# Patient Record
Sex: Female | Born: 1970 | Race: Black or African American | Hispanic: No | Smoking: Never smoker
Health system: Southern US, Community
[De-identification: ages and names within clinical notes are randomized; demographics above are authoritative.]

## PROBLEM LIST (undated history)

## (undated) DIAGNOSIS — F410 Panic disorder [episodic paroxysmal anxiety] without agoraphobia: Secondary | ICD-10-CM

## (undated) DIAGNOSIS — I10 Essential (primary) hypertension: Secondary | ICD-10-CM

## (undated) DIAGNOSIS — D219 Benign neoplasm of connective and other soft tissue, unspecified: Secondary | ICD-10-CM

## (undated) DIAGNOSIS — R87629 Unspecified abnormal cytological findings in specimens from vagina: Secondary | ICD-10-CM

## (undated) DIAGNOSIS — E669 Obesity, unspecified: Secondary | ICD-10-CM

## (undated) DIAGNOSIS — F419 Anxiety disorder, unspecified: Secondary | ICD-10-CM

## (undated) DIAGNOSIS — B999 Unspecified infectious disease: Secondary | ICD-10-CM

## (undated) DIAGNOSIS — E785 Hyperlipidemia, unspecified: Secondary | ICD-10-CM

## (undated) DIAGNOSIS — D649 Anemia, unspecified: Secondary | ICD-10-CM

## (undated) HISTORY — DX: Panic disorder (episodic paroxysmal anxiety): F41.0

## (undated) HISTORY — PX: ABDOMINAL HYSTERECTOMY: SHX81

## (undated) HISTORY — DX: Obesity, unspecified: E66.9

## (undated) HISTORY — DX: Anxiety disorder, unspecified: F41.9

## (undated) HISTORY — DX: Hyperlipidemia, unspecified: E78.5

## (undated) HISTORY — PX: KNEE SURGERY: SHX244

---

## 1998-06-28 ENCOUNTER — Emergency Department (HOSPITAL_COMMUNITY): Admission: EM | Admit: 1998-06-28 | Discharge: 1998-06-28 | Payer: Self-pay | Admitting: Emergency Medicine

## 1998-09-15 ENCOUNTER — Emergency Department (HOSPITAL_COMMUNITY): Admission: EM | Admit: 1998-09-15 | Discharge: 1998-09-15 | Payer: Self-pay | Admitting: Emergency Medicine

## 1998-10-08 ENCOUNTER — Other Ambulatory Visit: Admission: RE | Admit: 1998-10-08 | Discharge: 1998-10-08 | Payer: Self-pay | Admitting: Obstetrics

## 1998-10-27 ENCOUNTER — Inpatient Hospital Stay (HOSPITAL_COMMUNITY): Admission: AD | Admit: 1998-10-27 | Discharge: 1998-10-27 | Payer: Self-pay | Admitting: Obstetrics

## 1998-12-03 ENCOUNTER — Emergency Department (HOSPITAL_COMMUNITY): Admission: EM | Admit: 1998-12-03 | Discharge: 1998-12-03 | Payer: Self-pay

## 1999-04-18 ENCOUNTER — Inpatient Hospital Stay (HOSPITAL_COMMUNITY): Admission: AD | Admit: 1999-04-18 | Discharge: 1999-04-20 | Payer: Self-pay | Admitting: Obstetrics

## 1999-09-09 ENCOUNTER — Emergency Department (HOSPITAL_COMMUNITY): Admission: EM | Admit: 1999-09-09 | Discharge: 1999-09-09 | Payer: Self-pay | Admitting: *Deleted

## 2000-01-05 ENCOUNTER — Emergency Department (HOSPITAL_COMMUNITY): Admission: EM | Admit: 2000-01-05 | Discharge: 2000-01-05 | Payer: Self-pay | Admitting: Internal Medicine

## 2001-05-22 ENCOUNTER — Encounter: Payer: Self-pay | Admitting: Obstetrics

## 2001-05-22 ENCOUNTER — Ambulatory Visit (HOSPITAL_COMMUNITY): Admission: RE | Admit: 2001-05-22 | Discharge: 2001-05-22 | Payer: Self-pay | Admitting: Obstetrics

## 2001-08-14 ENCOUNTER — Ambulatory Visit (HOSPITAL_COMMUNITY): Admission: RE | Admit: 2001-08-14 | Discharge: 2001-08-14 | Payer: Self-pay | Admitting: Obstetrics

## 2001-08-14 ENCOUNTER — Encounter: Payer: Self-pay | Admitting: Obstetrics

## 2001-08-17 ENCOUNTER — Inpatient Hospital Stay (HOSPITAL_COMMUNITY): Admission: AD | Admit: 2001-08-17 | Discharge: 2001-08-19 | Payer: Self-pay | Admitting: Obstetrics

## 2002-07-05 ENCOUNTER — Emergency Department (HOSPITAL_COMMUNITY): Admission: EM | Admit: 2002-07-05 | Discharge: 2002-07-05 | Payer: Self-pay | Admitting: Emergency Medicine

## 2003-07-11 ENCOUNTER — Emergency Department (HOSPITAL_COMMUNITY): Admission: EM | Admit: 2003-07-11 | Discharge: 2003-07-11 | Payer: Self-pay | Admitting: Emergency Medicine

## 2006-01-18 ENCOUNTER — Emergency Department (HOSPITAL_COMMUNITY): Admission: EM | Admit: 2006-01-18 | Discharge: 2006-01-18 | Payer: Self-pay | Admitting: Emergency Medicine

## 2006-05-08 ENCOUNTER — Emergency Department (HOSPITAL_COMMUNITY): Admission: EM | Admit: 2006-05-08 | Discharge: 2006-05-08 | Payer: Self-pay | Admitting: Emergency Medicine

## 2006-08-10 ENCOUNTER — Emergency Department (HOSPITAL_COMMUNITY): Admission: EM | Admit: 2006-08-10 | Discharge: 2006-08-10 | Payer: Self-pay | Admitting: Emergency Medicine

## 2006-09-30 ENCOUNTER — Inpatient Hospital Stay (HOSPITAL_COMMUNITY): Admission: EM | Admit: 2006-09-30 | Discharge: 2006-10-02 | Payer: Self-pay | Admitting: Emergency Medicine

## 2007-02-13 ENCOUNTER — Emergency Department (HOSPITAL_COMMUNITY): Admission: EM | Admit: 2007-02-13 | Discharge: 2007-02-13 | Payer: Self-pay | Admitting: Emergency Medicine

## 2007-06-13 ENCOUNTER — Emergency Department (HOSPITAL_COMMUNITY): Admission: EM | Admit: 2007-06-13 | Discharge: 2007-06-13 | Payer: Self-pay | Admitting: Family Medicine

## 2007-10-24 ENCOUNTER — Emergency Department (HOSPITAL_COMMUNITY): Admission: EM | Admit: 2007-10-24 | Discharge: 2007-10-24 | Payer: Self-pay | Admitting: Emergency Medicine

## 2007-10-30 ENCOUNTER — Emergency Department (HOSPITAL_COMMUNITY): Admission: EM | Admit: 2007-10-30 | Discharge: 2007-10-30 | Payer: Self-pay | Admitting: Emergency Medicine

## 2007-12-01 ENCOUNTER — Emergency Department (HOSPITAL_COMMUNITY): Admission: EM | Admit: 2007-12-01 | Discharge: 2007-12-01 | Payer: Self-pay | Admitting: Emergency Medicine

## 2008-03-09 IMAGING — CR DG KNEE COMPLETE 4+V*R*
4 series · 4 of 4 positions shown · non-contrast
Comparison: None.

Exam: Right knee, 4 views.

HISTORY: Right knee pain.

[t knee ap right *]
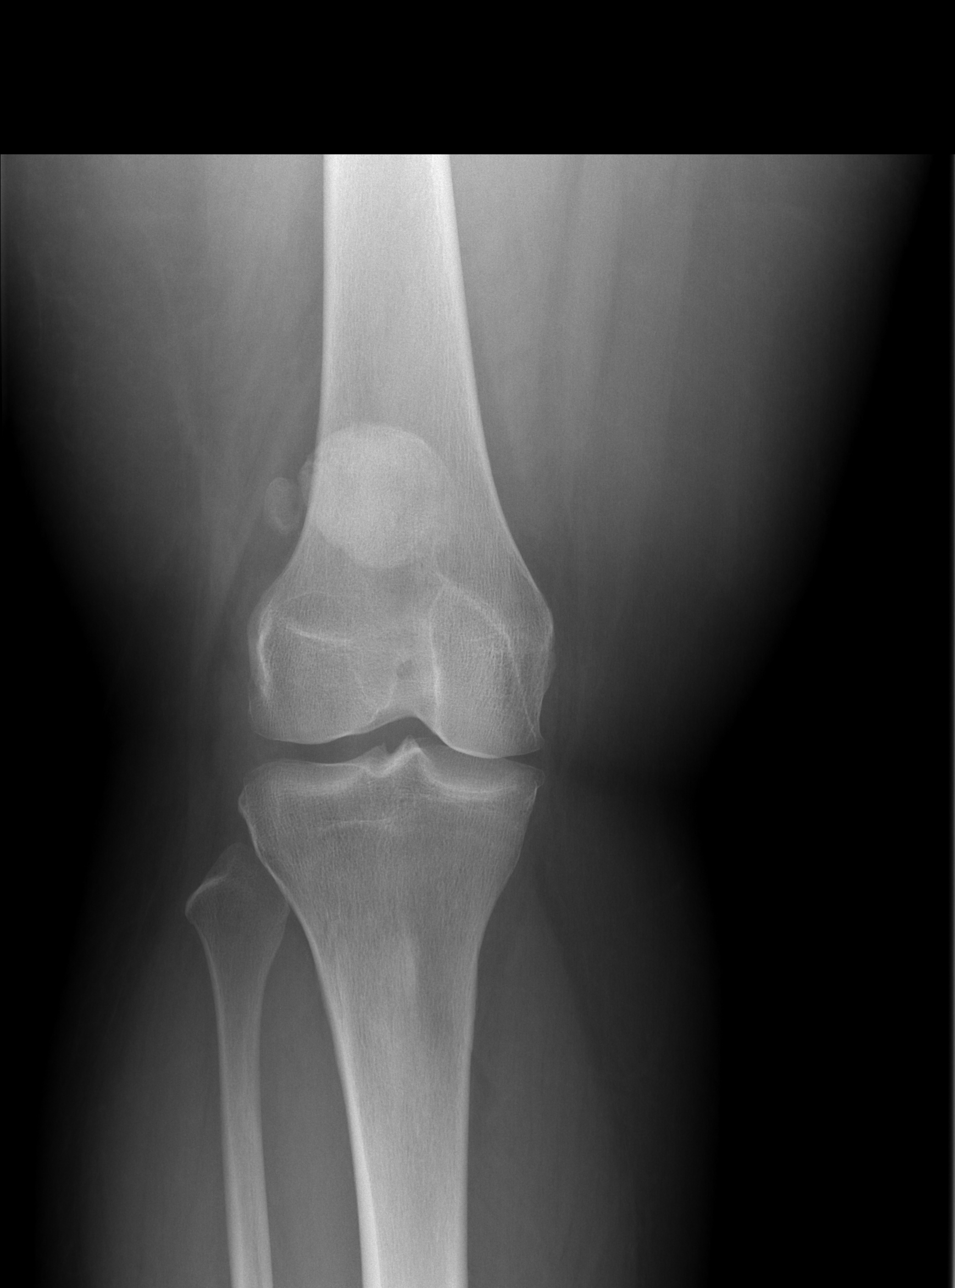

[t knee oblique right * (1 of 2)]
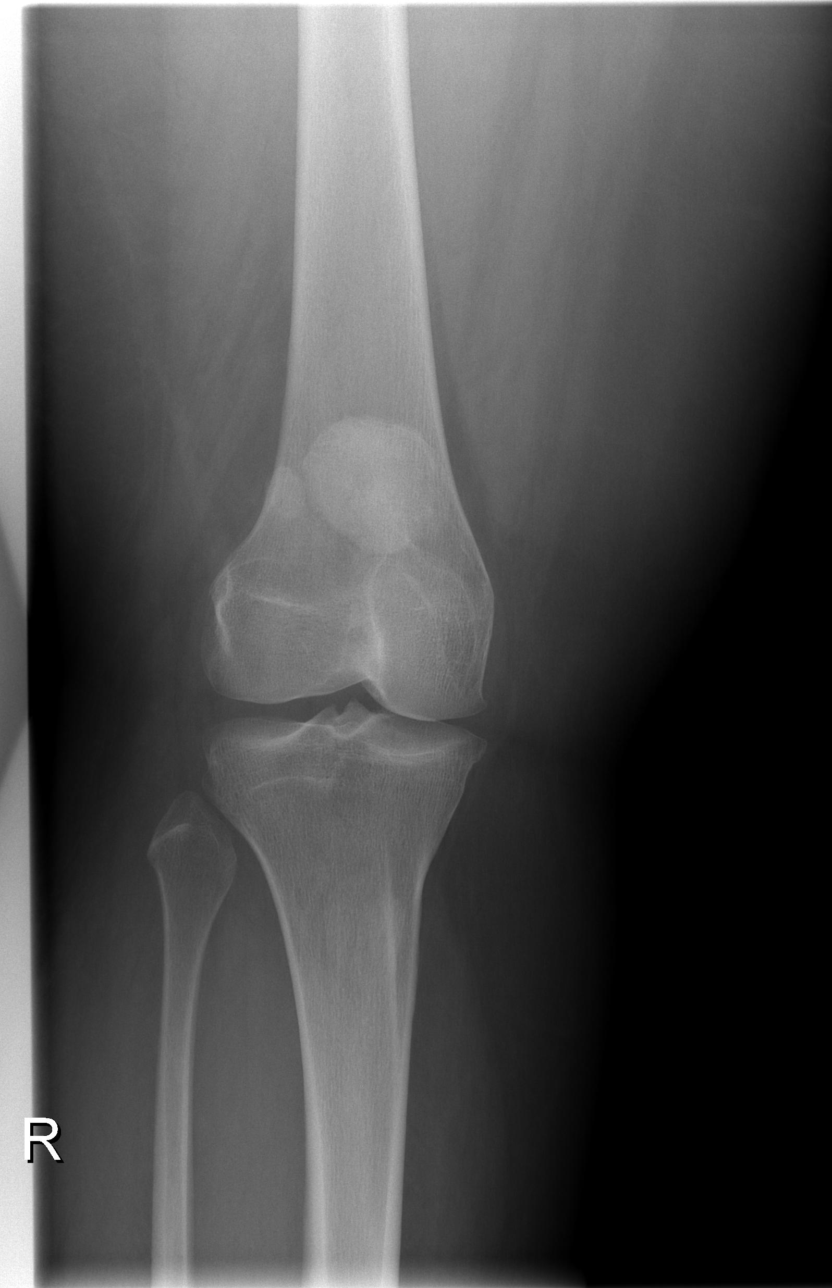

[t knee oblique right * (2 of 2)]
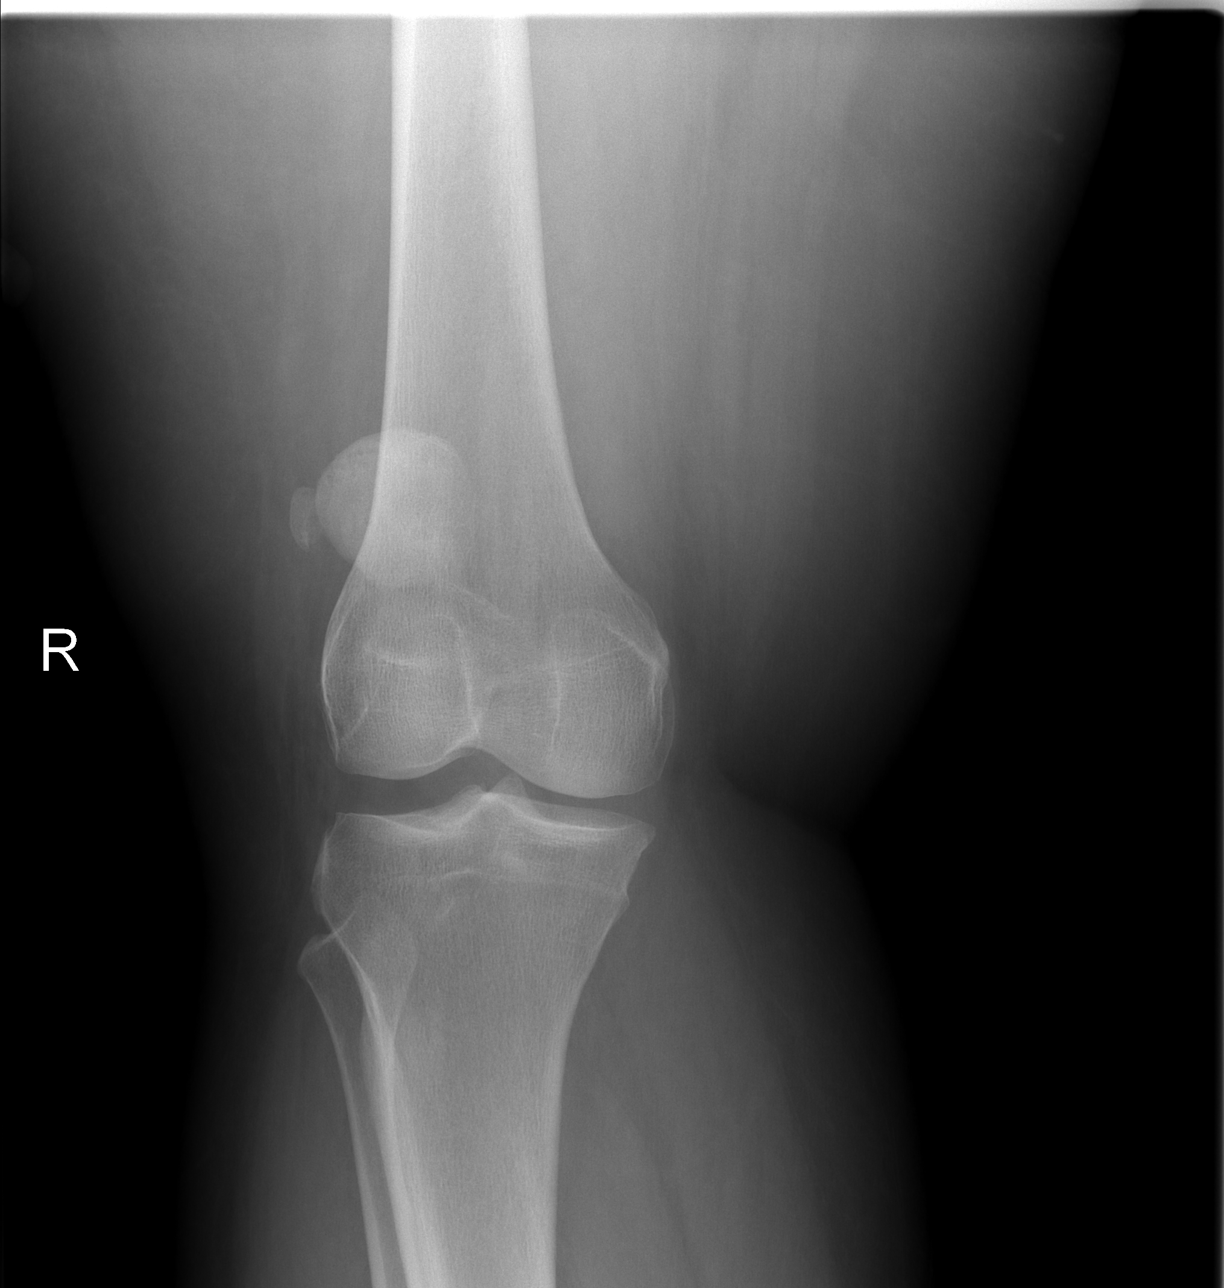

[view not recorded]
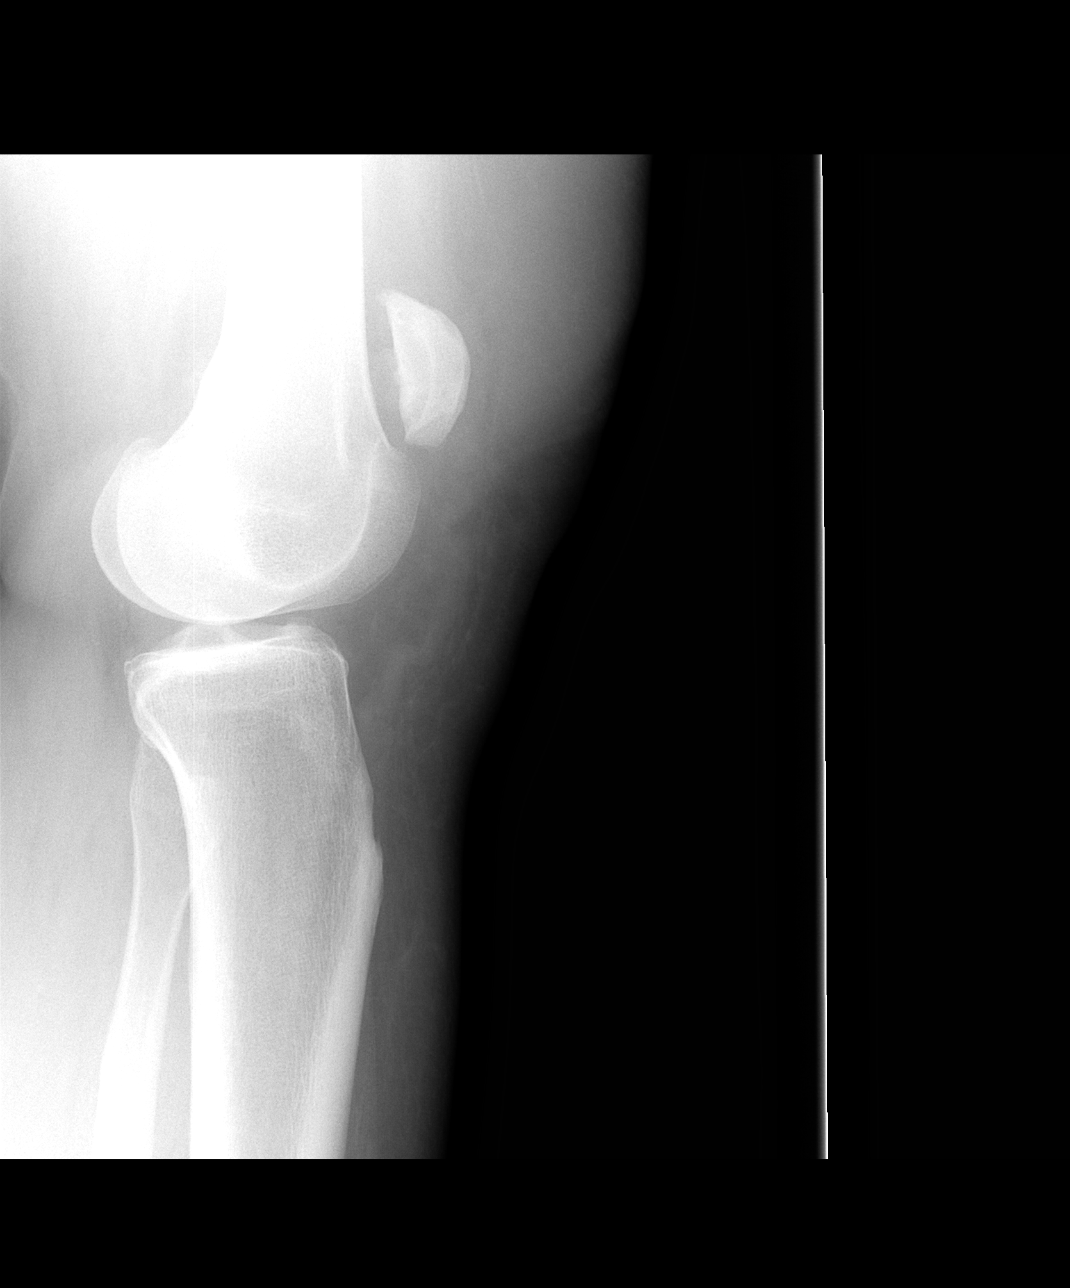

[4 of 4 positions shown; findings below may reference images not displayed]

FINDINGS: Patellar tendon appears disrupted. The patella is superiorly displaced.

No acute fractures or dislocations noted.

There is a bipartite patella.
IMPRESSION: 1. Findings are suspicious for a patellar tendon rupture.
2. No fractures.
3. Bipartite patella.

## 2008-10-14 ENCOUNTER — Encounter: Admission: RE | Admit: 2008-10-14 | Discharge: 2008-10-14 | Payer: Self-pay | Admitting: Orthopedic Surgery

## 2008-10-30 ENCOUNTER — Encounter: Admission: RE | Admit: 2008-10-30 | Discharge: 2009-01-28 | Payer: Self-pay | Admitting: Orthopedic Surgery

## 2009-07-01 ENCOUNTER — Encounter: Admission: RE | Admit: 2009-07-01 | Discharge: 2009-07-01 | Payer: Self-pay | Admitting: Orthopedic Surgery

## 2010-03-06 ENCOUNTER — Emergency Department (HOSPITAL_COMMUNITY): Admission: EM | Admit: 2010-03-06 | Discharge: 2010-03-06 | Payer: Self-pay | Admitting: Emergency Medicine

## 2010-03-09 ENCOUNTER — Emergency Department (HOSPITAL_COMMUNITY): Admission: EM | Admit: 2010-03-09 | Discharge: 2010-03-09 | Payer: Self-pay | Admitting: Emergency Medicine

## 2010-07-17 ENCOUNTER — Emergency Department (HOSPITAL_COMMUNITY)
Admission: EM | Admit: 2010-07-17 | Discharge: 2010-07-17 | Payer: Self-pay | Source: Home / Self Care | Admitting: Emergency Medicine

## 2010-07-20 LAB — DIFFERENTIAL
Basophils Relative: 0 % (ref 0–1)
Eosinophils Relative: 1 % (ref 0–5)
Monocytes Absolute: 0.8 10*3/uL (ref 0.1–1.0)
Neutrophils Relative %: 53 % (ref 43–77)

## 2010-07-20 LAB — CBC
HCT: 32.6 % — ABNORMAL LOW (ref 36.0–46.0)
Hemoglobin: 9.3 g/dL — ABNORMAL LOW (ref 12.0–15.0)
RBC: 5.23 MIL/uL — ABNORMAL HIGH (ref 3.87–5.11)
WBC: 10.2 10*3/uL (ref 4.0–10.5)

## 2010-08-19 ENCOUNTER — Emergency Department (HOSPITAL_COMMUNITY)
Admission: EM | Admit: 2010-08-19 | Discharge: 2010-08-19 | Disposition: A | Discharge status: Home / Self Care | Payer: Self-pay | Attending: Emergency Medicine | Admitting: Emergency Medicine

## 2010-08-19 DIAGNOSIS — R197 Diarrhea, unspecified: Secondary | ICD-10-CM | POA: Insufficient documentation

## 2010-08-19 DIAGNOSIS — R630 Anorexia: Secondary | ICD-10-CM | POA: Insufficient documentation

## 2010-08-19 DIAGNOSIS — R110 Nausea: Secondary | ICD-10-CM | POA: Insufficient documentation

## 2010-08-19 DIAGNOSIS — Z79899 Other long term (current) drug therapy: Secondary | ICD-10-CM | POA: Insufficient documentation

## 2010-08-19 DIAGNOSIS — R6889 Other general symptoms and signs: Secondary | ICD-10-CM | POA: Insufficient documentation

## 2010-08-19 DIAGNOSIS — R509 Fever, unspecified: Secondary | ICD-10-CM | POA: Insufficient documentation

## 2010-08-19 DIAGNOSIS — J3489 Other specified disorders of nose and nasal sinuses: Secondary | ICD-10-CM | POA: Insufficient documentation

## 2010-08-19 DIAGNOSIS — E119 Type 2 diabetes mellitus without complications: Secondary | ICD-10-CM | POA: Insufficient documentation

## 2010-08-19 DIAGNOSIS — R51 Headache: Secondary | ICD-10-CM | POA: Insufficient documentation

## 2010-08-19 DIAGNOSIS — IMO0001 Reserved for inherently not codable concepts without codable children: Secondary | ICD-10-CM | POA: Insufficient documentation

## 2010-08-19 LAB — CBC
HCT: 33.2 % — ABNORMAL LOW (ref 36.0–46.0)
Hemoglobin: 9.1 g/dL — ABNORMAL LOW (ref 12.0–15.0)
RDW: 19.6 % — ABNORMAL HIGH (ref 11.5–15.5)
WBC: 7.4 10*3/uL (ref 4.0–10.5)

## 2010-08-19 LAB — URINALYSIS, ROUTINE W REFLEX MICROSCOPIC
Hgb urine dipstick: NEGATIVE
Specific Gravity, Urine: 1.024 (ref 1.005–1.030)
Urine Glucose, Fasting: NEGATIVE mg/dL

## 2010-08-19 LAB — DIFFERENTIAL
Basophils Absolute: 0.1 10*3/uL (ref 0.0–0.1)
Eosinophils Relative: 2 % (ref 0–5)
Monocytes Absolute: 0.8 10*3/uL (ref 0.1–1.0)
Monocytes Relative: 11 % (ref 3–12)
Neutrophils Relative %: 48 % (ref 43–77)

## 2010-08-19 LAB — POCT I-STAT, CHEM 8
BUN: 8 mg/dL (ref 6–23)
Chloride: 105 mEq/L (ref 96–112)
Creatinine, Ser: 0.6 mg/dL (ref 0.4–1.2)
Potassium: 4.1 mEq/L (ref 3.5–5.1)
Sodium: 140 mEq/L (ref 135–145)

## 2010-08-19 LAB — LIPASE, BLOOD: Lipase: 39 U/L (ref 11–59)

## 2010-09-09 LAB — URINALYSIS, ROUTINE W REFLEX MICROSCOPIC
Bilirubin Urine: NEGATIVE
Glucose, UA: NEGATIVE mg/dL
Ketones, ur: NEGATIVE mg/dL
Nitrite: NEGATIVE
Nitrite: NEGATIVE
Protein, ur: NEGATIVE mg/dL
Protein, ur: NEGATIVE mg/dL
Urobilinogen, UA: 1 mg/dL (ref 0.0–1.0)
pH: 6.5 (ref 5.0–8.0)

## 2010-09-09 LAB — GLUCOSE, CAPILLARY: Glucose-Capillary: 174 mg/dL — ABNORMAL HIGH (ref 70–99)

## 2010-09-09 LAB — WET PREP, GENITAL
Clue Cells Wet Prep HPF POC: NONE SEEN
Trich, Wet Prep: NONE SEEN

## 2010-09-09 LAB — URINE CULTURE: Colony Count: 40000

## 2010-09-09 LAB — GC/CHLAMYDIA PROBE AMP, GENITAL

## 2010-11-12 NOTE — Op Note (Signed)
NAMECARRIEANN, SPIELBERG NO.:  [id number]   MEDICAL RECORD NO.:  [id number]          PATIENT TYPE:  INP   LOCATION:  1519                         FACILITY:  [facility]   PHYSICIAN:  Myrtie Neither, MD      DATE OF BIRTH:  [birth year]   DATE OF PROCEDURE:  10/01/2006  DATE OF DISCHARGE:                               OPERATIVE REPORT   PREOP DIAGNOSIS:  Patellar tendon tear, right knee.   POSTOPERATIVE DIAGNOSES:  1. Patellar tendon tear, right knee.  2. Tear of the retinaculum.   ANESTHESIA:  General.   PROCEDURE:  Repair of patellar tendon and repair of retinaculum, right  knee.   The patient was taken to the operating room, given adequate preop  medication, given general anesthesia, was intubated.  The right lower  extremity was prepped with DuraPrep and draped in a sterile manner.  A  tourniquet and Bovie used for hemostasis.  An anterior midline incision  was made over the right knee going through the skin and subcutaneous  tissue down to the patellar tendon.  Sharp and blunt dissection was made  both medial and laterally.  Retinaculum was split both medial and  laterally.  The patellar tendon was shredded mid substance.  Irrigation  was done.  Inspection of the knee reveals the ACL is intact, articular  surface was also well preserved.  A #5 FiberWire was used in a Bunnell  type of suture, one proximally and one distally.  A #2 FiberWire was  used to reapproximate the retinaculum both medially and laterally.  Zero  Vicryl was used to approximate the patellar tendon sheath over the  repaired tendon.  The #2-0 was used for the subcutaneous tissue and the  skin staples for the skin.  A compressive dressing was applied, knee  immobilizer applied.  The patient tolerated the procedure quite well,  went to recovery room in stable and satisfactory condition.      Myrtie Neither, MD  Electronically Signed     AC/MEDQ  D:  10/01/2006  T:  10/01/2006  Job:  [id number]

## 2010-11-12 NOTE — H&P (Signed)
NAMEHAYA, [name] NO.:  [id number]   MEDICAL RECORD NO.:  [id number]          PATIENT TYPE:  INP   LOCATION:  1519                         FACILITY:  [facility]   PHYSICIAN:  Myrtie Neither, MD      DATE OF BIRTH:  Jul 09, 1970   DATE OF ADMISSION:  09/30/2006  DATE OF DISCHARGE:                              HISTORY & PHYSICAL   CHIEF COMPLAINT:  Painful swollen right knee.   PERTINENT HISTORY AND PHYSICAL:  This is a 40 year old black female who  states that she lost her footing and slipped and fell injuring her right  knee earlier today.  The patient states that she was unable to lift the  right leg up and developed severe pain and swelling.  The patient came  to [facility] Emergency Room for treatment.  The patient denies any  other injuries or loss of consciousness.   PAST MEDICAL HISTORY:  1. Left knee surgery in the past.  2. History of high blood pressure.  3. Diabetes.   ALLERGIES:  None known.   MEDICATIONS:  None.   FAMILY HISTORY:  Noncontributory.   SOCIAL HISTORY:  Denies use of alcohol, drugs or tobacco.   REVIEW OF SYSTEMS:  The patient has been doing quite well other than a  weight problem.  No cardiac, respiratory, no urinary or bowel symptoms.   PHYSICAL EXAMINATION:  Alert and oriented, no acute distress.  Blood  pressure 163/94, pulse 112, respirations 24, temperature 100.2, O2  saturation 98%.  HEAD:  Normocephalic.  EYES:  Conjunctiva, sclera clear.  NECK:  Supple.  CHEST:  Clear.  CARDIAC:  S1, S2 regular.  ABDOMEN:  Soft, active bowel sounds.  EXTREMITY:  Right knee diffusely swollen, tender, palpable defect at the  patellar tendon junction.  The patient is unable to extend the right  knee.  Neurovascular status is intact.  Left knee old anterior  incisional scar.   IMPRESSION:  Patellar tendon tear right knee.   PLAN:  Repair right patellar tendon.      Myrtie Neither, MD  Electronically Signed     AC/MEDQ  D:   10/01/2006  T:  10/01/2006  Job:  [id number]

## 2010-11-12 NOTE — Discharge Summary (Signed)
NAMEANTONELA, FREIMAN NO.:  [id number]   MEDICAL RECORD NO.:  [id number]          PATIENT TYPE:  INP   LOCATION:  1519                         FACILITY:  [facility]   PHYSICIAN:  Myrtie Neither, MD      DATE OF BIRTH:  Jun 12, 1971   DATE OF ADMISSION:  09/30/2006  DATE OF DISCHARGE:  10/02/2006                               DISCHARGE SUMMARY   ADMISSION DIAGNOSES:  Patellar tendon tear right knee, morbid obesity.   DISCHARGE DIAGNOSES:  Patellar tendon tear right knee, morbid obesity.   COMPLICATIONS:  None.   INFECTIONS:  None.   OPERATION:  Patellar tendon repair right knee on October 01, 2006.   PERTINENT HISTORY:  This is a 40 year old female who lost her footing  and slipped and fell and sustained an injury to her right knee on September 30, 2006.  The patient complained of pain and swelling of the right knee.  The patient came to [facility] for treatment.   Pertinent physical exam of the right lower extremity is diffusely  swollen, tender with a palpable defect at the patellar tendon junction.  The patient is unable to extend the right knee.  Neurovascular status is  intact.   HOSPITAL COURSE:  The patient had preop laboratory, CBC, EKG, chest x-  ray, UA, C-MET.  Patient's laboratory tests were stable enough to  undergo surgery.  Patient with right patellar tendon repair.  Tolerated  the procedure quite well.  Postop course was relatively benign.  Started  on ice packs, elevation, partial weightbearing on the right side with  the use of a walker.  Patient became stable enough with pain under  control and was able to be discharged on Vicodin ES 1-2 q.4 p.r.n. for  pain, partial weightbearing on the right leg with use of walker, return  to office in one week.  The patient is discharge in stable and  satisfactory condition.      Myrtie Neither, MD  Electronically Signed     AC/MEDQ  D:  10/19/2006  T:  10/19/2006  Job:  [id number]

## 2010-12-10 ENCOUNTER — Emergency Department (HOSPITAL_COMMUNITY)
Admission: EM | Admit: 2010-12-10 | Discharge: 2010-12-10 | Disposition: A | Discharge status: Home / Self Care | Payer: Self-pay | Attending: Emergency Medicine | Admitting: Emergency Medicine

## 2010-12-10 DIAGNOSIS — E119 Type 2 diabetes mellitus without complications: Secondary | ICD-10-CM | POA: Insufficient documentation

## 2010-12-10 DIAGNOSIS — N390 Urinary tract infection, site not specified: Secondary | ICD-10-CM | POA: Insufficient documentation

## 2010-12-10 LAB — URINALYSIS, ROUTINE W REFLEX MICROSCOPIC
Glucose, UA: NEGATIVE mg/dL
Protein, ur: 30 mg/dL — AB
Specific Gravity, Urine: 1.031 — ABNORMAL HIGH (ref 1.005–1.030)

## 2010-12-10 LAB — PREGNANCY, URINE: Preg Test, Ur: NEGATIVE

## 2010-12-10 LAB — URINE MICROSCOPIC-ADD ON

## 2010-12-11 LAB — URINE CULTURE
Colony Count: 85000
Culture  Setup Time: 201206160118

## 2011-03-13 ENCOUNTER — Emergency Department (HOSPITAL_COMMUNITY)
Admission: EM | Admit: 2011-03-13 | Discharge: 2011-03-13 | Disposition: A | Discharge status: Home / Self Care | Payer: Self-pay | Attending: Emergency Medicine | Admitting: Emergency Medicine

## 2011-03-13 DIAGNOSIS — E119 Type 2 diabetes mellitus without complications: Secondary | ICD-10-CM | POA: Insufficient documentation

## 2011-03-13 DIAGNOSIS — R300 Dysuria: Secondary | ICD-10-CM | POA: Insufficient documentation

## 2011-03-13 DIAGNOSIS — N390 Urinary tract infection, site not specified: Secondary | ICD-10-CM | POA: Insufficient documentation

## 2011-03-13 LAB — URINALYSIS, ROUTINE W REFLEX MICROSCOPIC
Bilirubin Urine: NEGATIVE
Ketones, ur: NEGATIVE mg/dL
Nitrite: NEGATIVE
Specific Gravity, Urine: 1.02 (ref 1.005–1.030)
Urobilinogen, UA: 0.2 mg/dL (ref 0.0–1.0)
pH: 5 (ref 5.0–8.0)

## 2011-03-13 LAB — POCT PREGNANCY, URINE: Preg Test, Ur: NEGATIVE

## 2011-03-22 LAB — POCT URINALYSIS DIP (DEVICE)
Hgb urine dipstick: NEGATIVE
Operator id: 239701
Protein, ur: 30 — AB
Specific Gravity, Urine: >=1.03
Urobilinogen, UA: 0.2
pH: 5.5

## 2011-04-01 LAB — POCT URINALYSIS DIP (DEVICE)
Bilirubin Urine: NEGATIVE
Ketones, ur: NEGATIVE
Specific Gravity, Urine: >=1.03
pH: 6.5

## 2011-04-08 LAB — POCT PREGNANCY, URINE: Operator id: 235561

## 2011-04-08 LAB — URINE CULTURE

## 2011-04-08 LAB — POCT URINALYSIS DIP (DEVICE)
Glucose, UA: NEGATIVE
Ketones, ur: NEGATIVE
Operator id: 235561
Specific Gravity, Urine: >=1.03

## 2011-05-28 ENCOUNTER — Emergency Department (HOSPITAL_COMMUNITY)
Admission: EM | Admit: 2011-05-28 | Discharge: 2011-05-28 | Disposition: A | Discharge status: Home / Self Care | Payer: 59 | Attending: Emergency Medicine | Admitting: Emergency Medicine

## 2011-05-28 ENCOUNTER — Encounter: Payer: Self-pay | Admitting: Emergency Medicine

## 2011-05-28 DIAGNOSIS — E119 Type 2 diabetes mellitus without complications: Secondary | ICD-10-CM | POA: Insufficient documentation

## 2011-05-28 DIAGNOSIS — N390 Urinary tract infection, site not specified: Secondary | ICD-10-CM

## 2011-05-28 DIAGNOSIS — R300 Dysuria: Secondary | ICD-10-CM | POA: Insufficient documentation

## 2011-05-28 LAB — URINALYSIS, ROUTINE W REFLEX MICROSCOPIC
Bilirubin Urine: NEGATIVE
Glucose, UA: NEGATIVE mg/dL
Hgb urine dipstick: NEGATIVE
Protein, ur: NEGATIVE mg/dL
Specific Gravity, Urine: 1.03 (ref 1.005–1.030)
Urobilinogen, UA: 0.2 mg/dL (ref 0.0–1.0)

## 2011-05-28 LAB — URINE MICROSCOPIC-ADD ON

## 2011-05-28 MED ORDER — PHENAZOPYRIDINE HCL 200 MG PO TABS: 200.0000 mg | ORAL_TABLET | Freq: Three times a day (TID) | ORAL | Status: AC

## 2011-05-28 MED ORDER — NITROFURANTOIN MONOHYD MACRO 100 MG PO CAPS: 100.0000 mg | ORAL_CAPSULE | Freq: Two times a day (BID) | ORAL | Status: AC

## 2011-05-28 NOTE — ED Notes (Signed)
Per pt s/s started early this week. Tenderness in the beginning and now noted burning, itching difficulty urinating, pressure,mild vaginal d/c noted as well,pt reports having frequent UTI's

## 2011-05-28 NOTE — ED Notes (Signed)
ZOX:[id number]<VW> Expected date:05/28/11<BR> Expected time: 6:42 PM<BR> Means of arrival:Ambulance<BR> Comments:<BR> Pt to see Dr Derrell Lolling

## 2011-05-28 NOTE — ED Provider Notes (Signed)
History     CSN: [id number] Arrival date & time: 05/28/2011  6:16 PM   First MD Initiated Contact with Patient 05/28/11 2217      Chief Complaint  Patient presents with  . Urinary Tract Infection    (Consider location/radiation/quality/duration/timing/severity/associated sxs/prior treatment) Patient is a 40 y.o. female presenting with urinary tract infection. The history is provided by the patient.  Urinary Tract Infection This is a new problem. The current episode started yesterday. The problem occurs constantly. Pertinent negatives include no abdominal pain, chills or fever. Associated symptoms comments: Dysuria, frequency for several days similar to multiple previous UTI's. No fever, nausea or abdominal pain..    Past Medical History  Diagnosis Date  . Diabetes mellitus     Past Surgical History  Procedure Date  . Knee surgery     History reviewed. No pertinent family history.  History  Substance Use Topics  . Smoking status: Never Smoker   . Smokeless tobacco: Not on file  . Alcohol Use: No    OB History    Grav Para Term Preterm Abortions TAB SAB Ect Mult Living                  Review of Systems  Constitutional: Negative for fever and chills.  HENT: Negative.   Respiratory: Negative.   Cardiovascular: Negative.   Gastrointestinal: Negative.  Negative for abdominal pain.  Genitourinary: Positive for dysuria and frequency. Negative for flank pain and vaginal discharge.  Musculoskeletal: Negative.        See HPI.  Skin: Negative.   Neurological: Negative.     Allergies  Percocet  Home Medications   Current Outpatient Rx  Name Route Sig Dispense Refill  . METFORMIN HCL 500 MG PO TABS Oral Take 500 mg by mouth 2 (two) times daily with a meal.      . NAPROXEN 500 MG PO TABS Oral Take 500 mg by mouth 2 (two) times daily with a meal.        BP 138/93  Pulse 103  Temp(Src) 97.9 F (36.6 C) (Oral)  SpO2 100%  Physical Exam  Constitutional: She is  oriented to person, place, and time. She appears well-developed and well-nourished.  Neck: Normal range of motion.  Pulmonary/Chest: Effort normal.  Abdominal: Soft. There is no tenderness. There is no rebound and no guarding.  Neurological: She is alert and oriented to person, place, and time.  Skin: Skin is warm and dry.    ED Course  Procedures (including critical care time)  Labs Reviewed  GLUCOSE, CAPILLARY - Abnormal; Notable for the following:    Glucose-Capillary 126 (*)    All other components within normal limits  URINALYSIS, ROUTINE W REFLEX MICROSCOPIC - Abnormal; Notable for the following:    APPearance CLOUDY (*)    Leukocytes, UA MODERATE (*)    All other components within normal limits  URINE MICROSCOPIC-ADD ON - Abnormal; Notable for the following:    Bacteria, UA MANY (*)    All other components within normal limits  POCT CBG MONITORING  URINE CULTURE   No results found.   No diagnosis found.    MDM          Rodena Medin, PA 05/28/11 2248

## 2011-05-29 NOTE — ED Provider Notes (Signed)
Medical screening examination/treatment/procedure(s) were performed by non-physician practitioner and as supervising physician I was immediately available for consultation/collaboration.  Donnetta Hutching, MD 05/29/11 1806

## 2011-05-30 LAB — URINE CULTURE: Colony Count: 35000

## 2011-10-26 ENCOUNTER — Ambulatory Visit (INDEPENDENT_AMBULATORY_CARE_PROVIDER_SITE_OTHER): Payer: BC Managed Care – PPO | Admitting: Emergency Medicine

## 2011-10-26 VITALS — BP 115/76 | HR 128 | Temp 97.3°F | Resp 18 | Ht 62.5 in | Wt 273.0 lb

## 2011-10-26 DIAGNOSIS — N390 Urinary tract infection, site not specified: Secondary | ICD-10-CM

## 2011-10-26 DIAGNOSIS — R350 Frequency of micturition: Secondary | ICD-10-CM

## 2011-10-26 DIAGNOSIS — R300 Dysuria: Secondary | ICD-10-CM

## 2011-10-26 DIAGNOSIS — E119 Type 2 diabetes mellitus without complications: Secondary | ICD-10-CM

## 2011-10-26 LAB — POCT UA - MICROSCOPIC ONLY
Casts, Ur, LPF, POC: NEGATIVE
Crystals, Ur, HPF, POC: NEGATIVE
Mucus, UA: NEGATIVE
Yeast, UA: NEGATIVE

## 2011-10-26 LAB — POCT URINALYSIS DIPSTICK
Glucose, UA: NEGATIVE
Nitrite, UA: NEGATIVE
Spec Grav, UA: 1.025
Urobilinogen, UA: 0.2
pH, UA: 5.5

## 2011-10-26 MED ORDER — CIPROFLOXACIN HCL 500 MG PO TABS: 500.0000 mg | ORAL_TABLET | Freq: Two times a day (BID) | ORAL | Status: AC

## 2011-10-26 NOTE — Progress Notes (Signed)
  Subjective:    Patient ID: [name], female    DOB: 1971/02/21, 41 y.o.   MRN: [id number]  HPI patient enters or urinary frequency or burning on urination she has a history of urinary tract infection 2 months ago. That infection was treated with Cipro with good results she does have underlying diabetes mellitus which is under good control. Her blood sugars run around 120 to    Review of Systems no fevers chills or back pain .     Objective:   Physical Exam there is no CVA tenderness. The abdomen is soft nontender  Results for orders placed in visit on 10/26/11  POCT UA - MICROSCOPIC ONLY      Component Value Range   WBC, Ur, HPF, POC 2-5     RBC, urine, microscopic 0-1     Bacteria, U Microscopic 1+     Mucus, UA neg     Epithelial cells, urine per micros 0-5     Crystals, Ur, HPF, POC neg     Casts, Ur, LPF, POC neg     Yeast, UA neg    POCT URINALYSIS DIPSTICK      Component Value Range   Color, UA yellow     Clarity, UA sl. cloudy     Glucose, UA neg     Bilirubin, UA neg     Ketones, UA neg     Spec Grav, UA 1.025     Blood, UA small     pH, UA 5.5     Protein, UA trace     Urobilinogen, UA 0.2     Nitrite, UA neg     Leukocytes, UA small (1+)          Assessment & Plan:

## 2011-10-26 NOTE — Patient Instructions (Signed)

## 2011-10-28 LAB — URINE CULTURE

## 2011-11-16 ENCOUNTER — Ambulatory Visit (INDEPENDENT_AMBULATORY_CARE_PROVIDER_SITE_OTHER): Payer: BC Managed Care – PPO | Admitting: Family Medicine

## 2011-11-16 VITALS — BP 130/88 | HR 110 | Temp 98.5°F | Resp 16 | Ht 62.5 in | Wt 274.0 lb

## 2011-11-16 DIAGNOSIS — N898 Other specified noninflammatory disorders of vagina: Secondary | ICD-10-CM

## 2011-11-16 DIAGNOSIS — J029 Acute pharyngitis, unspecified: Secondary | ICD-10-CM

## 2011-11-16 DIAGNOSIS — N390 Urinary tract infection, site not specified: Secondary | ICD-10-CM

## 2011-11-16 DIAGNOSIS — R300 Dysuria: Secondary | ICD-10-CM

## 2011-11-16 LAB — POCT UA - MICROSCOPIC ONLY
Casts, Ur, LPF, POC: NEGATIVE
Crystals, Ur, HPF, POC: NEGATIVE
Mucus, UA: NEGATIVE
Yeast, UA: NEGATIVE

## 2011-11-16 LAB — POCT URINALYSIS DIPSTICK
Blood, UA: NEGATIVE
Glucose, UA: NEGATIVE
Nitrite, UA: NEGATIVE
Spec Grav, UA: >=1.03
Urobilinogen, UA: 0.2
pH, UA: 6

## 2011-11-16 LAB — POCT WET PREP WITH KOH: Clue Cells Wet Prep HPF POC: 50

## 2011-11-16 LAB — POCT RAPID STREP A (OFFICE): Rapid Strep A Screen: NEGATIVE

## 2011-11-16 MED ORDER — FLUCONAZOLE 150 MG PO TABS: 150.0000 mg | ORAL_TABLET | Freq: Once | ORAL | Status: AC

## 2011-11-16 MED ORDER — METRONIDAZOLE 500 MG PO TABS: 500.0000 mg | ORAL_TABLET | Freq: Two times a day (BID) | ORAL | Status: AC

## 2011-11-16 MED ORDER — NITROFURANTOIN MONOHYD MACRO 100 MG PO CAPS: 100.0000 mg | ORAL_CAPSULE | Freq: Two times a day (BID) | ORAL | Status: AC

## 2011-11-16 NOTE — Progress Notes (Signed)
Patient Name: [name] Date of Birth: Apr 10, 1971 Medical Record Number: [id number] Gender: female Date of Encounter: 11/16/2011  History of Present Illness:  [name] is a 41 y.o. very pleasant female patient who presents with the following:  Here at the beginning of the month and was treated with cipro for presumed UTI.  However, her vaginal itching has persisted.  She used monistat after she finished her cipro but her symptoms are still persistent.  She also still has some urinary discomfort.   Also she noted a cough for one day and her throat is scratchy.  No fever.  No GI symptoms.  She does have a runny nose, feels achy.    LMP about 2 months ago- notes that she is peri- menopausal  Patient Active Problem List  Diagnoses  . Diabetes mellitus   Past Medical History  Diagnosis Date  . Diabetes mellitus    Past Surgical History  Procedure Date  . Knee surgery    History  Substance Use Topics  . Smoking status: Never Smoker   . Smokeless tobacco: Not on file  . Alcohol Use: No   No family history on file. Allergies  Allergen Reactions  . Percocet (Oxycodone-Acetaminophen) Itching    Medication list has been reviewed and updated.  Review of Systems: As per HPI- otherwise negative. Married, no other sexual partners  Physical Examination: Filed Vitals:   11/16/11 1436  BP: 130/88  Pulse: 110  Temp: 98.5 F (36.9 C)  TempSrc: Oral  Resp: 16  Height: 5' 2.5" (1.588 m)  Weight: 274 lb (124.286 kg)   Recheck pulse- 100 BPM Body mass index is 49.32 kg/(m^2).  GEN: WDWN, NAD, Non-toxic, A & O x 3, morbid obesity HEENT: Atraumatic, Normocephalic. Neck supple. No masses, No LAD.  Tm and oropharynx wnl Ears and Nose: No external deformity. CV: RRR, No M/G/R. No JVD. No thrill. No extra heart sounds. PULM: CTA B, no wheezes, crackles, rhonchi. No retractions. No resp. distress. No accessory muscle use. ABD: S, NT, ND, +BS. No rebound. No  HSM. EXTR: No c/c/e NEURO Normal gait.  PSYCH: Normally interactive. Conversant. Not depressed or anxious appearing.  Calm demeanor.  GU: normal external exam.  Vaginal canal appears slightly irritated, no CMT.    Results for orders placed in visit on 11/16/11  POCT UA - MICROSCOPIC ONLY      Component Value Range   WBC, Ur, HPF, POC tntc     RBC, urine, microscopic 0-1     Bacteria, U Microscopic 3+     Mucus, UA neg     Epithelial cells, urine per micros 5-10     Crystals, Ur, HPF, POC neg     Casts, Ur, LPF, POC neg     Yeast, UA neg    POCT URINALYSIS DIPSTICK      Component Value Range   Color, UA yellow     Clarity, UA cloudy     Glucose, UA neg     Bilirubin, UA neg     Ketones, UA neg     Spec Grav, UA >=1.030     Blood, UA neg     pH, UA 6.0     Protein, UA neg     Urobilinogen, UA 0.2     Nitrite, UA neg     Leukocytes, UA moderate (2+)    POCT WET PREP WITH KOH      Component Value Range   Trichomonas, UA Negative  Clue Cells Wet Prep HPF POC 50%     Epithelial Wet Prep HPF POC 0-7     Yeast Wet Prep HPF POC neg     Bacteria Wet Prep HPF POC 3+     RBC Wet Prep HPF POC 0-1     WBC Wet Prep HPF POC 2-5     KOH Prep POC Positive    POCT RAPID STREP A (OFFICE)      Component Value Range   Rapid Strep A Screen Negative  Negative     Assessment and Plan: 1. Vaginal Discharge  POCT Wet Prep with KOH, fluconazole (DIFLUCAN) 150 MG tablet, metroNIDAZOLE (FLAGYL) 500 MG tablet  2. Sore throat  POCT rapid strep A  3. Dysuria  POCT UA - Microscopic Only, POCT urinalysis dipstick  4. UTI (lower urinary tract infection)  POCT urine pregnancy, Urine culture, nitrofurantoin, macrocrystal-monohydrate, (MACROBID) 100 MG capsule   Treat for UTI with macrobid while ucx pending.  Although she does have some clue cells I suspect that BV is not the cause of her symptoms.  Treat for yeast with difulucan Flagyl rx to hold- can use if symptoms persist after steps listed  above.  Will plan further follow- up pending labs.  Call me sooner if worse

## 2011-11-18 LAB — URINE CULTURE

## 2012-01-11 ENCOUNTER — Ambulatory Visit (INDEPENDENT_AMBULATORY_CARE_PROVIDER_SITE_OTHER): Payer: BC Managed Care – PPO | Admitting: Family Medicine

## 2012-01-11 VITALS — BP 132/90 | HR 94 | Temp 98.3°F | Resp 18 | Ht 63.0 in | Wt 273.0 lb

## 2012-01-11 DIAGNOSIS — IMO0001 Reserved for inherently not codable concepts without codable children: Secondary | ICD-10-CM

## 2012-01-11 DIAGNOSIS — L293 Anogenital pruritus, unspecified: Secondary | ICD-10-CM

## 2012-01-11 DIAGNOSIS — E119 Type 2 diabetes mellitus without complications: Secondary | ICD-10-CM

## 2012-01-11 DIAGNOSIS — N912 Amenorrhea, unspecified: Secondary | ICD-10-CM

## 2012-01-11 DIAGNOSIS — R350 Frequency of micturition: Secondary | ICD-10-CM

## 2012-01-11 DIAGNOSIS — N898 Other specified noninflammatory disorders of vagina: Secondary | ICD-10-CM

## 2012-01-11 DIAGNOSIS — Z87440 Personal history of urinary (tract) infections: Secondary | ICD-10-CM

## 2012-01-11 LAB — POCT URINALYSIS DIPSTICK
Bilirubin, UA: NEGATIVE
Glucose, UA: 500
Nitrite, UA: NEGATIVE
Spec Grav, UA: >=1.03
Urobilinogen, UA: 0.2

## 2012-01-11 LAB — POCT WET PREP WITH KOH
Clue Cells Wet Prep HPF POC: NEGATIVE
Trichomonas, UA: NEGATIVE
Yeast Wet Prep HPF POC: NEGATIVE

## 2012-01-11 LAB — POCT UA - MICROSCOPIC ONLY: Bacteria, U Microscopic: NEGATIVE

## 2012-01-11 MED ORDER — FLUCONAZOLE 150 MG PO TABS: 150.0000 mg | ORAL_TABLET | Freq: Once | ORAL | Status: AC

## 2012-01-11 NOTE — Progress Notes (Signed)
41 year old [place] female is here today with frequent urination and pressure for 4 days and vaginal itching x 2 dys. Pt states she has a history of UTIs. Pt states she may have a little discharge with no odor. Pt states the vaginal discharge is clear. Pt states she has no other complaints. She started initially having the itching. He does have a history of urinary tract infections. She is married so he is sexually involved. She is a diabetic, and takes metformin 500 twice a day. Her sugars are usually good control. She wonders whether stress could be the cause of these instructions.  Objective: Pleasant obese Afro-American female in no acute distress at this time. No CVA tenderness. Abdomen soft without mass or tenderness. Pelvic only minimal discharge, almost entirely normal. Cervix and vagina looked normal. Bimanual exam was without lesions. Does not feel pregnant, though her last period was 2 months ago.  Results for orders placed in visit on 01/11/12  POCT URINALYSIS DIPSTICK      Component Value Range   Color, UA yellow     Clarity, UA cloudy     Glucose, UA 500     Bilirubin, UA negative     Ketones, UA trace     Spec Grav, UA >=1.030     Blood, UA small     pH, UA 6.0     Protein, UA trace     Urobilinogen, UA 0.2     Nitrite, UA negative     Leukocytes, UA Negative    POCT UA - MICROSCOPIC ONLY      Component Value Range   WBC, Ur, HPF, POC 0-1     RBC, urine, microscopic 1-2     Bacteria, U Microscopic negative     Mucus, UA trace     Epithelial cells, urine per micros 2-6     Crystals, Ur, HPF, POC negative     Casts, Ur, LPF, POC negative     Yeast, UA negative    POCT WET PREP WITH KOH      Component Value Range   Trichomonas, UA Negative     Clue Cells Wet Prep HPF POC neg     Epithelial Wet Prep HPF POC 3-6     Yeast Wet Prep HPF POC neg     Bacteria Wet Prep HPF POC trace     RBC Wet Prep HPF POC 0-1     WBC Wet Prep HPF POC 0-2     KOH Prep POC Negative    POCT  URINE PREGNANCY      Component Value Range   Preg Test, Ur Negative     Glucose was 228.  Spoke to the patient that I believe her symptoms are primarily due to the poorly controlled diabetes at this time.

## 2012-01-11 NOTE — Patient Instructions (Addendum)
Drink extra water whenever you have intercourse. Drink plenty of water. Keep a close eye on her sugars.  If you develop more itching or discharge ahead and take one a day Diflucan.  If you keep having urinary tract infections we will consider some other treatment options to try and prevent him.  Strictly pay attention to your diabetes. If symptoms get worse your sugar may need to be treated more strictly with other medicines.

## 2012-01-16 ENCOUNTER — Telehealth: Payer: Self-pay

## 2012-01-16 NOTE — Telephone Encounter (Signed)
Her urine was clear last week so we would need to she patient again.

## 2012-01-16 NOTE — Telephone Encounter (Signed)
Pt in office last week and still is experiencing same symptoms which she believes is a uti she would like to see if Dr can call in rx to pharmacy, [facility] [phone]

## 2012-01-17 NOTE — Telephone Encounter (Signed)
LMOM to advise pt to RTC

## 2012-02-24 ENCOUNTER — Ambulatory Visit: Payer: BC Managed Care – PPO

## 2012-02-24 ENCOUNTER — Ambulatory Visit (INDEPENDENT_AMBULATORY_CARE_PROVIDER_SITE_OTHER): Payer: BC Managed Care – PPO | Admitting: Internal Medicine

## 2012-02-24 VITALS — BP 124/82 | HR 100 | Temp 97.9°F | Resp 16 | Ht 62.5 in | Wt 279.0 lb

## 2012-02-24 DIAGNOSIS — E119 Type 2 diabetes mellitus without complications: Secondary | ICD-10-CM

## 2012-02-24 DIAGNOSIS — N760 Acute vaginitis: Secondary | ICD-10-CM

## 2012-02-24 DIAGNOSIS — Z0000 Encounter for general adult medical examination without abnormal findings: Secondary | ICD-10-CM

## 2012-02-24 DIAGNOSIS — N63 Unspecified lump in unspecified breast: Secondary | ICD-10-CM

## 2012-02-24 LAB — POCT WET PREP WITH KOH
Trichomonas, UA: NEGATIVE
Yeast Wet Prep HPF POC: NEGATIVE

## 2012-02-24 LAB — POCT URINALYSIS DIPSTICK
Blood, UA: NEGATIVE
Glucose, UA: 500
Spec Grav, UA: >=1.03
Urobilinogen, UA: 0.2

## 2012-02-24 LAB — GLUCOSE, POCT (MANUAL RESULT ENTRY): POC Glucose: 249 mg/dl — AB (ref 70–99)

## 2012-02-24 LAB — POCT CBC
HCT, POC: 42 % (ref 37.7–47.9)
Lymph, poc: 3.9 — AB (ref 0.6–3.4)
MCHC: 29.5 g/dL — AB (ref 31.8–35.4)
MID (cbc): 0.7 (ref 0–0.9)
POC Granulocyte: 3.4 (ref 2–6.9)
POC LYMPH PERCENT: 48.6 %L (ref 10–50)
POC MID %: 8.4 %M (ref 0–12)
Platelet Count, POC: 268 10*3/uL (ref 142–424)
RDW, POC: 21.4 %

## 2012-02-24 LAB — LIPID PANEL
Cholesterol: 221 mg/dL — ABNORMAL HIGH (ref 0–200)
HDL: 44 mg/dL (ref >39)
Total CHOL/HDL Ratio: 5 Ratio

## 2012-02-24 LAB — COMPREHENSIVE METABOLIC PANEL
BUN: 10 mg/dL (ref 6–23)
CO2: 26 mEq/L (ref 19–32)
Creat: 0.63 mg/dL (ref 0.50–1.10)
Glucose, Bld: 241 mg/dL — ABNORMAL HIGH (ref 70–99)
Total Bilirubin: 0.3 mg/dL (ref 0.3–1.2)

## 2012-02-24 LAB — TSH: TSH: 0.919 u[IU]/mL (ref 0.350–4.500)

## 2012-02-24 MED ORDER — METRONIDAZOLE 500 MG PO TABS: 500.0000 mg | ORAL_TABLET | Freq: Two times a day (BID) | ORAL | Status: AC

## 2012-02-24 MED ORDER — METFORMIN HCL 1000 MG PO TABS: 1000.0000 mg | ORAL_TABLET | Freq: Two times a day (BID) | ORAL | Status: DC

## 2012-02-24 NOTE — Progress Notes (Signed)
Subjective:    Patient ID: [name], female    DOB: [birth year], 41 y.o.   MRN: [id number]  HPI 1st visit for this obese diabetic 41 yo black female. Has fhx breast cancer. See scanned hx   Review of Systems See scanned ros    Objective:   Physical Exam  Constitutional: She is oriented to person, place, and time. She appears well-nourished. No distress.  HENT:  Right Ear: External ear normal.  Left Ear: External ear normal.  Nose: Nose normal.  Mouth/Throat: Oropharynx is clear and moist.  Eyes: EOM are normal. Pupils are equal, round, and reactive to light.  Neck: Normal range of motion. Neck supple. No thyromegaly present.  Cardiovascular: Regular rhythm and normal heart sounds.  Tachycardia present.   Pulmonary/Chest: Effort normal and breath sounds normal.  Abdominal: Soft. Bowel sounds are normal. There is no tenderness.  Genitourinary: There is breast swelling. No breast tenderness, discharge or bleeding. Pelvic exam was performed with patient supine. Vaginal discharge found.  Musculoskeletal: Normal range of motion. She exhibits tenderness.  Lymphadenopathy:    She has no cervical adenopathy.  Neurological: She is alert and oriented to person, place, and time. She has normal reflexes. She exhibits normal muscle tone. Coordination normal.  Skin: Skin is warm and dry.  Psychiatric: She has a normal mood and affect. Her behavior is normal. Judgment and thought content normal.   ekg [facility] reading (PRIMARY) by  Dr.Tor Tsuda. cxr nad Results for orders placed in visit on 02/24/12  POCT CBC      Component Value Range   WBC 8.0  4.6 - 10.2 K/uL   Lymph, poc 3.9 (*) 0.6 - 3.4   POC LYMPH PERCENT 48.6  10 - 50 %L   MID (cbc) 0.7  0 - 0.9   POC MID % 8.4  0 - 12 %M   POC Granulocyte 3.4  2 - 6.9   Granulocyte percent 43.0  37 - 80 %G   RBC 5.78 (*) 4.04 - 5.48 M/uL   Hemoglobin 12.4  12.2 - 16.2 g/dL   HCT, POC 82.9  56.2 - 47.9 %   MCV 72.6 (*) 80 - 97 fL   MCH, POC  21.5 (*) 27 - 31.2 pg   MCHC 29.5 (*) 31.8 - 35.4 g/dL   RDW, POC 13.0     Platelet Count, POC 268  142 - 424 K/uL   MPV    0 - 99.8 fL  GLUCOSE, POCT (MANUAL RESULT ENTRY)      Component Value Range   POC Glucose 249 (*) 70 - 99 mg/dl  POCT GLYCOSYLATED HEMOGLOBIN (HGB A1C)      Component Value Range   Hemoglobin A1C 10.1    POCT URINALYSIS DIPSTICK      Component Value Range   Color, UA YELLOW     Clarity, UA CLEAR     Glucose, UA 500     Bilirubin, UA NEG     Ketones, UA 15     Spec Grav, UA >=1.030     Blood, UA NEG     pH, UA 6.0     Protein, UA 30     Urobilinogen, UA 0.2     Nitrite, UA NEG     Leukocytes, UA Negative    POCT URINE PREGNANCY      Component Value Range   Preg Test, Ur Negative    POCT WET PREP WITH KOH      Component Value Range  Trichomonas, UA Negative     Clue Cells Wet Prep HPF POC TNTC     Epithelial Wet Prep HPF POC       Yeast Wet Prep HPF POC NEG     Bacteria Wet Prep HPF POC 2+     RBC Wet Prep HPF POC 5-9     WBC Wet Prep HPF POC 3-7     KOH Prep POC Negative             Assessment & Plan:  Breast masses right greater than left refer for diagnostic mammogram Morbid obesity Niddm/uncontrolled Vaginitis/BV

## 2012-02-28 ENCOUNTER — Other Ambulatory Visit: Payer: Self-pay | Admitting: Internal Medicine

## 2012-02-28 DIAGNOSIS — N63 Unspecified lump in unspecified breast: Secondary | ICD-10-CM

## 2012-02-28 DIAGNOSIS — Z0000 Encounter for general adult medical examination without abnormal findings: Secondary | ICD-10-CM

## 2012-02-28 DIAGNOSIS — N760 Acute vaginitis: Secondary | ICD-10-CM

## 2012-02-28 DIAGNOSIS — E119 Type 2 diabetes mellitus without complications: Secondary | ICD-10-CM

## 2012-03-01 ENCOUNTER — Ambulatory Visit (INDEPENDENT_AMBULATORY_CARE_PROVIDER_SITE_OTHER): Payer: BC Managed Care – PPO | Admitting: Internal Medicine

## 2012-03-01 VITALS — BP 117/81 | HR 103 | Temp 98.0°F | Resp 18 | Ht 63.0 in | Wt 272.6 lb

## 2012-03-01 DIAGNOSIS — E1165 Type 2 diabetes mellitus with hyperglycemia: Secondary | ICD-10-CM

## 2012-03-01 DIAGNOSIS — N390 Urinary tract infection, site not specified: Secondary | ICD-10-CM

## 2012-03-01 DIAGNOSIS — E669 Obesity, unspecified: Secondary | ICD-10-CM | POA: Insufficient documentation

## 2012-03-01 DIAGNOSIS — R300 Dysuria: Secondary | ICD-10-CM

## 2012-03-01 LAB — POCT UA - MICROSCOPIC ONLY
Mucus, UA: NEGATIVE
Yeast, UA: NEGATIVE

## 2012-03-01 LAB — POCT URINALYSIS DIPSTICK
Bilirubin, UA: NEGATIVE
Glucose, UA: NEGATIVE
Spec Grav, UA: 1.025
pH, UA: 5.5

## 2012-03-01 MED ORDER — CIPROFLOXACIN HCL 500 MG PO TABS: 500.0000 mg | ORAL_TABLET | Freq: Two times a day (BID) | ORAL | Status: AC

## 2012-03-01 NOTE — Patient Instructions (Signed)

## 2012-03-01 NOTE — Progress Notes (Signed)
  Subjective:    Patient ID: [name], female    DOB: 1971-03-20, 41 y.o.   MRN: [id number]  HPI NIDDM uncontrolled here for glucose ck on her meds. Also c/o burning on urination, urgency, freq No fever  Review of Systems     Objective:   Physical Exam Normal exam  Results for orders placed in visit on 03/01/12  GLUCOSE, POCT (MANUAL RESULT ENTRY)      Component Value Range   POC Glucose 154 (*) 70 - 99 mg/dl  POCT UA - MICROSCOPIC ONLY      Component Value Range   WBC, Ur, HPF, POC 5-8     RBC, urine, microscopic 0-2     Bacteria, U Microscopic small     Mucus, UA neg     Epithelial cells, urine per micros 0-3     Crystals, Ur, HPF, POC neg     Casts, Ur, LPF, POC neg     Yeast, UA neg    POCT URINALYSIS DIPSTICK      Component Value Range   Color, UA yellow     Clarity, UA clear     Glucose, UA neg     Bilirubin, UA neg     Ketones, UA trace     Spec Grav, UA 1.025     Blood, UA trace     pH, UA 5.5     Protein, UA neg     Urobilinogen, UA 0.2     Nitrite, UA neg     Leukocytes, UA small (1+)      labs    Assessment & Plan:  NIDDM UTI/cipro UTI

## 2012-03-02 ENCOUNTER — Ambulatory Visit
Admission: RE | Admit: 2012-03-02 | Discharge: 2012-03-02 | Disposition: A | Discharge status: Home / Self Care | Payer: BC Managed Care – PPO | Source: Ambulatory Visit | Attending: Internal Medicine | Admitting: Internal Medicine

## 2012-03-02 DIAGNOSIS — E119 Type 2 diabetes mellitus without complications: Secondary | ICD-10-CM

## 2012-03-02 DIAGNOSIS — Z0000 Encounter for general adult medical examination without abnormal findings: Secondary | ICD-10-CM

## 2012-03-02 DIAGNOSIS — N760 Acute vaginitis: Secondary | ICD-10-CM

## 2012-03-02 DIAGNOSIS — N63 Unspecified lump in unspecified breast: Secondary | ICD-10-CM

## 2012-03-03 ENCOUNTER — Other Ambulatory Visit: Payer: Self-pay | Admitting: Family Medicine

## 2012-03-03 LAB — URINE CULTURE

## 2012-03-13 ENCOUNTER — Ambulatory Visit: Payer: 59 | Admitting: *Deleted

## 2012-09-06 ENCOUNTER — Ambulatory Visit (INDEPENDENT_AMBULATORY_CARE_PROVIDER_SITE_OTHER): Payer: BC Managed Care – PPO | Admitting: Family Medicine

## 2012-09-06 VITALS — BP 130/80 | HR 100 | Temp 98.0°F | Resp 16 | Ht 62.5 in | Wt 265.8 lb

## 2012-09-06 DIAGNOSIS — J111 Influenza due to unidentified influenza virus with other respiratory manifestations: Secondary | ICD-10-CM

## 2012-09-06 DIAGNOSIS — B349 Viral infection, unspecified: Secondary | ICD-10-CM

## 2012-09-06 DIAGNOSIS — B9789 Other viral agents as the cause of diseases classified elsewhere: Secondary | ICD-10-CM

## 2012-09-06 DIAGNOSIS — E119 Type 2 diabetes mellitus without complications: Secondary | ICD-10-CM

## 2012-09-06 LAB — POCT CBC
Hemoglobin: 13.9 g/dL (ref 12.2–16.2)
Lymph, poc: 3.1 (ref 0.6–3.4)
MCHC: 31 g/dL — AB (ref 31.8–35.4)
MPV: 12.1 fL (ref 0–99.8)
POC Granulocyte: 4.3 (ref 2–6.9)
POC MID %: 8.4 %M (ref 0–12)
RDW, POC: 17.6 %

## 2012-09-06 LAB — POCT GLYCOSYLATED HEMOGLOBIN (HGB A1C): Hemoglobin A1C: 7.1

## 2012-09-06 LAB — POCT INFLUENZA A/B: Influenza A, POC: NEGATIVE

## 2012-09-06 MED ORDER — IPRATROPIUM BROMIDE 0.03 % NA SOLN: 2.0000 | Freq: Four times a day (QID) | NASAL | Status: DC

## 2012-09-06 MED ORDER — FLUTICASONE PROPIONATE 50 MCG/ACT NA SUSP: 2.0000 | Freq: Every day | NASAL | Status: DC

## 2012-09-06 MED ORDER — PROMETHAZINE HCL 25 MG PO TABS: 25.0000 mg | ORAL_TABLET | Freq: Three times a day (TID) | ORAL | Status: DC | PRN

## 2012-09-06 NOTE — Progress Notes (Signed)
Subjective:    Patient ID: [name], female    DOB: March 17, 1971, 42 y.o.   MRN: [id number] Chief Complaint  Patient presents with  . Headache    body aches fever diarrhea nausea x 1 day    HPI  Started yesterday - hit her out of the blue. Lots of sick contacts - one person with the flu and lots of people with stomach viruses at work.  Felt really feverish last night, body aches, chills, sweats.  Ears painful and ringing, lots of sinus pressure and pND and sore throat.  No cough.  Has had a few loose stools and some nausea.  Is able to drink but no appetite.    Is dm so not using any otc meds.  Flu shot in Oct. Is not allowed to stay out of work unless she has the flu.  Has not checked her sugars since getting ill - they were kind of high recently - up to 160.  Past Medical History  Diagnosis Date  . Diabetes mellitus    Current Outpatient Prescriptions on File Prior to Visit  Medication Sig Dispense Refill  . metFORMIN (GLUCOPHAGE) 1000 MG tablet Take 1 tablet (1,000 mg total) by mouth 2 (two) times daily with a meal.  180 tablet  3  . naproxen (NAPROSYN) 500 MG tablet Take 500 mg by mouth 2 (two) times daily with a meal.         No current facility-administered medications on file prior to visit.   Allergies  Allergen Reactions  . Percocet (Oxycodone-Acetaminophen) Itching     Review of Systems  Constitutional: Positive for fever, chills, diaphoresis, activity change, appetite change and fatigue.  HENT: Positive for ear pain, congestion, sore throat, rhinorrhea, sneezing, neck pain, neck stiffness, postnasal drip, sinus pressure and tinnitus. Negative for hearing loss, nosebleeds, mouth sores, trouble swallowing, voice change and ear discharge.   Eyes: Positive for itching. Negative for photophobia and pain.  Respiratory: Negative for cough and shortness of breath.   Cardiovascular: Negative for chest pain.  Gastrointestinal: Positive for nausea and diarrhea.  Negative for vomiting, abdominal pain and constipation.  Genitourinary: Positive for frequency and decreased urine volume. Negative for dysuria.  Musculoskeletal: Positive for myalgias and arthralgias. Negative for joint swelling and gait problem.  Neurological: Positive for weakness, light-headedness and headaches. Negative for dizziness and syncope.  Hematological: Negative for adenopathy.  Psychiatric/Behavioral: Positive for sleep disturbance.      BP 130/80  Pulse 100  Temp(Src) 98 F (36.7 C) (Oral)  Resp 16  Ht 5' 2.5" (1.588 m)  Wt 265 lb 12.8 oz (120.566 kg)  BMI 47.81 kg/m2  SpO2 98% Objective:   Physical Exam  Constitutional: She is oriented to person, place, and time. She appears well-developed and well-nourished. She appears lethargic. She appears ill. No distress.  HENT:  Head: Normocephalic and atraumatic. No trismus in the jaw.  Right Ear: External ear and ear canal normal. Tympanic membrane is retracted. A middle ear effusion is present.  Left Ear: External ear and ear canal normal. Tympanic membrane is retracted. A middle ear effusion is present.  Nose: Mucosal edema and rhinorrhea present.  Mouth/Throat: Uvula is midline and mucous membranes are normal. No edematous. Posterior oropharyngeal erythema present. No oropharyngeal exudate, posterior oropharyngeal edema or tonsillar abscesses.  Eyes: Conjunctivae are normal. Right eye exhibits no discharge. Left eye exhibits no discharge. No scleral icterus.  Neck: Normal range of motion. Neck supple.  Cardiovascular: Normal rate, regular rhythm, normal  heart sounds and intact distal pulses.   Pulmonary/Chest: Effort normal and breath sounds normal.  Abdominal: Soft. Bowel sounds are normal. She exhibits no distension and no mass. There is generalized tenderness. There is no rebound and no guarding.  Lymphadenopathy:       Head (right side): Submandibular adenopathy present. No tonsillar, no preauricular, no posterior  auricular and no occipital adenopathy present.       Head (left side): Submandibular adenopathy present. No tonsillar, no preauricular, no posterior auricular and no occipital adenopathy present.    She has cervical adenopathy.       Right cervical: Superficial cervical adenopathy present.       Left cervical: Superficial cervical adenopathy present.       Right: No supraclavicular adenopathy present.       Left: No supraclavicular adenopathy present.  Neurological: She is oriented to person, place, and time. She appears lethargic.  Skin: Skin is warm and dry. She is not diaphoretic. No erythema.  Psychiatric: She has a normal mood and affect. Her behavior is normal.      Results for orders placed in visit on 09/06/12  POCT CBC      Result Value Range   WBC 8.1  4.6 - 10.2 K/uL   Lymph, poc 3.1  0.6 - 3.4   POC LYMPH PERCENT 38.2  10 - 50 %L   MID (cbc) 0.7  0 - 0.9   POC MID % 8.4  0 - 12 %M   POC Granulocyte 4.3  2 - 6.9   Granulocyte percent 53.4  37 - 80 %G   RBC 5.21  4.04 - 5.48 M/uL   Hemoglobin 13.9  12.2 - 16.2 g/dL   HCT, POC 95.2  84.1 - 47.9 %   MCV 86.2  80 - 97 fL   MCH, POC 26.7 (*) 27 - 31.2 pg   MCHC 31.0 (*) 31.8 - 35.4 g/dL   RDW, POC 32.4     Platelet Count, POC 265  142 - 424 K/uL   MPV 12.1  0 - 99.8 fL  POCT INFLUENZA A/B      Result Value Range   Influenza A, POC Negative     Influenza B, POC Negative      Assessment & Plan:  Viral syndrome - Plan: POCT CBC, POCT Influenza A/B  Diabetes mellitus - Plan: POCT glucose (manual entry), POCT glycosylated hemoglobin (Hb A1C) - watch sugars closely and push fluids. Do not stop taking metformin even if not eating much.  Influenza-like illness - Plan: fluticasone (FLONASE) 50 MCG/ACT nasal spray.  Rec symptomatic care. I suspect she is highly contagious so gave work note requesting that she be out for the rest of the wk (3d).  Meds ordered this encounter  Medications  . promethazine (PHENERGAN) 25 MG  tablet    Sig: Take 1 tablet (25 mg total) by mouth every 8 (eight) hours as needed for nausea.    Dispense:  20 tablet    Refill:  0  . ipratropium (ATROVENT) 0.03 % nasal spray    Sig: Place 2 sprays into the nose 4 (four) times daily.    Dispense:  30 mL    Refill:  1  . fluticasone (FLONASE) 50 MCG/ACT nasal spray    Sig: Place 2 sprays into the nose at bedtime.    Dispense:  16 g    Refill:  1   Hot showers or breathing in steam and using a humidifier may help  loosen the congestion. Using a netti pot or sinus rinse is also likely to help you feel better and keep this from progressing. Use the atrovent nasal spray as needed throughout the day and use the fluticasone nasal spray every night before bed for at least 2 weeks. I recommend augmenting with 12 hr sudafed (behind the counter) and generic mucinex to help you move out the congestion. If no improvement or you are getting worse, come back as you might need a course of steroids and/or antibiotics but hopefully with all of the above, you can avoid it.

## 2012-09-06 NOTE — Patient Instructions (Signed)
Hot showers or breathing in steam and using a humidifier may help loosen the congestion.  Using a netti pot or sinus rinse is also likely to help you feel better and keep this from progressing.  Use the atrovent nasal spray as needed throughout the day and use the fluticasone nasal spray every night before bed for at least 2 weeks.  I recommend augmenting with 12 hr sudafed (behind the counter) and generic mucinex to help you move out the congestion.  If no improvement or you are getting worse, come back as you might need a course of steroids and/or antibiotics but hopefully with all of the above, you can avoid it.   Viral Syndrome You or your child has Viral Syndrome. It is the most common infection causing "colds" and infections in the nose, throat, sinuses, and breathing tubes. Sometimes the infection causes nausea, vomiting, or diarrhea. The germ that causes the infection is a virus. No antibiotic or other medicine will kill it. There are medicines that you can take to make you or your child more comfortable.  HOME CARE INSTRUCTIONS   Rest in bed until you start to feel better.  If you have diarrhea or vomiting, eat small amounts of crackers and toast. Soup is helpful.  Do not give aspirin or medicine that contains aspirin to children.  Only take over-the-counter or prescription medicines for pain, discomfort, or fever as directed by your caregiver. SEEK IMMEDIATE MEDICAL CARE IF:   You or your child has not improved within one week.  You or your child has pain that is not at least partially relieved by over-the-counter medicine.  Thick, colored mucus or blood is coughed up.  Discharge from the nose becomes thick yellow or green.  Diarrhea or vomiting gets worse.  There is any major change in your or your child's condition.  You or your child develops a skin rash, stiff neck, severe headache, or are unable to hold down food or fluid.  You or your child has an oral temperature above  102 F (38.9 C), not controlled by medicine.  Your baby is older than 3 months with a rectal temperature of 102 F (38.9 C) or higher.  Your baby is 19 months old or younger with a rectal temperature of 100.4 F (38 C) or higher. Document Released: 05/29/2006 Document Revised: 09/05/2011 Document Reviewed: 05/30/2007 [facility] Patient Information 2013 [place], [place].

## 2012-09-21 ENCOUNTER — Ambulatory Visit (INDEPENDENT_AMBULATORY_CARE_PROVIDER_SITE_OTHER): Payer: BC Managed Care – PPO | Admitting: Family Medicine

## 2012-09-21 VITALS — BP 130/86 | HR 100 | Temp 98.3°F | Resp 18 | Ht 62.5 in | Wt 267.0 lb

## 2012-09-21 DIAGNOSIS — N390 Urinary tract infection, site not specified: Secondary | ICD-10-CM

## 2012-09-21 DIAGNOSIS — E119 Type 2 diabetes mellitus without complications: Secondary | ICD-10-CM

## 2012-09-21 DIAGNOSIS — N926 Irregular menstruation, unspecified: Secondary | ICD-10-CM

## 2012-09-21 DIAGNOSIS — N898 Other specified noninflammatory disorders of vagina: Secondary | ICD-10-CM

## 2012-09-21 LAB — POCT URINALYSIS DIPSTICK
Spec Grav, UA: >=1.03
pH, UA: 5.5

## 2012-09-21 LAB — POCT WET PREP WITH KOH
Clue Cells Wet Prep HPF POC: NEGATIVE
RBC Wet Prep HPF POC: NEGATIVE

## 2012-09-21 LAB — POCT UA - MICROSCOPIC ONLY
Casts, Ur, LPF, POC: NEGATIVE
Crystals, Ur, HPF, POC: NEGATIVE
Mucus, UA: NEGATIVE

## 2012-09-21 MED ORDER — NITROFURANTOIN MONOHYD MACRO 100 MG PO CAPS: 100.0000 mg | ORAL_CAPSULE | Freq: Two times a day (BID) | ORAL | Status: DC

## 2012-09-21 NOTE — Patient Instructions (Addendum)
I will be in touch regarding your urine culture results.  Drink plenty of water.  We need to work on your A1c a bit- our goal is less than 7%. Work on [employer] and exercise routines to help bring your sugar under better control.

## 2012-09-21 NOTE — Progress Notes (Addendum)
[facility] [address] [place] [address] [phone]  Date:  09/21/2012   Name:  [name]   DOB:  1970-07-06   MRN:  [id number]  PCP:  Norberto Sorenson, MD    Chief Complaint: Diabetes and Urinary Frequency   History of Present Illness:  [name] is a 42 y.o. very pleasant female patient who presents with the following:  She is here today to have her A1c checked.  Today she is concerned about a UTI- she notes urinary freqency, dysuria.  She has some clear vaginal discharge, no vaginal itching.  She is here with her husband today and denies any other partners or STI risk.   No fever or chills, some nausea LMP was in February the last week of the month.  She notes her cycles are irregular and wonders if she is peri- menopausal   Patient Active Problem List  Diagnosis  . Diabetes mellitus  . Obesity    Past Medical History  Diagnosis Date  . Diabetes mellitus     Past Surgical History  Procedure Laterality Date  . Knee surgery      History  Substance Use Topics  . Smoking status: Never Smoker   . Smokeless tobacco: Not on file  . Alcohol Use: No    Family History  Problem Relation Age of Onset  . Hypertension Mother   . Diabetes Father   . Hypertension Father   . Hypertension Maternal Grandmother   . Hypertension Maternal Grandfather   . Hypertension Paternal Grandmother   . Hypertension Paternal Grandfather     Allergies  Allergen Reactions  . Percocet (Oxycodone-Acetaminophen) Itching    Medication list has been reviewed and updated.  Current Outpatient Prescriptions on File Prior to Visit  Medication Sig Dispense Refill  . metFORMIN (GLUCOPHAGE) 1000 MG tablet Take 1 tablet (1,000 mg total) by mouth 2 (two) times daily with a meal.  180 tablet  3  . fluticasone (FLONASE) 50 MCG/ACT nasal spray Place 2 sprays into the nose at bedtime.  16 g  1  . ipratropium (ATROVENT) 0.03 % nasal spray Place 2 sprays into the  nose 4 (four) times daily.  30 mL  1  . naproxen (NAPROSYN) 500 MG tablet Take 500 mg by mouth 2 (two) times daily with a meal.        . promethazine (PHENERGAN) 25 MG tablet Take 1 tablet (25 mg total) by mouth every 8 (eight) hours as needed for nausea.  20 tablet  0   No current facility-administered medications on file prior to visit.    Review of Systems:  As per HPI- otherwise negative.   Physical Examination: Filed Vitals:   09/21/12 1725  BP: 152/100  Pulse: 113  Temp: 98.3 F (36.8 C)  Resp: 18   Filed Vitals:   09/21/12 1725  Height: 5' 2.5" (1.588 m)  Weight: 267 lb (121.11 kg)   Body mass index is 48.03 kg/(m^2). Ideal Body Weight: Weight in (lb) to have BMI = 25: 138.6  GEN: WDWN, NAD, Non-toxic, A & O x 3, obese HEENT: Atraumatic, Normocephalic. Neck supple. No masses, No LAD. Ears and Nose: No external deformity. CV: RRR, No M/G/R. No JVD. No thrill. No extra heart sounds. PULM: CTA B, no wheezes, crackles, rhonchi. No retractions. No resp. distress. No accessory muscle use. ABD: S, NT, ND. No rebound. No HSM. EXTR: No c/c/e NEURO Normal gait.  PSYCH: Normally interactive. Conversant. Not depressed or  anxious appearing.  Calm demeanor.  Declined a vaginal exam today but did do a self- collect wet prep.    Results for orders placed in visit on 09/21/12  POCT URINALYSIS DIPSTICK      Result Value Range   Color, UA ORANGE     Clarity, UA CLOUDY     Glucose, UA NEG     Bilirubin, UA SMALL     Ketones, UA TRACE     Spec Grav, UA >=1.030     Blood, UA TRACE-INTACT     pH, UA 5.5     Protein, UA 30     Urobilinogen, UA 0.2     Nitrite, UA NEG     Leukocytes, UA Negative    POCT UA - MICROSCOPIC ONLY      Result Value Range   WBC, Ur, HPF, POC 0-1     RBC, urine, microscopic 1-3     Bacteria, U Microscopic TRACE     Mucus, UA NEG     Epithelial cells, urine per micros 3-5     Crystals, Ur, HPF, POC NEG     Casts, Ur, LPF, POC NEG     Yeast, UA  NEG    POCT GLYCOSYLATED HEMOGLOBIN (HGB A1C)      Result Value Range   Hemoglobin A1C 7.6    POCT URINE PREGNANCY      Result Value Range   Preg Test, Ur Negative    POCT WET PREP WITH KOH      Result Value Range   Trichomonas, UA Negative     Clue Cells Wet Prep HPF POC NEG     Epithelial Wet Prep HPF POC 4-5     Yeast Wet Prep HPF POC NEG     Bacteria Wet Prep HPF POC TRACE     RBC Wet Prep HPF POC NEG     WBC Wet Prep HPF POC 0-1     KOH Prep POC Negative       Assessment and Plan: UTI (urinary tract infection) - Plan: POCT urinalysis dipstick, POCT UA - Microscopic Only, Urine culture, nitrofurantoin, macrocrystal-monohydrate, (MACROBID) 100 MG capsule  Type II or unspecified type diabetes mellitus without mention of complication, not stated as uncontrolled - Plan: POCT glycosylated hemoglobin (Hb A1C)  Menstrual irregularity - Plan: POCT urine pregnancy  Vaginal discharge - Plan: POCT Wet Prep with KOH  Possible UTI by symptoms- will treat with macrobid po.   Await urine culture.  Encouraged her to work on her diabetes control.  Suspect her lack of menses may be due to anovulation.   She is to let me know if her urinary symptoms are not better in the next 2 or 3 days  Signed Abbe Amsterdam, MD  09/25/12- called her to discuss labs.  No answer so LMOM- her urine culture was inconclusive.  Please let us know if not better- will send letter as well

## 2012-09-23 LAB — URINE CULTURE: Colony Count: 45000

## 2012-09-25 ENCOUNTER — Encounter: Payer: Self-pay | Admitting: Family Medicine

## 2013-03-15 ENCOUNTER — Other Ambulatory Visit: Payer: Self-pay | Admitting: Internal Medicine

## 2013-04-25 ENCOUNTER — Other Ambulatory Visit: Payer: Self-pay | Admitting: Physician Assistant

## 2013-08-06 ENCOUNTER — Ambulatory Visit (INDEPENDENT_AMBULATORY_CARE_PROVIDER_SITE_OTHER): Payer: BC Managed Care – PPO | Admitting: Family Medicine

## 2013-08-06 ENCOUNTER — Encounter: Payer: Self-pay | Admitting: Family Medicine

## 2013-08-06 VITALS — BP 130/80 | HR 80 | Temp 98.0°F | Resp 16 | Ht 62.75 in | Wt 257.0 lb

## 2013-08-06 DIAGNOSIS — IMO0001 Reserved for inherently not codable concepts without codable children: Secondary | ICD-10-CM

## 2013-08-06 DIAGNOSIS — N390 Urinary tract infection, site not specified: Secondary | ICD-10-CM

## 2013-08-06 DIAGNOSIS — N760 Acute vaginitis: Secondary | ICD-10-CM

## 2013-08-06 DIAGNOSIS — E1165 Type 2 diabetes mellitus with hyperglycemia: Principal | ICD-10-CM

## 2013-08-06 DIAGNOSIS — R300 Dysuria: Secondary | ICD-10-CM

## 2013-08-06 LAB — POCT URINALYSIS DIPSTICK
Bilirubin, UA: NEGATIVE
Glucose, UA: NEGATIVE
Nitrite, UA: NEGATIVE
PH UA: 6
PROTEIN UA: 30
SPEC GRAV UA: 1.025
UROBILINOGEN UA: 0.2

## 2013-08-06 LAB — POCT UA - MICROSCOPIC ONLY
CASTS, UR, LPF, POC: NEGATIVE
CRYSTALS, UR, HPF, POC: NEGATIVE
YEAST UA: NEGATIVE

## 2013-08-06 LAB — GLUCOSE, POCT (MANUAL RESULT ENTRY): POC Glucose: 190 mg/dl — AB (ref 70–99)

## 2013-08-06 LAB — POCT GLYCOSYLATED HEMOGLOBIN (HGB A1C): HEMOGLOBIN A1C: 7.8

## 2013-08-06 MED ORDER — CANAGLIFLOZIN 100 MG PO TABS: 100.0000 mg | ORAL_TABLET | ORAL | Status: DC

## 2013-08-06 MED ORDER — FLUCONAZOLE 150 MG PO TABS: 150.0000 mg | ORAL_TABLET | Freq: Once | ORAL | Status: DC

## 2013-08-06 MED ORDER — NITROFURANTOIN MONOHYD MACRO 100 MG PO CAPS: 100.0000 mg | ORAL_CAPSULE | Freq: Two times a day (BID) | ORAL | Status: DC

## 2013-08-06 NOTE — Patient Instructions (Signed)
Take the new diabetic pill once every morning with breakfast  Continue your metformin  Try to get regular exercise by doing some walking every day  Take the nitrofurantoin one twice daily with food for your urinary tract infection  Take the Diflucan if necessary for yeast infection  Return in 3-4 months, sooner if problems

## 2013-08-06 NOTE — Progress Notes (Signed)
Subjective: 43 year old lady who is here with a history of having some vaginal irritation. She suspects that she may have an elevated blood sugar. She has a little itching and irritation. No dyspareunia. Last menses was in September. She thinks she is just premenopausal. No headaches or dizziness. No chest pains. No respiratory symptoms. No GI symptoms. No major joint complaints. No ankle edema. Irregular menses but no suspicion of pregnancy. Her sugars at home run between 150 to 220  Objective: Pleasant lady, overweight. She has lost 10 pounds since last year. Her throat clear. Neck supple without nodes. Chest clear. Heart regular without murmurs. And soft without masses or tenderness. No ankle edema.  Assessment: Diabetes mellitus Vaginitis or vaginosis Overweight Irregular menses, presumably premenopausal  Plan: Hemoglobin A1c, glucose, C. met, urinalysis  Will need to keep her on the same metformin. After checking labs will decide if she needs other medications. I think we will probably symptomatically treat the mild vaginal symptoms without doing a pelvic today. However if symptoms persist will need a pelvic exam.  Results for orders placed in visit on 08/06/13  GLUCOSE, POCT (MANUAL RESULT ENTRY)      Result Value Range   POC Glucose 190 (*) 70 - 99 mg/dl  POCT GLYCOSYLATED HEMOGLOBIN (HGB A1C)      Result Value Range   Hemoglobin A1C 7.8    POCT URINALYSIS DIPSTICK      Result Value Range   Color, UA yellow     Clarity, UA cloudy     Glucose, UA neg     Bilirubin, UA neg     Ketones, UA trace     Spec Grav, UA 1.025     Blood, UA moderate     pH, UA 6.0     Protein, UA 30     Urobilinogen, UA 0.2     Nitrite, UA neg     Leukocytes, UA large (3+)    POCT UA - MICROSCOPIC ONLY      Result Value Range   WBC, Ur, HPF, POC tntc     RBC, urine, microscopic 0-1     Bacteria, U Microscopic 1+     Mucus, UA moderate     Epithelial cells, urine per micros tntc     Crystals,  Ur, HPF, POC neg     Casts, Ur, LPF, POC neg     Yeast, UA neg

## 2013-08-07 LAB — COMPREHENSIVE METABOLIC PANEL
ALBUMIN: 3.7 g/dL (ref 3.5–5.2)
ALK PHOS: 63 U/L (ref 39–117)
ALT: 58 U/L — AB (ref 0–35)
AST: 56 U/L — ABNORMAL HIGH (ref 0–37)
BILIRUBIN TOTAL: 0.2 mg/dL (ref 0.2–1.2)
BUN: 10 mg/dL (ref 6–23)
CO2: 27 mEq/L (ref 19–32)
Calcium: 9.2 mg/dL (ref 8.4–10.5)
Chloride: 100 mEq/L (ref 96–112)
Creat: 0.68 mg/dL (ref 0.50–1.10)
Glucose, Bld: 181 mg/dL — ABNORMAL HIGH (ref 70–99)
POTASSIUM: 4.6 meq/L (ref 3.5–5.3)
SODIUM: 137 meq/L (ref 135–145)
TOTAL PROTEIN: 7.5 g/dL (ref 6.0–8.3)

## 2013-08-08 ENCOUNTER — Encounter: Payer: Self-pay | Admitting: Family Medicine

## 2013-08-14 ENCOUNTER — Telehealth: Payer: Self-pay

## 2013-08-14 NOTE — Telephone Encounter (Signed)
PT STATES [employer] WILL NO LONGER COVER HER BP MEDICINE AND DIDN'T KNOW IF WE COULD CALL THEM FOR AN APPROVAL OR DO WE GIVE HER ANOTHER MEDICINE PLEASE CALL [phone]

## 2013-08-14 NOTE — Telephone Encounter (Signed)
Pt was placed on INVOCANA 100mg  for control of her diabetes. She is not able to afford the co pay. Do you recommend a different medication?

## 2013-08-22 ENCOUNTER — Other Ambulatory Visit: Payer: Self-pay | Admitting: Family Medicine

## 2013-08-23 NOTE — Telephone Encounter (Signed)
Pt states that she is still having symptoms of a UTI. Best# [id number] Pharmacy: [facility] on [address]

## 2013-10-22 ENCOUNTER — Ambulatory Visit (INDEPENDENT_AMBULATORY_CARE_PROVIDER_SITE_OTHER): Payer: BC Managed Care – PPO | Admitting: Emergency Medicine

## 2013-10-22 VITALS — BP 134/82 | HR 114 | Temp 98.0°F | Ht 62.75 in | Wt 264.0 lb

## 2013-10-22 DIAGNOSIS — E1165 Type 2 diabetes mellitus with hyperglycemia: Principal | ICD-10-CM

## 2013-10-22 DIAGNOSIS — R300 Dysuria: Secondary | ICD-10-CM

## 2013-10-22 DIAGNOSIS — N3000 Acute cystitis without hematuria: Secondary | ICD-10-CM

## 2013-10-22 DIAGNOSIS — IMO0001 Reserved for inherently not codable concepts without codable children: Secondary | ICD-10-CM

## 2013-10-22 LAB — POCT UA - MICROSCOPIC ONLY
CASTS, UR, LPF, POC: NEGATIVE
Crystals, Ur, HPF, POC: NEGATIVE
Mucus, UA: POSITIVE
Yeast, UA: NEGATIVE

## 2013-10-22 LAB — POCT URINALYSIS DIPSTICK
BILIRUBIN UA: NEGATIVE
Glucose, UA: NEGATIVE
Ketones, UA: NEGATIVE
LEUKOCYTES UA: NEGATIVE
NITRITE UA: NEGATIVE
Spec Grav, UA: >=1.03
Urobilinogen, UA: 0.2
pH, UA: 5.5

## 2013-10-22 LAB — POCT CBC
Granulocyte percent: 43 %G (ref 37–80)
HCT, POC: 39.8 % (ref 37.7–47.9)
Hemoglobin: 12.4 g/dL (ref 12.2–16.2)
LYMPH, POC: 4.7 — AB (ref 0.6–3.4)
MCH: 24.2 pg — AB (ref 27–31.2)
MCHC: 31.2 g/dL — AB (ref 31.8–35.4)
MCV: 77.5 fL — AB (ref 80–97)
MID (cbc): 0.5 (ref 0–0.9)
MPV: 11.4 fL (ref 0–99.8)
POC Granulocyte: 3.9 (ref 2–6.9)
POC LYMPH PERCENT: 51.6 %L — AB (ref 10–50)
POC MID %: 5.4 %M (ref 0–12)
Platelet Count, POC: 288 10*3/uL (ref 142–424)
RBC: 5.13 M/uL (ref 4.04–5.48)
RDW, POC: 21.3 %
WBC: 9.1 10*3/uL (ref 4.6–10.2)

## 2013-10-22 LAB — GLUCOSE, POCT (MANUAL RESULT ENTRY): POC Glucose: 119 mg/dl — AB (ref 70–99)

## 2013-10-22 LAB — POCT GLYCOSYLATED HEMOGLOBIN (HGB A1C): Hemoglobin A1C: 8.1

## 2013-10-22 MED ORDER — PHENAZOPYRIDINE HCL 200 MG PO TABS: 200.0000 mg | ORAL_TABLET | Freq: Three times a day (TID) | ORAL | Status: DC | PRN

## 2013-10-22 MED ORDER — SULFAMETHOXAZOLE-TMP DS 800-160 MG PO TABS: 1.0000 | ORAL_TABLET | Freq: Two times a day (BID) | ORAL | Status: DC

## 2013-10-22 NOTE — Patient Instructions (Signed)
Urinary Tract Infection  Urinary tract infections (UTIs) can develop anywhere along your urinary tract. Your urinary tract is your body's drainage system for removing wastes and extra water. Your urinary tract includes two kidneys, two ureters, a bladder, and a urethra. Your kidneys are a pair of bean-shaped organs. Each kidney is about the size of your fist. They are located below your ribs, one on each side of your spine.  CAUSES  Infections are caused by microbes, which are microscopic organisms, including fungi, viruses, and bacteria. These organisms are so small that they can only be seen through a microscope. Bacteria are the microbes that most commonly cause UTIs.  SYMPTOMS   Symptoms of UTIs may vary by age and gender of the patient and by the location of the infection. Symptoms in young women typically include a frequent and intense urge to urinate and a painful, burning feeling in the bladder or urethra during urination. Older women and men are more likely to be tired, shaky, and weak and have muscle aches and abdominal pain. A fever may mean the infection is in your kidneys. Other symptoms of a kidney infection include pain in your back or sides below the ribs, nausea, and vomiting.  DIAGNOSIS  To diagnose a UTI, your caregiver will ask you about your symptoms. Your caregiver also will ask to provide a urine sample. The urine sample will be tested for bacteria and white blood cells. White blood cells are made by your body to help fight infection.  TREATMENT   Typically, UTIs can be treated with medication. Because most UTIs are caused by a bacterial infection, they usually can be treated with the use of antibiotics. The choice of antibiotic and length of treatment depend on your symptoms and the type of bacteria causing your infection.  HOME CARE INSTRUCTIONS   If you were prescribed antibiotics, take them exactly as your caregiver instructs you. Finish the medication even if you feel better after you  have only taken some of the medication.   Drink enough water and fluids to keep your urine clear or pale yellow.   Avoid caffeine, tea, and carbonated beverages. They tend to irritate your bladder.   Empty your bladder often. Avoid holding urine for long periods of time.   Empty your bladder before and after sexual intercourse.   After a bowel movement, women should cleanse from front to back. Use each tissue only once.  SEEK MEDICAL CARE IF:    You have back pain.   You develop a fever.   Your symptoms do not begin to resolve within 3 days.  SEEK IMMEDIATE MEDICAL CARE IF:    You have severe back pain or lower abdominal pain.   You develop chills.   You have nausea or vomiting.   You have continued burning or discomfort with urination.  MAKE SURE YOU:    Understand these instructions.   Will watch your condition.   Will get help right away if you are not doing well or get worse.  Document Released: 03/23/2005 Document Revised: 12/13/2011 Document Reviewed: 07/22/2011  [employer] Patient Information 2014 [employer]

## 2013-10-22 NOTE — Progress Notes (Signed)
[facility] [address] [place] [address] [phone]  Date:  10/22/2013   Name:  [name]   DOB:  April 26, 1971   MRN:  [id number]  PCP:  No PCP Per Patient    Chief Complaint: Urinary Tract Infection and Diabetes   History of Present Illness:  [name] is a 43 y.o. very pleasant female patient who presents with the following:  History of change in her medications for diabetes.  Just changed from metformin to janumet.  Has dysuria and frequency and is concerned she may have a recurrent UTI.  No fever or chills.  FBS 120=160.  No abdominal pain or other complaints.     Patient Active Problem List   Diagnosis Date Noted  . Obesity 03/01/2012  . Diabetes mellitus 10/26/2011    Past Medical History  Diagnosis Date  . Diabetes mellitus     Past Surgical History  Procedure Laterality Date  . Knee surgery      History  Substance Use Topics  . Smoking status: Never Smoker   . Smokeless tobacco: Not on file  . Alcohol Use: No    Family History  Problem Relation Age of Onset  . Hypertension Mother   . Diabetes Father   . Hypertension Father   . Hypertension Maternal Grandmother   . Hypertension Maternal Grandfather   . Hypertension Paternal Grandmother   . Hypertension Paternal Grandfather     Allergies  Allergen Reactions  . Percocet [Oxycodone-Acetaminophen] Itching    Medication list has been reviewed and updated.  Current Outpatient Prescriptions on File Prior to Visit  Medication Sig Dispense Refill  . metFORMIN (GLUCOPHAGE) 1000 MG tablet Take 1 tablet (1,000 mg total) by mouth 2 (two) times daily with a meal. PATIENT NEEDS OFFICE VISIT FOR ADDITIONAL REFILLS -  2nd NOTICE  30 tablet  0   No current facility-administered medications on file prior to visit.    Review of Systems:  As per HPI, otherwise negative.    Physical Examination: Filed Vitals:   10/22/13 1732  BP: 134/82  Pulse: 114  Temp: 98 F  (36.7 C)   Filed Vitals:   10/22/13 1732  Height: 5' 2.75" (1.594 m)  Weight: 264 lb (119.75 kg)   Body mass index is 47.13 kg/(m^2). Ideal Body Weight: Weight in (lb) to have BMI = 25: 139.7  GEN: obese, NAD, Non-toxic, A & O x 3 HEENT: Atraumatic, Normocephalic. Neck supple. No masses, No LAD. Ears and Nose: No external deformity. CV: RRR, No M/G/R. No JVD. No thrill. No extra heart sounds. PULM: CTA B, no wheezes, crackles, rhonchi. No retractions. No resp. distress. No accessory muscle use. ABD: S, NT, ND, +BS. No rebound. No HSM. EXTR: No c/c/e NEURO Normal gait.  PSYCH: Normally interactive. Conversant. Not depressed or anxious appearing.  Calm demeanor.    Assessment and Plan: Cystitis NIDDM not fully controlled  Signed,  Ellison Carwin, MD   Results for orders placed in visit on 10/22/13  POCT GLYCOSYLATED HEMOGLOBIN (HGB A1C)      Result Value Ref Range   Hemoglobin A1C 8.1    GLUCOSE, POCT (MANUAL RESULT ENTRY)      Result Value Ref Range   POC Glucose 119 (*) 70 - 99 mg/dl  POCT URINALYSIS DIPSTICK      Result Value Ref Range   Color, UA yellow     Clarity, UA cloudy     Glucose, UA neg  Bilirubin, UA neg     Ketones, UA neg     Spec Grav, UA >=1.030     Blood, UA Moderate     pH, UA 5.5     Protein, UA 30mg      Urobilinogen, UA 0.2     Nitrite, UA neg     Leukocytes, UA Negative    POCT UA - MICROSCOPIC ONLY      Result Value Ref Range   WBC, Ur, HPF, POC 0-9     RBC, urine, microscopic 10-TNTC     Bacteria, U Microscopic 4+     Mucus, UA positive     Epithelial cells, urine per micros 8-15     Crystals, Ur, HPF, POC neg     Casts, Ur, LPF, POC neg     Yeast, UA neg    POCT CBC      Result Value Ref Range   WBC 9.1  4.6 - 10.2 K/uL   Lymph, poc 4.7 (*) 0.6 - 3.4   POC LYMPH PERCENT 51.6 (*) 10 - 50 %L   MID (cbc) 0.5  0 - 0.9   POC MID % 5.4  0 - 12 %M   POC Granulocyte 3.9  2 - 6.9   Granulocyte percent 43.0  37 - 80 %G   RBC  5.13  4.04 - 5.48 M/uL   Hemoglobin 12.4  12.2 - 16.2 g/dL   HCT, POC 39.8  37.7 - 47.9 %   MCV 77.5 (*) 80 - 97 fL   MCH, POC 24.2 (*) 27 - 31.2 pg   MCHC 31.2 (*) 31.8 - 35.4 g/dL   RDW, POC 21.3     Platelet Count, POC 288  142 - 424 K/uL   MPV 11.4  0 - 99.8 fL

## 2013-10-23 LAB — COMPREHENSIVE METABOLIC PANEL
ALT: 38 U/L — AB (ref 0–35)
AST: 31 U/L (ref 0–37)
Albumin: 4 g/dL (ref 3.5–5.2)
Alkaline Phosphatase: 64 U/L (ref 39–117)
BUN: 11 mg/dL (ref 6–23)
CHLORIDE: 101 meq/L (ref 96–112)
CO2: 28 meq/L (ref 19–32)
CREATININE: 0.6 mg/dL (ref 0.50–1.10)
Calcium: 9.9 mg/dL (ref 8.4–10.5)
Glucose, Bld: 109 mg/dL — ABNORMAL HIGH (ref 70–99)
Potassium: 4.2 mEq/L (ref 3.5–5.3)
Sodium: 137 mEq/L (ref 135–145)
Total Bilirubin: 0.3 mg/dL (ref 0.2–1.2)
Total Protein: 7.5 g/dL (ref 6.0–8.3)

## 2013-12-06 ENCOUNTER — Ambulatory Visit (INDEPENDENT_AMBULATORY_CARE_PROVIDER_SITE_OTHER): Payer: BC Managed Care – PPO | Admitting: Emergency Medicine

## 2013-12-06 VITALS — BP 128/80 | HR 113 | Temp 98.2°F | Resp 18 | Ht 63.0 in | Wt 262.0 lb

## 2013-12-06 DIAGNOSIS — M25569 Pain in unspecified knee: Secondary | ICD-10-CM

## 2013-12-06 DIAGNOSIS — N390 Urinary tract infection, site not specified: Secondary | ICD-10-CM

## 2013-12-06 DIAGNOSIS — E119 Type 2 diabetes mellitus without complications: Secondary | ICD-10-CM

## 2013-12-06 LAB — POCT UA - MICROSCOPIC ONLY
CASTS, UR, LPF, POC: NEGATIVE
Crystals, Ur, HPF, POC: NEGATIVE
MUCUS UA: NEGATIVE
WBC, Ur, HPF, POC: NEGATIVE
Yeast, UA: NEGATIVE

## 2013-12-06 LAB — POCT URINALYSIS DIPSTICK
Glucose, UA: NEGATIVE
Leukocytes, UA: NEGATIVE
Nitrite, UA: NEGATIVE
PH UA: 5
SPEC GRAV UA: 1.025
UROBILINOGEN UA: 0.2

## 2013-12-06 LAB — GLUCOSE, POCT (MANUAL RESULT ENTRY): POC Glucose: 179 mg/dl — AB (ref 70–99)

## 2013-12-06 LAB — POCT GLYCOSYLATED HEMOGLOBIN (HGB A1C): Hemoglobin A1C: 8.7

## 2013-12-06 MED ORDER — METFORMIN HCL 1000 MG PO TABS: 1000.0000 mg | ORAL_TABLET | Freq: Two times a day (BID) | ORAL | Status: DC

## 2013-12-06 NOTE — Progress Notes (Addendum)
Subjective:    Patient ID: [name], female    DOB: [birth year], 43 y.o.   MRN: [id number]  HPI HPI Comments: [name] is a 43 y.o. female who [facility] to the [facility] requesting a renewal or her handicap parking permit. Patient shares history of 6 knee surgeries. Her orthopedist is Dr. Percell Miller. States that she is unable to do much walking because it aggravates her knees, but she is able to exercise a pool.   Diabetes Patient is also requesting a diabetes check. Patient is taking Metformin 1000mg  daily, as directed. She endorses that she checks her sugar regularly and her levels are controlled, but her A1C levels remains high.   UTI Patient recently received Bactrim DS for a UTI. She had an allergic reaction and is requesting an alternative.   Patient Active Problem List   Diagnosis Date Noted  . Obesity 03/01/2012  . Diabetes mellitus 10/26/2011   Past Medical History  Diagnosis Date  . Diabetes mellitus    Past Surgical History  Procedure Laterality Date  . Knee surgery     Allergies  Allergen Reactions  . Bactrim [Sulfamethoxazole-Tmp Ds] Hives  . Percocet [Oxycodone-Acetaminophen] Itching   Prior to Admission medications   Medication Sig Start Date End Date Taking? Authorizing Provider  metFORMIN (GLUCOPHAGE) 1000 MG tablet Take 1 tablet (1,000 mg total) by mouth 2 (two) times daily with a meal. PATIENT NEEDS OFFICE VISIT FOR ADDITIONAL REFILLS -  2nd NOTICE 04/25/13  Yes Eleanore E Egan, PA-C  phenazopyridine (PYRIDIUM) 200 MG tablet Take 1 tablet (200 mg total) by mouth 3 (three) times daily as needed for pain. 10/22/13   Ellison Carwin, MD  sitaGLIPtin-metformin (JANUMET) 50-1000 MG per tablet Take 1 tablet by mouth 2 (two) times daily with a meal.    Historical Provider, MD  sulfamethoxazole-trimethoprim (BACTRIM DS) 800-160 MG per tablet Take 1 tablet by mouth 2 (two) times daily. 10/22/13   Ellison Carwin, MD   History    Social History  . Marital Status: Married    Spouse Name: N/A    Number of Children: N/A  . Years of Education: N/A   Occupational History  . Not on file.   Social History Main Topics  . Smoking status: Never Smoker   . Smokeless tobacco: Not on file  . Alcohol Use: No  . Drug Use: No  . Sexual Activity: Yes   Other Topics Concern  . Not on file   Social History Narrative  . No narrative on file      Review of Systems  Constitutional: Negative for fever.  Genitourinary: Negative for dysuria.       Objective:   Physical Exam  CONSTITUTIONAL: Well developed/well nourished HEAD: Normocephalic/atraumatic EYES: EOMI/PERRL ENMT: Mucous membranes moist NECK: supple no meningeal signs, no thyromegaly SPINE:entire spine nontender CV: S1/S2 noted, no murmurs/rubs/gallops noted LUNGS: Lungs are clear to auscultation bilaterally, no apparent distress ABDOMEN: soft, nontender, no rebound or guarding GU:no cva tenderness NEURO: Pt is awake/alert, moves all extremitiesx4 EXTREMITIES: pulses normal, full ROM; filament testing normal; surgical changes to both knees SKIN: warm, color normal PSYCH: no abnormalities of mood noted  Filed Vitals:   12/06/13 1110  BP: 128/80  Pulse: 113  Temp: 98.2 F (36.8 C)  Resp: 18   Results for orders placed in visit on 12/06/13  GLUCOSE, POCT (MANUAL RESULT ENTRY)      Result Value Ref Range   POC Glucose 179 (*) 70 -  99 mg/dl  POCT GLYCOSYLATED HEMOGLOBIN (HGB A1C)      Result Value Ref Range   Hemoglobin A1C 8.7          Assessment & Plan:  Meds are refilled. She currently is not control. She would do better if she was on  janumet but she cannot afford it. We'll recheck in 3 months. She needs to really work on weight loss. Patient is allergic to sulfa products. She therefore cannot takesulfonylurea. We'll have to stick with metformin 1 g twice a day. She, weight loss exercise recheck 3-4 months to

## 2013-12-07 LAB — BASIC METABOLIC PANEL
BUN: 11 mg/dL (ref 6–23)
CHLORIDE: 99 meq/L (ref 96–112)
CO2: 25 meq/L (ref 19–32)
CREATININE: 0.64 mg/dL (ref 0.50–1.10)
Calcium: 9.5 mg/dL (ref 8.4–10.5)
GLUCOSE: 185 mg/dL — AB (ref 70–99)
Potassium: 4.4 mEq/L (ref 3.5–5.3)
Sodium: 137 mEq/L (ref 135–145)

## 2013-12-07 LAB — MICROALBUMIN, URINE: Microalb, Ur: 3.8 mg/dL — ABNORMAL HIGH (ref 0.00–1.89)

## 2013-12-08 ENCOUNTER — Other Ambulatory Visit: Payer: Self-pay | Admitting: Radiology

## 2013-12-08 DIAGNOSIS — E119 Type 2 diabetes mellitus without complications: Secondary | ICD-10-CM

## 2013-12-08 LAB — URINE CULTURE: Colony Count: 9000

## 2013-12-08 MED ORDER — LOSARTAN POTASSIUM 50 MG PO TABS: 25.0000 mg | ORAL_TABLET | Freq: Every day | ORAL | Status: DC

## 2014-04-16 ENCOUNTER — Other Ambulatory Visit: Payer: Self-pay | Admitting: Family Medicine

## 2014-09-15 ENCOUNTER — Encounter (HOSPITAL_COMMUNITY): Payer: Self-pay | Admitting: Emergency Medicine

## 2014-09-15 ENCOUNTER — Emergency Department (HOSPITAL_COMMUNITY)
Admission: EM | Admit: 2014-09-15 | Discharge: 2014-09-16 | Disposition: A | Discharge status: Home / Self Care | Payer: Self-pay | Attending: Emergency Medicine | Admitting: Emergency Medicine

## 2014-09-15 ENCOUNTER — Emergency Department (HOSPITAL_COMMUNITY): Payer: Self-pay

## 2014-09-15 DIAGNOSIS — E119 Type 2 diabetes mellitus without complications: Secondary | ICD-10-CM | POA: Insufficient documentation

## 2014-09-15 DIAGNOSIS — Z79899 Other long term (current) drug therapy: Secondary | ICD-10-CM | POA: Insufficient documentation

## 2014-09-15 DIAGNOSIS — Z9889 Other specified postprocedural states: Secondary | ICD-10-CM | POA: Insufficient documentation

## 2014-09-15 DIAGNOSIS — M25562 Pain in left knee: Secondary | ICD-10-CM | POA: Insufficient documentation

## 2014-09-15 DIAGNOSIS — Z792 Long term (current) use of antibiotics: Secondary | ICD-10-CM | POA: Insufficient documentation

## 2014-09-15 MED ORDER — NAPROXEN 500 MG PO TABS
500.0000 mg | ORAL_TABLET | Freq: Once | ORAL | Status: AC
  Administered 2014-09-15: 500 mg via ORAL
  Filled 2014-09-15: qty 1

## 2014-09-15 NOTE — ED Provider Notes (Signed)
CSN: [id number]     Arrival date & time 09/15/14  2111 History  This chart was scribed for non-physician practitioner, Antonietta Breach, PA-C, working with Quintella Reichert, MD, by Jeanell Sparrow, ED Scribe. This patient was seen in room Welcome and the patient's care was started at 10:44 PM.   Chief Complaint  Patient presents with  . Knee Pain   Patient is a 44 y.o. female presenting with knee pain. The history is provided by the patient. No language interpreter was used.  Knee Pain Location:  Knee Time since incident:  1 day Injury: no   Knee location:  L knee Pain details:    Quality:  Burning   Severity:  Moderate   Duration:  1 day   Timing:  Constant   Progression:  Unchanged Chronicity:  New Dislocation: no   Foreign body present:  No foreign bodies Prior injury to area:  Yes Worsened by:  Bearing weight Ineffective treatments:  None tried Associated symptoms: no numbness    HPI Comments: [name] is a 44 y.o. female who presents to the Emergency Department complaining of constant moderate left knee pain that started today. She reports that she had a left knee injury about 20 years ago that needed surgery, and lately has been having pain. She denies any recent trauma. She describes the pain as a burning sensation. She states that ambulation exacerbates the pain. She reports no treatment PTA. She denies any loss of sensation in LE.   Past Medical History  Diagnosis Date  . Diabetes mellitus    Past Surgical History  Procedure Laterality Date  . Knee surgery     Family History  Problem Relation Age of Onset  . Hypertension Mother   . Diabetes Father   . Hypertension Father   . Hypertension Maternal Grandmother   . Hypertension Maternal Grandfather   . Hypertension Paternal Grandmother   . Hypertension Paternal Grandfather    History  Substance Use Topics  . Smoking status: Never Smoker   . Smokeless tobacco: Not on file  . Alcohol Use: No   OB History     No data available      Review of Systems  Musculoskeletal: Positive for myalgias and arthralgias.  All other systems reviewed and are negative.   Allergies  Bactrim and Percocet  Home Medications   Prior to Admission medications   Medication Sig Start Date End Date Taking? Authorizing Provider  losartan (COZAAR) 50 MG tablet Take 0.5 tablets (25 mg total) by mouth daily. 12/08/13   Darlyne Russian, MD  metFORMIN (GLUCOPHAGE) 1000 MG tablet TAKE ONE TABLET BY MOUTH TWICE DAILY 04/17/14   Mancel Bale, PA-C  naproxen (NAPROSYN) 500 MG tablet Take 1 tablet (500 mg total) by mouth 2 (two) times daily. 09/16/14   Antonietta Breach, PA-C  phenazopyridine (PYRIDIUM) 200 MG tablet Take 1 tablet (200 mg total) by mouth 3 (three) times daily as needed for pain. 10/22/13   Roselee Culver, MD  sitaGLIPtin-metformin (JANUMET) 50-1000 MG per tablet Take 1 tablet by mouth 2 (two) times daily with a meal.    Historical Provider, MD  sulfamethoxazole-trimethoprim (BACTRIM DS) 800-160 MG per tablet Take 1 tablet by mouth 2 (two) times daily. 10/22/13   Roselee Culver, MD   BP 179/97 mmHg  Pulse 116  Temp(Src) 97.8 F (36.6 C) (Oral)  Resp 16  SpO2 96%  Physical Exam  Constitutional: She is oriented to person, place, and time. She appears well-developed  and well-nourished. No distress.  Nontoxic/nonseptic appearing  HENT:  Head: Normocephalic and atraumatic.  Eyes: Conjunctivae and EOM are normal. No scleral icterus.  Neck: Normal range of motion.  Cardiovascular: Normal rate, regular rhythm and intact distal pulses.   DP and PT pulses 2+ in the left lower extremity  Pulmonary/Chest: Effort normal. No respiratory distress.  Respirations even and unlabored  Musculoskeletal: Normal range of motion.       Left knee: She exhibits normal range of motion, no swelling, no effusion, no deformity, no erythema, normal alignment, no LCL laxity, no bony tenderness and no MCL laxity. Tenderness (mild above  the patella on the lateral aspect) found.       Legs: Neurological: She is alert and oriented to person, place, and time. She exhibits normal muscle tone. Coordination normal.  Sensation to light touch intact. Normal patellar and Achilles reflexes in the left lower extremity. Patient ambulatory with steady gait.  Skin: Skin is warm and dry. No rash noted. She is not diaphoretic. No erythema. No pallor.  Psychiatric: She has a normal mood and affect. Her behavior is normal.  Nursing note and vitals reviewed.   ED Course  Procedures (including critical care time) DIAGNOSTIC STUDIES: Oxygen Saturation is 96% on RA, normal by my interpretation.    COORDINATION OF CARE: 10:48 PM- Pt advised of plan for treatment and pt agrees.  Labs Review Labs Reviewed - No data to display  Imaging Review Dg Knee Complete 4 Views Left  09/15/2014   CLINICAL DATA:  Medial left knee pain, acute onset. Difficulty with weight-bearing. Initial encounter.  EXAM: LEFT KNEE - COMPLETE 4+ VIEW  COMPARISON:  None.  FINDINGS: There is no evidence of fracture or dislocation. Screws are seen along the proximal tibia. The joint spaces are preserved. Significant cortical irregularity is noted along the articular surface of the patella, with associated sclerotic change. A fabella is seen.  Patella alta is noted; the proximal patellar tendon is not well assessed, raising question for underlying patellar tendinosis or tear. Would correlate for associated clinical symptoms.  No significant joint effusion is seen.  IMPRESSION: 1. No evidence of acute fracture or dislocation. 2. Patella alta noted, with poor characterization of the proximal aspect of the patellar tendon, raising question for underlying patellar tendinosis or tear. Would correlate for associated clinical symptoms. 3. 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   Electronically Signed   By: Garald Balding M.D.   On: 09/15/2014 23:50     EKG  Interpretation None      MDM   Final diagnoses:  Knee pain, acute, left    44 year old female presents to the emergency department for further evaluation of left knee pain. She has a history of knee surgery 26 years ago. Patient is neurovascularly intact. No evidence of septic joint. No trauma or injury inciting symptoms. X-ray today shows patella alta, but this appears consistent with x-rays of her right knee and is likely characteristic of patient's normal anatomy. No irregularity noted to hardware.  Patient to be referred to orthopedics for further evaluation of her symptoms. Advised NSAIDs and RICE. Patient with knee sleeve at home she can use. Return precautions discussed and provided. Patient agreeable to plan with no unaddressed concerns. Patient ambulated out of the ED in good condition.  I personally performed the services described in this documentation, which was scribed in my presence. The recorded information has been reviewed and is accurate.     Antonietta Breach, PA-C 09/16/14  [id number]  Quintella Reichert, MD 09/16/14 [phone]

## 2014-09-15 NOTE — ED Notes (Signed)
Pt repots left knee injury years ago. Pt states lately she has been having difficulty with left knee pain.

## 2014-09-16 MED ORDER — NAPROXEN 500 MG PO TABS: 500.0000 mg | ORAL_TABLET | Freq: Two times a day (BID) | ORAL | Status: DC

## 2014-09-16 NOTE — Discharge Instructions (Signed)
Arthralgia °Your caregiver has diagnosed you as suffering from an arthralgia. Arthralgia means there is pain in a joint. This can come from many reasons including: °· Bruising the joint which causes soreness (inflammation) in the joint. °· Wear and tear on the joints which occur as we grow older (osteoarthritis). °· Overusing the joint. °· Various forms of arthritis. °· Infections of the joint. °Regardless of the cause of pain in your joint, most of these different pains respond to anti-inflammatory drugs and rest. The exception to this is when a joint is infected, and these cases are treated with antibiotics, if it is a bacterial infection. °HOME CARE INSTRUCTIONS  °· Rest the injured area for as long as directed by your caregiver. Then slowly start using the joint as directed by your caregiver and as the pain allows. Crutches as directed may be useful if the ankles, knees or hips are involved. If the knee was splinted or casted, continue use and care as directed. If an stretchy or elastic wrapping bandage has been applied today, it should be removed and re-applied every 3 to 4 hours. It should not be applied tightly, but firmly enough to keep swelling down. Watch toes and feet for swelling, bluish discoloration, coldness, numbness or excessive pain. If any of these problems (symptoms) occur, remove the ace bandage and re-apply more loosely. If these symptoms persist, contact your caregiver or return to this location. °· For the first 24 hours, keep the injured extremity elevated on pillows while lying down. °· Apply ice for 15-20 minutes to the sore joint every couple hours while awake for the first half day. Then 03-04 times per day for the first 48 hours. Put the ice in a plastic bag and place a towel between the bag of ice and your skin. °· Wear any splinting, casting, elastic bandage applications, or slings as instructed. °· Only take over-the-counter or prescription medicines for pain, discomfort, or fever as  directed by your caregiver. Do not use aspirin immediately after the injury unless instructed by your physician. Aspirin can cause increased bleeding and bruising of the tissues. °· If you were given crutches, continue to use them as instructed and do not resume weight bearing on the sore joint until instructed. °Persistent pain and inability to use the sore joint as directed for more than 2 to 3 days are warning signs indicating that you should see a caregiver for a follow-up visit as soon as possible. Initially, a hairline fracture (break in bone) may not be evident on X-rays. Persistent pain and swelling indicate that further evaluation, non-weight bearing or use of the joint (use of crutches or slings as instructed), or further X-rays are indicated. X-rays may sometimes not show a small fracture until a week or 10 days later. Make a follow-up appointment with your own caregiver or one to whom we have referred you. A radiologist (specialist in reading X-rays) may read your X-rays. Make sure you know how you are to obtain your X-ray results. Do not assume everything is normal if you do not hear from us. °SEEK MEDICAL CARE IF: °Bruising, swelling, or pain increases. °SEEK IMMEDIATE MEDICAL CARE IF:  °· Your fingers or toes are numb or blue. °· The pain is not responding to medications and continues to stay the same or get worse. °· The pain in your joint becomes severe. °· You develop a fever over 102° F (38.9° C). °· It becomes impossible to move or use the joint. °MAKE SURE YOU:  °·   Understand these instructions. °· Will watch your condition. °· Will get help right away if you are not doing well or get worse. °Document Released: 06/13/2005 Document Revised: 09/05/2011 Document Reviewed: 01/30/2008 °[employer]® Patient Information ©2015 [employer]. This information is not intended to replace advice given to you by your health care provider. Make sure you discuss any questions you have with your health care  provider. ° °RICE: Routine Care for Injuries °The routine care of many injuries includes Rest, Ice, Compression, and Elevation (RICE). °HOME CARE INSTRUCTIONS °Rest is needed to allow your body to heal. Routine activities can usually be resumed when comfortable. Injured tendons and bones can take up to 6 weeks to heal. Tendons are the cord-like structures that attach muscle to bone. °Ice following an injury helps keep the swelling down and reduces pain. °Put ice in a plastic bag. °Place a towel between your skin and the bag. °Leave the ice on for 15-20 minutes, 3-4 times a day, or as directed by your health care provider. Do this while awake, for the first 24 to 48 hours. After that, continue as directed by your caregiver. °Compression helps keep swelling down. It also gives support and helps with discomfort. If an elastic bandage has been applied, it should be removed and reapplied every 3 to 4 hours. It should not be applied tightly, but firmly enough to keep swelling down. Watch fingers or toes for swelling, bluish discoloration, coldness, numbness, or excessive pain. If any of these problems occur, remove the bandage and reapply loosely. Contact your caregiver if these problems continue. °Elevation helps reduce swelling and decreases pain. With extremities, such as the arms, hands, legs, and feet, the injured area should be placed near or above the level of the heart, if possible. °SEEK IMMEDIATE MEDICAL CARE IF: °You have persistent pain and swelling. °You develop redness, numbness, or unexpected weakness. °Your symptoms are getting worse rather than improving after several days. °These symptoms may indicate that further evaluation or further X-rays are needed. Sometimes, X-rays may not show a small broken bone (fracture) until 1 week or 10 days later. Make a follow-up appointment with your caregiver. Ask when your X-ray results will be ready. Make sure you get your X-ray results. °Document Released: 09/25/2000  Document Revised: 06/18/2013 Document Reviewed: 11/12/2010 °[employer]® Patient Information ©2015 [employer]. This information is not intended to replace advice given to you by your health care provider. Make sure you discuss any questions you have with your health care provider. ° °

## 2015-09-21 ENCOUNTER — Inpatient Hospital Stay (HOSPITAL_COMMUNITY)
Admission: AD | Admit: 2015-09-21 | Discharge: 2015-09-21 | Disposition: A | Discharge status: Home / Self Care | Payer: Self-pay | Source: Ambulatory Visit | Attending: Family Medicine | Admitting: Family Medicine

## 2015-09-21 ENCOUNTER — Encounter (HOSPITAL_COMMUNITY): Payer: Self-pay | Admitting: *Deleted

## 2015-09-21 DIAGNOSIS — Z885 Allergy status to narcotic agent status: Secondary | ICD-10-CM | POA: Insufficient documentation

## 2015-09-21 DIAGNOSIS — R1030 Lower abdominal pain, unspecified: Secondary | ICD-10-CM | POA: Insufficient documentation

## 2015-09-21 DIAGNOSIS — Z791 Long term (current) use of non-steroidal anti-inflammatories (NSAID): Secondary | ICD-10-CM | POA: Insufficient documentation

## 2015-09-21 DIAGNOSIS — N939 Abnormal uterine and vaginal bleeding, unspecified: Secondary | ICD-10-CM | POA: Insufficient documentation

## 2015-09-21 DIAGNOSIS — D500 Iron deficiency anemia secondary to blood loss (chronic): Secondary | ICD-10-CM | POA: Insufficient documentation

## 2015-09-21 DIAGNOSIS — Z7984 Long term (current) use of oral hypoglycemic drugs: Secondary | ICD-10-CM | POA: Insufficient documentation

## 2015-09-21 DIAGNOSIS — E119 Type 2 diabetes mellitus without complications: Secondary | ICD-10-CM | POA: Insufficient documentation

## 2015-09-21 DIAGNOSIS — Z881 Allergy status to other antibiotic agents status: Secondary | ICD-10-CM | POA: Insufficient documentation

## 2015-09-21 DIAGNOSIS — Z8249 Family history of ischemic heart disease and other diseases of the circulatory system: Secondary | ICD-10-CM | POA: Insufficient documentation

## 2015-09-21 DIAGNOSIS — Z833 Family history of diabetes mellitus: Secondary | ICD-10-CM | POA: Insufficient documentation

## 2015-09-21 DIAGNOSIS — Z79899 Other long term (current) drug therapy: Secondary | ICD-10-CM | POA: Insufficient documentation

## 2015-09-21 LAB — URINALYSIS, ROUTINE W REFLEX MICROSCOPIC
BILIRUBIN URINE: NEGATIVE
Glucose, UA: NEGATIVE mg/dL
Ketones, ur: 15 mg/dL — AB
LEUKOCYTES UA: NEGATIVE
NITRITE: POSITIVE — AB
Protein, ur: 100 mg/dL — AB
pH: 5 (ref 5.0–8.0)

## 2015-09-21 LAB — URINE MICROSCOPIC-ADD ON
Bacteria, UA: NONE SEEN
WBC UA: NONE SEEN WBC/hpf (ref 0–5)

## 2015-09-21 LAB — CBC
HCT: 36.5 % (ref 36.0–46.0)
Hemoglobin: 12.3 g/dL (ref 12.0–15.0)
MCH: 29.6 pg (ref 26.0–34.0)
MCHC: 33.7 g/dL (ref 30.0–36.0)
MCV: 88 fL (ref 78.0–100.0)
PLATELETS: 241 10*3/uL (ref 150–400)
RBC: 4.15 MIL/uL (ref 3.87–5.11)
RDW: 14 % (ref 11.5–15.5)
WBC: 11.5 10*3/uL — AB (ref 4.0–10.5)

## 2015-09-21 LAB — POCT PREGNANCY, URINE: Preg Test, Ur: NEGATIVE

## 2015-09-21 MED ORDER — MEGESTROL ACETATE 40 MG PO TABS: 40.0000 mg | ORAL_TABLET | Freq: Two times a day (BID) | ORAL | Status: DC

## 2015-09-21 MED ORDER — FERROUS SULFATE 325 (65 FE) MG PO TABS: 325.0000 mg | ORAL_TABLET | Freq: Every day | ORAL | Status: DC

## 2015-09-21 NOTE — MAU Provider Note (Signed)
History     CSN: [id number]  Arrival date and time: 09/21/15 1100   First Provider Initiated Contact with Patient 09/21/15 1256      Chief Complaint  Patient presents with  . Abdominal Pain  . Vaginal Bleeding   HPI   [name] is a 45 y.o. female G3P0 presenting with heavy vaginal bleeding; this is the normal time for her menstrual cycle. She started her cycle last Tuesday; her cycles normally last 7 days. " I have not had an actual normal period for 3 years." She would have spotting every few months, however not a "normal period."  Patient is not using any form of contraception at this time.   Currently her bleeding remains heavy, this is concerning for her because she has been bleeding for 6 days and normally it is starting to lighten up by day 4 or 5.   She attests to mild, lower abdominal cramping that is consistent with the normal cramping she has.   OB History    Gravida Para Term Preterm AB TAB SAB Ectopic Multiple Living   3         3      Past Medical History  Diagnosis Date  . Diabetes mellitus     Past Surgical History  Procedure Laterality Date  . Knee surgery      Family History  Problem Relation Age of Onset  . Hypertension Mother   . Diabetes Father   . Hypertension Father   . Hypertension Maternal Grandmother   . Hypertension Maternal Grandfather   . Hypertension Paternal Grandmother   . Hypertension Paternal Grandfather     Social History  Substance Use Topics  . Smoking status: Never Smoker   . Smokeless tobacco: Not on file  . Alcohol Use: No    Allergies:  Allergies  Allergen Reactions  . Bactrim [Sulfamethoxazole-Trimethoprim] Hives  . Percocet [Oxycodone-Acetaminophen] Itching    Prescriptions prior to admission  Medication Sig Dispense Refill Last Dose  . glimepiride (AMARYL) 2 MG tablet Take 2 mg by mouth daily with breakfast.   09/21/2015 at Unknown time  . lisinopril (PRINIVIL,ZESTRIL) 5 MG tablet Take 5 mg by  mouth daily.   09/21/2015 at Unknown time  . Multiple Vitamin (MULTIVITAMIN WITH MINERALS) TABS tablet Take 1 tablet by mouth daily.   09/21/2015 at Unknown time  . sitaGLIPtin-metformin (JANUMET) 50-1000 MG per tablet Take 1 tablet by mouth 2 (two) times daily with a meal.   09/21/2015 at Unknown time  . [DISCONTINUED] naproxen (NAPROSYN) 500 MG tablet Take 1 tablet (500 mg total) by mouth 2 (two) times daily. 30 tablet 0   . [DISCONTINUED] phenazopyridine (PYRIDIUM) 200 MG tablet Take 1 tablet (200 mg total) by mouth 3 (three) times daily as needed for pain. 6 tablet 0 Not Taking  . [DISCONTINUED] sulfamethoxazole-trimethoprim (BACTRIM DS) 800-160 MG per tablet Take 1 tablet by mouth 2 (two) times daily. 20 tablet 0 Not Taking   Results for orders placed or performed during the hospital encounter of 09/21/15 (from the past 48 hour(s))  Urinalysis, Routine w reflex microscopic (not at [facility])     Status: Abnormal   Collection Time: 09/21/15 11:15 AM  Result Value Ref Range   Color, Urine AMBER (A) YELLOW    Comment: BIOCHEMICALS MAY BE AFFECTED BY COLOR   APPearance CLEAR CLEAR   Specific Gravity, Urine >1.030 (H) 1.005 - 1.030   pH 5.0 5.0 - 8.0   Glucose, UA NEGATIVE NEGATIVE mg/dL  Hgb urine dipstick LARGE (A) NEGATIVE   Bilirubin Urine NEGATIVE NEGATIVE   Ketones, ur 15 (A) NEGATIVE mg/dL   Protein, ur 100 (A) NEGATIVE mg/dL   Nitrite POSITIVE (A) NEGATIVE   Leukocytes, UA NEGATIVE NEGATIVE  Urine microscopic-add on     Status: Abnormal   Collection Time: 09/21/15 11:15 AM  Result Value Ref Range   Squamous Epithelial / LPF 6-30 (A) NONE SEEN   WBC, UA NONE SEEN 0 - 5 WBC/hpf   RBC / HPF TOO NUMEROUS TO COUNT 0 - 5 RBC/hpf   Bacteria, UA NONE SEEN NONE SEEN  CBC     Status: Abnormal   Collection Time: 09/21/15 11:35 AM  Result Value Ref Range   WBC 11.5 (H) 4.0 - 10.5 K/uL   RBC 4.15 3.87 - 5.11 MIL/uL   Hemoglobin 12.3 12.0 - 15.0 g/dL   HCT 36.5 36.0 - 46.0 %   MCV 88.0 78.0 -  100.0 fL   MCH 29.6 26.0 - 34.0 pg   MCHC 33.7 30.0 - 36.0 g/dL   RDW 14.0 11.5 - 15.5 %   Platelets 241 150 - 400 K/uL  Pregnancy, urine POC     Status: None   Collection Time: 09/21/15 12:12 PM  Result Value Ref Range   Preg Test, Ur NEGATIVE NEGATIVE    Comment:        THE SENSITIVITY OF THIS METHODOLOGY IS >24 mIU/mL     Review of Systems  Constitutional: Positive for malaise/fatigue. Negative for fever and chills.  Gastrointestinal: Positive for abdominal pain.  Genitourinary: Negative for dysuria, urgency, frequency and hematuria.  Neurological: Positive for dizziness.   Physical Exam   Blood pressure 103/67, pulse 115, temperature 98 F (36.7 C), temperature source Oral, resp. rate 18, height 5' 2.25" (1.581 m), weight 278 lb 6 oz (126.27 kg), last menstrual period 03/03/2013.  Physical Exam  Constitutional: She is oriented to person, place, and time. She appears well-developed and well-nourished. No distress.  HENT:  Head: Normocephalic.  Respiratory: Effort normal.  GI: Soft.  Genitourinary:  Speculum exam: Vagina - Small amount of dark red blood in the vaginal canal.  Cervix - + active bleeding, small, marble size clot noted at the introitus.  Bimanual exam: Cervix closed Uterus non tender, slightly enlarged  Adnexa non tender, no masses bilaterally Chaperone present for exam.  Musculoskeletal: Normal range of motion.  Neurological: She is alert and oriented to person, place, and time.  Skin: Skin is warm. She is not diaphoretic.  Psychiatric: Her behavior is normal.    MAU Course  Procedures  None  MDM  CBC   Assessment and Plan   A:  1. Abnormal uterine and vaginal bleeding, unspecified   2. 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 Referral sent to the [facility]; recommended appointment to be scheduled after pelvic US Bleeding  precautions Return to MAU if symptoms worsen   Lezlie Lye, NP 09/21/2015 2:00 PM

## 2015-09-21 NOTE — Discharge Instructions (Signed)
Abnormal Uterine Bleeding Abnormal uterine bleeding can affect women at various stages in life, including teenagers, women in their reproductive years, pregnant women, and women who have reached menopause. Several kinds of uterine bleeding are considered abnormal, including:  Bleeding or spotting between periods.   Bleeding after sexual intercourse.   Bleeding that is heavier or more than normal.   Periods that last longer than usual.  Bleeding after menopause.  Many cases of abnormal uterine bleeding are minor and simple to treat, while others are more serious. Any type of abnormal bleeding should be evaluated by your health care provider. Treatment will depend on the cause of the bleeding. HOME CARE INSTRUCTIONS Monitor your condition for any changes. The following actions may help to alleviate any discomfort you are experiencing:  Avoid the use of tampons and douches as directed by your health care provider.  Change your pads frequently. You should get regular pelvic exams and Pap tests. Keep all follow-up appointments for diagnostic tests as directed by your health care provider.  SEEK MEDICAL CARE IF:   Your bleeding lasts more than 1 week.   You feel dizzy at times.  SEEK IMMEDIATE MEDICAL CARE IF:   You pass out.   You are changing pads every 15 to 30 minutes.   You have abdominal pain.  You have a fever.   You become sweaty or weak.   You are passing large blood clots from the vagina.   You start to feel nauseous and vomit. MAKE SURE YOU:   Understand these instructions.  Will watch your condition.  Will get help right away if you are not doing well or get worse.   This information is not intended to replace advice given to you by your health care provider. Make sure you discuss any questions you have with your health care provider.   Document Released: 06/13/2005 Document Revised: 06/18/2013 Document Reviewed: 01/10/2013 Elsevier Interactive  Patient Education 2016 [employer].  Anemia, Nonspecific Anemia is a condition in which the concentration of red blood cells or hemoglobin in the blood is below normal. Hemoglobin is a substance in red blood cells that carries oxygen to the tissues of the body. Anemia results in not enough oxygen reaching these tissues.  CAUSES  Common causes of anemia include:   Excessive bleeding. Bleeding may be internal or external. This includes excessive bleeding from periods (in women) or from the intestine.   Poor nutrition.   Chronic kidney, thyroid, and liver disease.  Bone marrow disorders that decrease red blood cell production.  Cancer and treatments for cancer.  HIV, AIDS, and their treatments.  Spleen problems that increase red blood cell destruction.  Blood disorders.  Excess destruction of red blood cells due to infection, medicines, and autoimmune disorders. SIGNS AND SYMPTOMS   Minor weakness.   Dizziness.   Headache.  Palpitations.   Shortness of breath, especially with exercise.   Paleness.  Cold sensitivity.  Indigestion.  Nausea.  Difficulty sleeping.  Difficulty concentrating. Symptoms may occur suddenly or they may develop slowly.  DIAGNOSIS  Additional blood tests are often needed. These help your health care provider determine the best treatment. Your health care provider will check your stool for blood and look for other causes of blood loss.  TREATMENT  Treatment varies depending on the cause of the anemia. Treatment can include:   Supplements of iron, vitamin U23, or folic acid.   Hormone medicines.   A blood transfusion. This may be needed if blood loss  is severe.   Hospitalization. This may be needed if there is significant continual blood loss.   Dietary changes.  Spleen removal. HOME CARE INSTRUCTIONS Keep all follow-up appointments. It often takes many weeks to correct anemia, and having your health care provider check on  your condition and your response to treatment is very important. SEEK IMMEDIATE MEDICAL CARE IF:   You develop extreme weakness, shortness of breath, or chest pain.   You become dizzy or have trouble concentrating.  You develop heavy vaginal bleeding.   You develop a rash.   You have bloody or black, tarry stools.   You faint.   You vomit up blood.   You vomit repeatedly.   You have abdominal pain.  You have a fever or persistent symptoms for more than 2-3 days.   You have a fever and your symptoms suddenly get worse.   You are dehydrated.  MAKE SURE YOU:  Understand these instructions.  Will watch your condition.  Will get help right away if you are not doing well or get worse.   This information is not intended to replace advice given to you by your health care provider. Make sure you discuss any questions you have with your health care provider.   Document Released: 07/21/2004 Document Revised: 02/13/2013 Document Reviewed: 12/07/2012 Elsevier Interactive Patient Education [employer].

## 2015-09-21 NOTE — MAU Note (Signed)
Patient presents stating that she is not pregnant with c/o abdominal pain and vaginal bleeding that started last Monday and worsened last Wednesday.

## 2015-09-23 ENCOUNTER — Inpatient Hospital Stay (HOSPITAL_COMMUNITY)
Admission: AD | Admit: 2015-09-23 | Discharge: 2015-09-23 | Disposition: A | Discharge status: Home / Self Care | Payer: Self-pay | Source: Ambulatory Visit | Attending: Obstetrics & Gynecology | Admitting: Obstetrics & Gynecology

## 2015-09-23 ENCOUNTER — Encounter (HOSPITAL_COMMUNITY): Payer: Self-pay | Admitting: *Deleted

## 2015-09-23 DIAGNOSIS — Z79899 Other long term (current) drug therapy: Secondary | ICD-10-CM | POA: Insufficient documentation

## 2015-09-23 DIAGNOSIS — N939 Abnormal uterine and vaginal bleeding, unspecified: Secondary | ICD-10-CM | POA: Insufficient documentation

## 2015-09-23 DIAGNOSIS — E119 Type 2 diabetes mellitus without complications: Secondary | ICD-10-CM | POA: Insufficient documentation

## 2015-09-23 DIAGNOSIS — Z8249 Family history of ischemic heart disease and other diseases of the circulatory system: Secondary | ICD-10-CM | POA: Insufficient documentation

## 2015-09-23 DIAGNOSIS — Z882 Allergy status to sulfonamides status: Secondary | ICD-10-CM | POA: Insufficient documentation

## 2015-09-23 DIAGNOSIS — Z87440 Personal history of urinary (tract) infections: Secondary | ICD-10-CM | POA: Insufficient documentation

## 2015-09-23 DIAGNOSIS — I10 Essential (primary) hypertension: Secondary | ICD-10-CM | POA: Insufficient documentation

## 2015-09-23 DIAGNOSIS — N3000 Acute cystitis without hematuria: Secondary | ICD-10-CM | POA: Insufficient documentation

## 2015-09-23 DIAGNOSIS — Z833 Family history of diabetes mellitus: Secondary | ICD-10-CM | POA: Insufficient documentation

## 2015-09-23 DIAGNOSIS — Z7984 Long term (current) use of oral hypoglycemic drugs: Secondary | ICD-10-CM | POA: Insufficient documentation

## 2015-09-23 DIAGNOSIS — Z885 Allergy status to narcotic agent status: Secondary | ICD-10-CM | POA: Insufficient documentation

## 2015-09-23 HISTORY — DX: Essential (primary) hypertension: I10

## 2015-09-23 HISTORY — DX: Unspecified infectious disease: B99.9

## 2015-09-23 HISTORY — DX: Unspecified abnormal cytological findings in specimens from vagina: R87.629

## 2015-09-23 HISTORY — DX: Benign neoplasm of connective and other soft tissue, unspecified: D21.9

## 2015-09-23 LAB — CBC
HEMATOCRIT: 32.6 % — AB (ref 36.0–46.0)
Hemoglobin: 11.2 g/dL — ABNORMAL LOW (ref 12.0–15.0)
MCH: 30.2 pg (ref 26.0–34.0)
MCHC: 34.4 g/dL (ref 30.0–36.0)
MCV: 87.9 fL (ref 78.0–100.0)
PLATELETS: 204 10*3/uL (ref 150–400)
RBC: 3.71 MIL/uL — ABNORMAL LOW (ref 3.87–5.11)
RDW: 14 % (ref 11.5–15.5)
WBC: 10.4 10*3/uL (ref 4.0–10.5)

## 2015-09-23 LAB — URINE MICROSCOPIC-ADD ON

## 2015-09-23 LAB — URINALYSIS, ROUTINE W REFLEX MICROSCOPIC
BILIRUBIN URINE: NEGATIVE
GLUCOSE, UA: 250 mg/dL — AB
Ketones, ur: 15 mg/dL — AB
Nitrite: POSITIVE — AB
PH: 5 (ref 5.0–8.0)
Protein, ur: >300 mg/dL — AB
SPECIFIC GRAVITY, URINE: 1.025 (ref 1.005–1.030)

## 2015-09-23 LAB — POCT PREGNANCY, URINE: Preg Test, Ur: NEGATIVE

## 2015-09-23 MED ORDER — NITROFURANTOIN MONOHYD MACRO 100 MG PO CAPS: 100.0000 mg | ORAL_CAPSULE | Freq: Two times a day (BID) | ORAL | Status: DC

## 2015-09-23 MED ORDER — MEGESTROL ACETATE 40 MG PO TABS: 40.0000 mg | ORAL_TABLET | Freq: Three times a day (TID) | ORAL | Status: DC

## 2015-09-23 NOTE — MAU Provider Note (Signed)
History     CSN: [id number]  Arrival date and time: 09/23/15 [id number]   First Provider Initiated Contact with Patient 09/23/15 0914      Chief Complaint  Patient presents with  . Vaginal Bleeding   HPI  [name] is a 45 y.o. female who presents for vaginal bleeding & abdominal cramping. Seen in MAU 2 days ago for same symptoms. Was prescribed Megace 40 mg BID. Pt states bleeding decreased for a day but became heavy again this morning. Reports continued lower abdominal cramping "all month". Currently rates pain 6/10. Has not treated.  Some nausea. Denies vomiting, diarrhea, constipation, dysuria, fever/chills.  States she saturated 2 pads since this morning.  Denies headache, chest pain, SOB, palpitations, or dizziness.    OB History    Gravida Para Term Preterm AB TAB SAB Ectopic Multiple Living   3 3 3       3       Past Medical History  Diagnosis Date  . Diabetes mellitus   . Hypertension   . Infection     UTI  . Vaginal Pap smear, abnormal   . Fibroid     Past Surgical History  Procedure Laterality Date  . Knee surgery      Family History  Problem Relation Age of Onset  . Hypertension Mother   . Diabetes Father   . Hypertension Father   . Hypertension Maternal Grandmother   . Hypertension Maternal Grandfather   . Hypertension Paternal Grandmother   . Hypertension Paternal Grandfather     Social History  Substance Use Topics  . Smoking status: Never Smoker   . Smokeless tobacco: Never Used  . Alcohol Use: No    Allergies:  Allergies  Allergen Reactions  . Bactrim [Sulfamethoxazole-Trimethoprim] Hives  . Percocet [Oxycodone-Acetaminophen] Itching    Prescriptions prior to admission  Medication Sig Dispense Refill Last Dose  . ferrous sulfate 325 (65 FE) MG tablet Take 1 tablet (325 mg total) by mouth daily with breakfast. 30 tablet 0   . glimepiride (AMARYL) 2 MG tablet Take 2 mg by mouth daily with breakfast.   09/21/2015 at Unknown time  .  lisinopril (PRINIVIL,ZESTRIL) 5 MG tablet Take 5 mg by mouth daily.   09/21/2015 at Unknown time  . megestrol (MEGACE) 40 MG tablet Take 1 tablet (40 mg total) by mouth 2 (two) times daily. 28 tablet 0   . Multiple Vitamin (MULTIVITAMIN WITH MINERALS) TABS tablet Take 1 tablet by mouth daily.   09/21/2015 at Unknown time  . sitaGLIPtin-metformin (JANUMET) 50-1000 MG per tablet Take 1 tablet by mouth 2 (two) times daily with a meal.   09/21/2015 at Unknown time    Review of Systems  Constitutional: Negative for fever and chills.  Respiratory: Negative for shortness of breath.   Cardiovascular: Negative for chest pain and palpitations.  Gastrointestinal: Positive for nausea and abdominal pain. Negative for vomiting, diarrhea and constipation.  Genitourinary: Negative for dysuria, frequency and flank pain.       + vaginal bleeding  Neurological: Negative for dizziness and headaches.   Physical Exam   Blood pressure 101/52, pulse 110, temperature 98.4 F (36.9 C), temperature source Oral, resp. rate 20, height 5\' 3"  (1.6 m), weight 278 lb 9.6 oz (126.372 kg), last menstrual period 03/03/2013.   Physical Exam  Nursing note and vitals reviewed. Constitutional: She is oriented to person, place, and time. She appears well-developed and well-nourished. No distress.  HENT:  Head: Normocephalic and atraumatic.  Eyes: Conjunctivae  are normal. Right eye exhibits no discharge. Left eye exhibits no discharge. No scleral icterus.  Neck: Normal range of motion.  Cardiovascular: Normal rate, regular rhythm and normal heart sounds.   No murmur heard. Respiratory: Effort normal and breath sounds normal. No respiratory distress. She has no wheezes.  GI: Soft. Bowel sounds are normal. There is no tenderness.  Genitourinary: Cervix exhibits no motion tenderness and no friability. There is bleeding (small amount of dark red blood. Dark red blood slowly oozing from cervical os) in the vagina.  Uterus feels  slightly enlarged but bimanual exam made difficult by body habitus  Neurological: She is alert and oriented to person, place, and time.  Skin: Skin is warm and dry. She is not diaphoretic.  Psychiatric: She has a normal mood and affect. Her behavior is normal. Judgment and thought content normal.    MAU Course  Procedures Results for orders placed or performed during the hospital encounter of 09/23/15 (from the past 24 hour(s))  Urinalysis, Routine w reflex microscopic (not at [facility])     Status: Abnormal   Collection Time: 09/23/15  7:40 AM  Result Value Ref Range   Color, Urine RED (A) YELLOW   APPearance CLOUDY (A) CLEAR   Specific Gravity, Urine 1.025 1.005 - 1.030   pH 5.0 5.0 - 8.0   Glucose, UA 250 (A) NEGATIVE mg/dL   Hgb urine dipstick LARGE (A) NEGATIVE   Bilirubin Urine NEGATIVE NEGATIVE   Ketones, ur 15 (A) NEGATIVE mg/dL   Protein, ur >300 (A) NEGATIVE mg/dL   Nitrite POSITIVE (A) NEGATIVE   Leukocytes, UA TRACE (A) NEGATIVE  Urine microscopic-add on     Status: Abnormal   Collection Time: 09/23/15  7:40 AM  Result Value Ref Range   Squamous Epithelial / LPF 0-5 (A) NONE SEEN   WBC, UA 0-5 0 - 5 WBC/hpf   RBC / HPF TOO NUMEROUS TO COUNT 0 - 5 RBC/hpf   Bacteria, UA FEW (A) NONE SEEN  CBC     Status: Abnormal   Collection Time: 09/23/15  8:10 AM  Result Value Ref Range   WBC 10.4 4.0 - 10.5 K/uL   RBC 3.71 (L) 3.87 - 5.11 MIL/uL   Hemoglobin 11.2 (L) 12.0 - 15.0 g/dL   HCT 32.6 (L) 36.0 - 46.0 %   MCV 87.9 78.0 - 100.0 fL   MCH 30.2 26.0 - 34.0 pg   MCHC 34.4 30.0 - 36.0 g/dL   RDW 14.0 11.5 - 15.5 %   Platelets 204 150 - 400 K/uL  Pregnancy, urine POC     Status: None   Collection Time: 09/23/15  8:15 AM  Result Value Ref Range   Preg Test, Ur NEGATIVE NEGATIVE    MDM UPT negative Hemoglobin stable Will treat UTI & increase megace dosage Assessment and Plan  A: 1. Abnormal uterine bleeding (AUB)   2. Acute cystitis without hematuria      P: Discharge home Rx macrobid Increase Megace to 40 mg TID Discussed reasons to return Outpatient ultrasound & Clinic follow up already ordered  Jorje Guild 09/23/2015, 8:53 AM

## 2015-09-23 NOTE — MAU Note (Signed)
No period for 3 years no menstral. Came in Monday. Gave her pills to stop bleeding. Never fully stopped, was Spotting.  This morning getting dressed for work, started heavy bleeding.

## 2015-09-23 NOTE — Discharge Instructions (Signed)
Abnormal Uterine Bleeding °Abnormal uterine bleeding can affect women at various stages in life, including teenagers, women in their reproductive years, pregnant women, and women who have reached menopause. Several kinds of uterine bleeding are considered abnormal, including: °· Bleeding or spotting between periods.   °· Bleeding after sexual intercourse.   °· Bleeding that is heavier or more than normal.   °· Periods that last longer than usual. °· Bleeding after menopause.   °Many cases of abnormal uterine bleeding are minor and simple to treat, while others are more serious. Any type of abnormal bleeding should be evaluated by your health care provider. Treatment will depend on the cause of the bleeding. °HOME CARE INSTRUCTIONS °Monitor your condition for any changes. The following actions may help to alleviate any discomfort you are experiencing: °· Avoid the use of tampons and douches as directed by your health care provider. °· Change your pads frequently. °You should get regular pelvic exams and Pap tests. Keep all follow-up appointments for diagnostic tests as directed by your health care provider.  °SEEK MEDICAL CARE IF:  °· Your bleeding lasts more than 1 week.   °· You feel dizzy at times.   °SEEK IMMEDIATE MEDICAL CARE IF:  °· You pass out.   °· You are changing pads every 15 to 30 minutes.   °· You have abdominal pain. °· You have a fever.   °· You become sweaty or weak.   °· You are passing large blood clots from the vagina.   °· You start to feel nauseous and vomit. °MAKE SURE YOU:  °· Understand these instructions. °· Will watch your condition. °· Will get help right away if you are not doing well or get worse. °  °This information is not intended to replace advice given to you by your health care provider. Make sure you discuss any questions you have with your health care provider. °  °Document Released: 06/13/2005 Document Revised: 06/18/2013 Document Reviewed: 01/10/2013 °Elsevier Interactive  Patient Education ©2016 Elsevier Inc. ° °Urinary Tract Infection °Urinary tract infections (UTIs) can develop anywhere along your urinary tract. Your urinary tract is your body's drainage system for removing wastes and extra water. Your urinary tract includes two kidneys, two ureters, a bladder, and a urethra. Your kidneys are a pair of bean-shaped organs. Each kidney is about the size of your fist. They are located below your ribs, one on each side of your spine. °CAUSES °Infections are caused by microbes, which are microscopic organisms, including fungi, viruses, and bacteria. These organisms are so small that they can only be seen through a microscope. Bacteria are the microbes that most commonly cause UTIs. °SYMPTOMS  °Symptoms of UTIs may vary by age and gender of the patient and by the location of the infection. Symptoms in young women typically include a frequent and intense urge to urinate and a painful, burning feeling in the bladder or urethra during urination. Older women and men are more likely to be tired, shaky, and weak and have muscle aches and abdominal pain. A fever may mean the infection is in your kidneys. Other symptoms of a kidney infection include pain in your back or sides below the ribs, nausea, and vomiting. °DIAGNOSIS °To diagnose a UTI, your caregiver will ask you about your symptoms. Your caregiver will also ask you to provide a urine sample. The urine sample will be tested for bacteria and white blood cells. White blood cells are made by your body to help fight infection. °TREATMENT  °Typically, UTIs can be treated with medication. Because most UTIs   are caused by a bacterial infection, they usually can be treated with the use of antibiotics. The choice of antibiotic and length of treatment depend on your symptoms and the type of bacteria causing your infection. °HOME CARE INSTRUCTIONS °· If you were prescribed antibiotics, take them exactly as your caregiver instructs you. Finish the  medication even if you feel better after you have only taken some of the medication. °· Drink enough water and fluids to keep your urine clear or pale yellow. °· Avoid caffeine, tea, and carbonated beverages. They tend to irritate your bladder. °· Empty your bladder often. Avoid holding urine for long periods of time. °· Empty your bladder before and after sexual intercourse. °· After a bowel movement, women should cleanse from front to back. Use each tissue only once. °SEEK MEDICAL CARE IF:  °· You have back pain. °· You develop a fever. °· Your symptoms do not begin to resolve within 3 days. °SEEK IMMEDIATE MEDICAL CARE IF:  °· You have severe back pain or lower abdominal pain. °· You develop chills. °· You have nausea or vomiting. °· You have continued burning or discomfort with urination. °MAKE SURE YOU:  °· Understand these instructions. °· Will watch your condition. °· Will get help right away if you are not doing well or get worse. °  °This information is not intended to replace advice given to you by your health care provider. Make sure you discuss any questions you have with your health care provider. °  °Document Released: 03/23/2005 Document Revised: 03/04/2015 Document Reviewed: 07/22/2011 °Elsevier Interactive Patient Education ©2016 Elsevier Inc. ° °

## 2015-09-23 NOTE — MAU Note (Signed)
Was here on Monday, after 3 yrs of no cycle had started bleeding.   Came up here. Was to be scheduled for pelvic and [place]. Was given meds to help stop bleeding. Got up to get ready for work, and " the flood gates opened, was like her water broke".  Floods of blood, lots of cramping and large clots.

## 2015-09-24 LAB — URINE CULTURE

## 2015-10-02 ENCOUNTER — Ambulatory Visit (HOSPITAL_COMMUNITY)
Admission: RE | Admit: 2015-10-02 | Discharge: 2015-10-02 | Disposition: A | Discharge status: Home / Self Care | Payer: Self-pay | Source: Ambulatory Visit | Attending: Obstetrics and Gynecology | Admitting: Obstetrics and Gynecology

## 2015-10-02 DIAGNOSIS — D500 Iron deficiency anemia secondary to blood loss (chronic): Secondary | ICD-10-CM | POA: Insufficient documentation

## 2015-10-02 DIAGNOSIS — N939 Abnormal uterine and vaginal bleeding, unspecified: Secondary | ICD-10-CM | POA: Insufficient documentation

## 2015-10-02 DIAGNOSIS — N852 Hypertrophy of uterus: Secondary | ICD-10-CM | POA: Insufficient documentation

## 2015-10-05 ENCOUNTER — Telehealth: Payer: Self-pay | Admitting: General Practice

## 2015-10-05 NOTE — Telephone Encounter (Signed)
Called patient and informed her of u/s results & reminded her of appt on 4/17. Patient verbalized understanding & had no questions

## 2015-10-12 ENCOUNTER — Ambulatory Visit (INDEPENDENT_AMBULATORY_CARE_PROVIDER_SITE_OTHER): Payer: Self-pay | Admitting: Obstetrics & Gynecology

## 2015-10-12 DIAGNOSIS — N938 Other specified abnormal uterine and vaginal bleeding: Secondary | ICD-10-CM

## 2015-10-12 NOTE — Progress Notes (Signed)
Patient ID: [name], female   DOB: 1970-11-30, 45 y.o.   MRN: [id number]  CC: heavy bleeding  HPI [name] is a 45 y.o. female.  [id number] Patient's last menstrual period was 03/03/2013 (approximate). No regular period only monthly spotting for 2.5 years and then began bleeding heavily late March, stopped after about one week after taking Megace that was prescribed by [facility]. Used no BCM without conceiving for 14 years HPI  Past Medical History  Diagnosis Date  . Diabetes mellitus   . Hypertension   . Infection     UTI  . Vaginal Pap smear, abnormal   . Fibroid     Past Surgical History  Procedure Laterality Date  . Knee surgery      Family History  Problem Relation Age of Onset  . Hypertension Mother   . Diabetes Father   . Hypertension Father   . Hypertension Maternal Grandmother   . Hypertension Maternal Grandfather   . Hypertension Paternal Grandmother   . Hypertension Paternal Grandfather     Social History Social History  Substance Use Topics  . Smoking status: Never Smoker   . Smokeless tobacco: Never Used  . Alcohol Use: No    Allergies  Allergen Reactions  . Bactrim [Sulfamethoxazole-Trimethoprim] Hives  . Percocet [Oxycodone-Acetaminophen] Itching    Current Outpatient Prescriptions  Medication Sig Dispense Refill  . ferrous sulfate 325 (65 FE) MG tablet Take 1 tablet (325 mg total) by mouth daily with breakfast. 30 tablet 0  . glimepiride (AMARYL) 2 MG tablet Take 2 mg by mouth daily with breakfast.    . lisinopril (PRINIVIL,ZESTRIL) 5 MG tablet Take 5 mg by mouth daily.    . megestrol (MEGACE) 40 MG tablet Take 1 tablet (40 mg total) by mouth 3 (three) times daily. 90 tablet 0  . Multiple Vitamin (MULTIVITAMIN WITH MINERALS) TABS tablet Take 1 tablet by mouth daily.    . nitrofurantoin, macrocrystal-monohydrate, (MACROBID) 100 MG capsule Take 1 capsule (100 mg total) by mouth 2 (two) times daily. 10 capsule 0  . sitaGLIPtin-metformin  (JANUMET) 50-1000 MG per tablet Take 1 tablet by mouth 2 (two) times daily with a meal.     No current facility-administered medications for this visit.    Review of Systems Review of Systems  Constitutional: Negative.   Genitourinary: Positive for menstrual problem. Negative for vaginal bleeding and vaginal discharge.    Last menstrual period 03/03/2013. Last menstrual period 03/03/2013.  Physical Exam Physical Exam  Constitutional: She is oriented to person, place, and time. She appears well-developed. No distress.  obese  Pulmonary/Chest: Effort normal.  Neurological: She is alert and oriented to person, place, and time.  Skin: No pallor.  Psychiatric: She has a normal mood and affect. Her behavior is normal.    Data Reviewed CLINICAL DATA: Abnormal uterine bleeding. 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 Morbid obesity. LMP 09/15/2015.  EXAM: TRANSABDOMINAL AND TRANSVAGINAL ULTRASOUND OF PELVIS  TECHNIQUE: Both transabdominal and transvaginal ultrasound examinations of the pelvis were performed. Transabdominal technique was performed for global imaging of the pelvis including uterus, ovaries, adnexal regions, and pelvic cul-de-sac. It was necessary to proceed with endovaginal exam following the transabdominal exam to visualize the endometrium and ovaries.  COMPARISON: None  FINDINGS: Uterus  Measurements: 16.6 x 9.7 x 10.4 cm. Diffusely heterogeneous myometrial echotexture, however no distinct fibroids identified.  Endometrium  Thickness: 10 mm in fundal region. However, there appears to be more focal thickening of the endometrium in the lower uterine  segment measuring approximately 26 mm on sagittal cine clip. Focal endometrial polyp or mass cannot be excluded.  Right ovary  Measurements: 2.6 x 1.2 x 2.0 cm. Normal appearance/no adnexal mass.  Left ovary  Measurements: 2.8 x 1.6 x 2.2 cm. Normal appearance/no adnexal mass.  Other  findings  No abnormal free fluid.  IMPRESSION: Enlarged uterus with diffusely heterogeneous myometrium, but no distinct fibroids. Adenomyosis cannot be excluded.  Question focal endometrial thickening or mass in the lower uterine segment. Consider further evaluation with follow-up transvaginal pelvic ultrasound in 6-12 weeks in the week immediately following menses, or pelvic MRI without and with contrast.   Electronically Signed  By: Earle Gell M.D.  On: 10/02/2015 15:31  CBC    Component Value Date/Time   WBC 10.4 09/23/2015 0810   WBC 9.1 10/22/2013 1814   RBC 3.71* 09/23/2015 0810   RBC 5.13 10/22/2013 1814   HGB 11.2* 09/23/2015 0810   HGB 12.4 10/22/2013 1814   HCT 32.6* 09/23/2015 0810   HCT 39.8 10/22/2013 1814   PLT 204 09/23/2015 0810   MCV 87.9 09/23/2015 0810   MCV 77.5* 10/22/2013 1814   MCH 30.2 09/23/2015 0810   MCH 24.2* 10/22/2013 1814   MCHC 34.4 09/23/2015 0810   MCHC 31.2* 10/22/2013 1814   RDW 14.0 09/23/2015 0810   LYMPHSABS 2.8 08/19/2010 0931   MONOABS 0.8 08/19/2010 0931   EOSABS 0.1 08/19/2010 0931   BASOSABS 0.1 08/19/2010 0931      Assessment    DUB, possibly anovulatory bleeding Possible adenomyosis     Plan    Megace as prescribed [place] today RTC 6 weeks, consider IUD or HRT        [name] 10/12/2015, 1:30 PM

## 2015-10-12 NOTE — Patient Instructions (Signed)

## 2015-10-13 LAB — FOLLICLE STIMULATING HORMONE: FSH: 9.2 m[IU]/mL

## 2015-10-15 ENCOUNTER — Telehealth: Payer: Self-pay | Admitting: General Practice

## 2015-10-15 NOTE — Telephone Encounter (Signed)
Per Dr Roselie Awkward, patient's [facility] level does not indicate menopause. Called patient several times but patient was unable to hear me.

## 2015-10-20 NOTE — Telephone Encounter (Signed)
Spoke with patient - she is aware of lab results.

## 2015-11-06 ENCOUNTER — Ambulatory Visit (HOSPITAL_COMMUNITY)
Admission: EM | Admit: 2015-11-06 | Discharge: 2015-11-06 | Disposition: A | Discharge status: Home / Self Care | Payer: BLUE CROSS/BLUE SHIELD | Attending: Family Medicine | Admitting: Family Medicine

## 2015-11-06 ENCOUNTER — Encounter (HOSPITAL_COMMUNITY): Payer: Self-pay | Admitting: Emergency Medicine

## 2015-11-06 DIAGNOSIS — Z79899 Other long term (current) drug therapy: Secondary | ICD-10-CM | POA: Insufficient documentation

## 2015-11-06 DIAGNOSIS — M549 Dorsalgia, unspecified: Secondary | ICD-10-CM | POA: Diagnosis present

## 2015-11-06 DIAGNOSIS — N390 Urinary tract infection, site not specified: Secondary | ICD-10-CM | POA: Insufficient documentation

## 2015-11-06 DIAGNOSIS — N938 Other specified abnormal uterine and vaginal bleeding: Secondary | ICD-10-CM | POA: Insufficient documentation

## 2015-11-06 DIAGNOSIS — R319 Hematuria, unspecified: Secondary | ICD-10-CM | POA: Diagnosis not present

## 2015-11-06 DIAGNOSIS — Z7984 Long term (current) use of oral hypoglycemic drugs: Secondary | ICD-10-CM | POA: Insufficient documentation

## 2015-11-06 LAB — POCT URINALYSIS DIP (DEVICE)
Bilirubin Urine: NEGATIVE
GLUCOSE, UA: 500 mg/dL — AB
Ketones, ur: NEGATIVE mg/dL
Leukocytes, UA: NEGATIVE
NITRITE: NEGATIVE
PH: 6 (ref 5.0–8.0)
PROTEIN: 100 mg/dL — AB
Specific Gravity, Urine: 1.025 (ref 1.005–1.030)
Urobilinogen, UA: 1 mg/dL (ref 0.0–1.0)

## 2015-11-06 MED ORDER — MEGESTROL ACETATE 40 MG PO TABS: 40.0000 mg | ORAL_TABLET | Freq: Three times a day (TID) | ORAL | Status: DC

## 2015-11-06 MED ORDER — CIPROFLOXACIN HCL 500 MG PO TABS: 500.0000 mg | ORAL_TABLET | Freq: Two times a day (BID) | ORAL | Status: DC

## 2015-11-06 NOTE — Discharge Instructions (Signed)
Will cover you for a UTI and send for culture. Drink plenty of water. If you worsen f/u in the ED. Otherwise refilled your medication. Good luck.    Abnormal Uterine Bleeding Abnormal uterine bleeding can affect women at various stages in life, including teenagers, women in their reproductive years, pregnant women, and women who have reached menopause. Several kinds of uterine bleeding are considered abnormal, including:  Bleeding or spotting between periods.   Bleeding after sexual intercourse.   Bleeding that is heavier or more than normal.   Periods that last longer than usual.  Bleeding after menopause.  Many cases of abnormal uterine bleeding are minor and simple to treat, while others are more serious. Any type of abnormal bleeding should be evaluated by your health care provider. Treatment will depend on the cause of the bleeding. HOME CARE INSTRUCTIONS Monitor your condition for any changes. The following actions may help to alleviate any discomfort you are experiencing:  Avoid the use of tampons and douches as directed by your health care provider.  Change your pads frequently. You should get regular pelvic exams and Pap tests. Keep all follow-up appointments for diagnostic tests as directed by your health care provider.  SEEK MEDICAL CARE IF:   Your bleeding lasts more than 1 week.   You feel dizzy at times.  SEEK IMMEDIATE MEDICAL CARE IF:   You pass out.   You are changing pads every 15 to 30 minutes.   You have abdominal pain.  You have a fever.   You become sweaty or weak.   You are passing large blood clots from the vagina.   You start to feel nauseous and vomit. MAKE SURE YOU:   Understand these instructions.  Will watch your condition.  Will get help right away if you are not doing well or get worse.   This information is not intended to replace advice given to you by your health care provider. Make sure you discuss any questions you have  with your health care provider.   Document Released: 06/13/2005 Document Revised: 06/18/2013 Document Reviewed: 01/10/2013 Elsevier Interactive Patient Education 2016 Else[employer]  Urinary Tract Infection A urinary tract infection (UTI) can occur any place along the urinary tract. The tract includes the kidneys, ureters, bladder, and urethra. A type of germ called bacteria often causes a UTI. UTIs are often helped with antibiotic medicine.  HOME CARE   If given, take antibiotics as told by your doctor. Finish them even if you start to feel better.  Drink enough fluids to keep your pee (urine) clear or pale yellow.  Avoid tea, drinks with caffeine, and bubbly (carbonated) drinks.  Pee often. Avoid holding your pee in for a long time.  Pee before and after having sex (intercourse).  Wipe from front to back after you poop (bowel movement) if you are a woman. Use each tissue only once. GET HELP RIGHT AWAY IF:   You have back pain.  You have lower belly (abdominal) pain.  You have chills.  You feel sick to your stomach (nauseous).  You throw up (vomit).  Your burning or discomfort with peeing does not go away.  You have a fever.  Your symptoms are not better in 3 days. MAKE SURE YOU:   Understand these instructions.  Will watch your condition.  Will get help right away if you are not doing well or get worse.   This information is not intended to replace advice given to you by your health care  provider. Make sure you discuss any questions you have with your health care provider.   Document Released: 11/30/2007 Document Revised: 07/04/2014 Document Reviewed: 01/12/2012 Elsevier Interactive Patient Education [employer].

## 2015-11-06 NOTE — ED Provider Notes (Signed)
CSN: [id number]     Arrival date & time 11/06/15  1851 History   First MD Initiated Contact with Patient 11/06/15 1902     Chief Complaint  Patient presents with  . Back Pain  . Hematuria   (Consider location/radiation/quality/duration/timing/severity/associated sxs/prior Treatment) HPI Comments: Patient presents with flank discomfort, urgency and hematuria. She was diagnosed with a UTI 1 month ago and treated, but feels like it is back. She has no fever or chills. No N, V. She also asked for a refill of her Megace if possible. She has f/u GYN appt on 05/25.   Patient is a 45 y.o. female presenting with back pain and hematuria. The history is provided by the patient.  Back Pain Associated symptoms: dysuria and pelvic pain   Associated symptoms: no fever   Hematuria    Past Medical History  Diagnosis Date  . Diabetes mellitus   . Hypertension   . Infection     UTI  . Vaginal Pap smear, abnormal   . Fibroid    Past Surgical History  Procedure Laterality Date  . Knee surgery     Family History  Problem Relation Age of Onset  . Hypertension Mother   . Diabetes Father   . Hypertension Father   . Hypertension Maternal Grandmother   . Hypertension Maternal Grandfather   . Hypertension Paternal Grandmother   . Hypertension Paternal Grandfather    Social History  Substance Use Topics  . Smoking status: Never Smoker   . Smokeless tobacco: Never Used  . Alcohol Use: No   OB History    Gravida Para Term Preterm AB TAB SAB Ectopic Multiple Living   3 3 3       3      Review of Systems  Constitutional: Negative for fever and chills.  Genitourinary: Positive for dysuria, hematuria, flank pain and pelvic pain. Negative for difficulty urinating.  Musculoskeletal: Positive for back pain.    Allergies  Bactrim and Percocet  Home Medications   Prior to Admission medications   Medication Sig Start Date End Date Taking? Authorizing Provider  glimepiride (AMARYL) 2 MG tablet  Take 2 mg by mouth daily with breakfast.   Yes Historical Provider, MD  lisinopril (PRINIVIL,ZESTRIL) 5 MG tablet Take 5 mg by mouth daily.   Yes Historical Provider, MD  sitaGLIPtin-metformin (JANUMET) 50-1000 MG per tablet Take 1 tablet by mouth 2 (two) times daily with a meal.   Yes Historical Provider, MD  ciprofloxacin (CIPRO) 500 MG tablet Take 1 tablet (500 mg total) by mouth every 12 (twelve) hours. 11/06/15   Bjorn Pippin, PA-C  ferrous sulfate 325 (65 FE) MG tablet Take 1 tablet (325 mg total) by mouth daily with breakfast. 09/21/15   Lezlie Lye, NP  megestrol (MEGACE) 40 MG tablet Take 1 tablet (40 mg total) by mouth 3 (three) times daily. 11/06/15   Bjorn Pippin, PA-C  Multiple Vitamin (MULTIVITAMIN WITH MINERALS) TABS tablet Take 1 tablet by mouth daily.    Historical Provider, MD   Meds Ordered and Administered this Visit  Medications - No data to display  BP 122/90 mmHg  Pulse 116  Temp(Src) 98 F (36.7 C) (Oral)  Resp 18  SpO2 98%  LMP 10/02/2015 (Exact Date) No data found.   Physical Exam  Constitutional: She is oriented to person, place, and time. She appears well-developed and well-nourished. No distress.  Abdominal: Soft. She exhibits no distension. There is no tenderness. There is no rebound and no  guarding.  Neurological: She is alert and oriented to person, place, and time.  Skin: Skin is warm and dry. She is not diaphoretic.  Psychiatric: Her behavior is normal.  Nursing note and vitals reviewed.   ED Course  Procedures (including critical care time)  Labs Review Labs Reviewed  POCT URINALYSIS DIP (DEVICE) - Abnormal; Notable for the following:    Glucose, UA 500 (*)    Hgb urine dipstick MODERATE (*)    Protein, ur 100 (*)    All other components within normal limits  URINE CULTURE    Imaging Review No results found.   Visual Acuity Review  Right Eye Distance:   Left Eye Distance:   Bilateral Distance:    Right Eye Near:   Left  Eye Near:    Bilateral Near:         MDM   1. UTI (lower urinary tract infection)   2. DUB (dysfunctional uterine bleeding)    1. No leuks, will send for culture and treat empirically with Cipro. Previous Macrobid. Increase fluids.  2. F/U with GYN -refill given until f/u appt.     Bjorn Pippin, PA-C 11/06/15 1950

## 2015-11-06 NOTE — ED Notes (Signed)
The patient presented to the [facility] with a complaint of lower back pain and hematuria x 2 weeks.

## 2015-11-07 LAB — URINE CULTURE

## 2015-11-10 ENCOUNTER — Telehealth (HOSPITAL_COMMUNITY): Payer: Self-pay | Admitting: Emergency Medicine

## 2015-11-10 NOTE — ED Notes (Signed)
Called pt and notified of recent lab results from visit 5/12 Pt ID'd properly... Reports feeling "a whole lot better"  Per Dr. Valere Dross,  Urine culture not consistent with UTI. She was given cipro at her visit. Okay to finish course.  Adv pt if sx are not getting better to return  Pt verb understanding

## 2015-11-25 ENCOUNTER — Ambulatory Visit (INDEPENDENT_AMBULATORY_CARE_PROVIDER_SITE_OTHER): Payer: BLUE CROSS/BLUE SHIELD | Admitting: Obstetrics & Gynecology

## 2015-11-25 ENCOUNTER — Encounter: Payer: Self-pay | Admitting: General Practice

## 2015-11-25 ENCOUNTER — Other Ambulatory Visit (HOSPITAL_COMMUNITY)
Admission: RE | Admit: 2015-11-25 | Discharge: 2015-11-25 | Disposition: A | Discharge status: Home / Self Care | Payer: BLUE CROSS/BLUE SHIELD | Source: Ambulatory Visit | Attending: Obstetrics & Gynecology | Admitting: Obstetrics & Gynecology

## 2015-11-25 VITALS — BP 134/82 | HR 76 | Wt 272.8 lb

## 2015-11-25 DIAGNOSIS — N939 Abnormal uterine and vaginal bleeding, unspecified: Secondary | ICD-10-CM | POA: Insufficient documentation

## 2015-11-25 DIAGNOSIS — Z124 Encounter for screening for malignant neoplasm of cervix: Secondary | ICD-10-CM | POA: Diagnosis not present

## 2015-11-25 DIAGNOSIS — Z3202 Encounter for pregnancy test, result negative: Secondary | ICD-10-CM

## 2015-11-25 LAB — POCT PREGNANCY, URINE: PREG TEST UR: NEGATIVE

## 2015-11-25 MED ORDER — MEGESTROL ACETATE 40 MG PO TABS: 40.0000 mg | ORAL_TABLET | Freq: Two times a day (BID) | ORAL | Status: DC

## 2015-11-25 NOTE — Progress Notes (Signed)
Patient ID: [name], female   DOB: 1971-02-25, 45 y.o.   MRN: [id number] History:  45 y.o. G3P3003 here today for eval of AUB. Pt reports no menses for 3 years then 1 month ago bleeding starting and was extremely heavy.    The following portions of the patient's history were reviewed and updated as appropriate: allergies, current medications, past family history, past medical history, past social history, past surgical history and problem list.  Review of Systems:  Pertinent items are noted in HPI.  Objective:  Physical Exam Blood pressure 134/82, pulse 76, weight 272 lb 12.8 oz (123.741 kg), last menstrual period 10/02/2015. Gen: NAD Abd: Soft, nontender and nondistended; obese Pelvic: Normal appearing external genitalia; normal appearing vaginal mucosa and cervix.  Normal discharge.  Enlarged uterus, no other palpable masses, no uterine or adnexal tenderness   The indications for endometrial biopsy were reviewed.   Risks of the biopsy including cramping, bleeding, infection, uterine perforation, inadequate specimen and need for additional procedures  were discussed. The patient states she understands and agrees to undergo procedure today. Consent was signed. Time out was performed. Urine HCG was negative. A sterile speculum was placed in the patient's vagina and the cervix was prepped with Betadine. A single-toothed tenaculum was placed on the anterior lip of the cervix to stabilize it. The 3 mm pipelle was introduced into the endometrial cavity without difficulty to a depth of 12cm, and a moderate amount of tissue was obtained and sent to pathology. The instruments were removed from the patient's vagina. Minimal bleeding from the cervix was noted. The patient tolerated the procedure well.   Labs and Imaging 10/02/2015 CLINICAL DATA: Abnormal uterine bleeding. 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 Morbid obesity. LMP 09/15/2015.  EXAM: TRANSABDOMINAL AND TRANSVAGINAL ULTRASOUND OF  PELVIS  TECHNIQUE: Both transabdominal and transvaginal ultrasound examinations of the pelvis were performed. Transabdominal technique was performed for global imaging of the pelvis including uterus, ovaries, adnexal regions, and pelvic cul-de-sac. It was necessary to proceed with endovaginal exam following the transabdominal exam to visualize the endometrium and ovaries.  COMPARISON: None  FINDINGS: Uterus  Measurements: 16.6 x 9.7 x 10.4 cm. Diffusely heterogeneous myometrial echotexture, however no distinct fibroids identified.  Endometrium  Thickness: 10 mm in fundal region. However, there appears to be more focal thickening of the endometrium in the lower uterine segment measuring approximately 26 mm on sagittal cine clip. Focal endometrial polyp or mass cannot be excluded.  Right ovary  Measurements: 2.6 x 1.2 x 2.0 cm. Normal appearance/no adnexal mass.  Left ovary  Measurements: 2.8 x 1.6 x 2.2 cm. Normal appearance/no adnexal mass.  Other findings  No abnormal free fluid.  IMPRESSION: Enlarged uterus with diffusely heterogeneous myometrium, but no distinct fibroids. Adenomyosis cannot be excluded.  Question focal endometrial thickening or mass in the lower uterine segment. Consider further evaluation with follow-up transvaginal pelvic ultrasound in 6-12 weeks in the week immediately following menses, or pelvic MRI without and with contrast.   Assessment & Plan:  AUB-    F/u PAP F/u endo bx path Routine post-procedure instructions were given to the patient. The patient will follow up to review the results and for further management.    Labs: TSH Megace 40mg  bid  F/u in 2-4 week for results  Sabastien Tyler L. Harraway-Smith, M.D., Cherlynn June

## 2015-11-25 NOTE — Patient Instructions (Addendum)
Endometrial Biopsy, Care After Refer to this sheet in the next few weeks. These instructions provide you with information on caring for yourself after your procedure. Your health care provider may also give you more specific instructions. Your treatment has been planned according to current medical practices, but problems sometimes occur. Call your health care provider if you have any problems or questions after your procedure. WHAT TO EXPECT AFTER THE PROCEDURE After your procedure, it is typical to have the following:  You may have mild cramping and a small amount of vaginal bleeding for a few days after the procedure. This is normal. HOME CARE INSTRUCTIONS  Only take over-the-counter or prescription medicine as directed by your health care provider.  Do not douche, use tampons, or have sexual intercourse until your health care provider approves.  Follow your health care provider's instructions regarding any activity restrictions, such as strenuous exercise or heavy lifting. SEEK MEDICAL CARE IF:  You have heavy bleeding or bleeding longer than 2 days after the procedure.  You have bad smelling drainage from your vagina.  You have a fever and chills.  Youhave severe lower stomach (abdominal) pain. SEEK IMMEDIATE MEDICAL CARE IF:  You have severe cramps in your stomach or back.  You pass large blood clots.  Your bleeding increases.  You become weak or lightheaded, or you pass out.   This information is not intended to replace advice given to you by your health care provider. Make sure you discuss any questions you have with your health care provider.   Document Released: 04/03/2013 Document Reviewed: 04/03/2013 Elsevier Interactive Patient Education 2016 [employer]. Laparoscopically Assisted Vaginal Hysterectomy A laparoscopically assisted vaginal hysterectomy (LAVH) is a surgical procedure to remove the uterus and cervix, and sometimes the ovaries and fallopian tubes.  During an LAVH, some of the surgical removal is done through the vagina, and the rest is done through a few small surgical cuts (incisions) in the abdomen.  This procedure is usually considered in women when a vaginal hysterectomy is not an option. Your health care provider will discuss the risks and benefits of the different surgical techniques at your appointment. Generally, recovery time is faster and there are fewer complications after laparoscopic procedures than after open incisional procedures. LET [facility] CARE PROVIDER KNOW ABOUT:   Any allergies you have.  All medicines you are taking, including vitamins, herbs, eye drops, creams, and over-the-counter medicines.  Previous problems you or members of your family have had with the use of anesthetics.  Any blood disorders you have.  Previous surgeries you have had.  Medical conditions you have. RISKS AND COMPLICATIONS Generally, this is a safe procedure. However, as with any procedure, complications can occur. Possible complications include:  Allergies to medicines.  Difficulty breathing.  Bleeding.  Infection.  Damage to other structures near your uterus and cervix. BEFORE THE PROCEDURE  Ask your health care provider about changing or stopping your regular medicines.  Take certain medicines, such as a colon-emptying preparation, as directed.  Do not eat or drink anything for at least 8 hours before your surgery.  Stop smoking if you smoke. Stopping will improve your health after surgery.  Arrange for a ride home after surgery and for help at home during recovery. PROCEDURE   An IV tube will be put into one of your veins in order to give you fluids and medicines.  You will receive medicines to relax you and medicines that make you sleep (general anesthetic).  You may have  a flexible tube (catheter) put into your bladder to drain urine.  You may have a tube put through your nose or mouth that goes into your  stomach (nasogastric tube). The nasogastric tube removes digestive fluids and prevents you from feeling nauseated and from vomiting.  Tight-fitting (compression) stockings will be placed on your legs to promote circulation.  Three to four small incisions will be made in your abdomen. An incision also will be made in your vagina. Probes and tools will be inserted into the small incisions. The uterus and cervix are removed (and possibly your ovaries and fallopian tubes) through your vagina as well as through the small incisions that were made in the abdomen.  Your vagina is then sewn back to normal. AFTER THE PROCEDURE  You may have a liquid diet temporarily. You will most likely return to, and tolerate, your usual diet the day after surgery.  You will be passing urine through a catheter. It will be removed the day after surgery.  Your temperature, breathing rate, heart rate, blood pressure, and oxygen level will be monitored regularly.  You will still wear compression stockings on your legs until you are able to move around.  You will use a special device or do breathing exercises to keep your lungs clear.  You will be encouraged to walk as soon as possible.   This information is not intended to replace advice given to you by your health care provider. Make sure you discuss any questions you have with your health care provider.   Document Released: 06/02/2011 Document Revised: 07/04/2014 Document Reviewed: 12/27/2012 Elsevier Interactive Patient Education [employer].

## 2015-11-26 ENCOUNTER — Encounter: Payer: Self-pay | Admitting: Obstetrics & Gynecology

## 2015-11-26 LAB — TSH: TSH: 1.59 m[IU]/L

## 2015-11-27 LAB — CYTOLOGY - PAP

## 2015-12-01 ENCOUNTER — Telehealth: Payer: Self-pay

## 2015-12-01 NOTE — Telephone Encounter (Signed)
Biospy and pap were negative patient has been notified to follow up as scheduled.

## 2015-12-10 ENCOUNTER — Encounter: Payer: Self-pay | Admitting: Obstetrics & Gynecology

## 2015-12-10 ENCOUNTER — Ambulatory Visit (INDEPENDENT_AMBULATORY_CARE_PROVIDER_SITE_OTHER): Payer: BLUE CROSS/BLUE SHIELD | Admitting: Obstetrics & Gynecology

## 2015-12-10 VITALS — BP 148/96 | HR 118 | Ht 62.0 in | Wt 265.0 lb

## 2015-12-10 DIAGNOSIS — N939 Abnormal uterine and vaginal bleeding, unspecified: Secondary | ICD-10-CM

## 2015-12-10 DIAGNOSIS — D259 Leiomyoma of uterus, unspecified: Secondary | ICD-10-CM | POA: Diagnosis not present

## 2015-12-10 NOTE — Progress Notes (Signed)
Patient ID: [name], female   DOB: August 20, 1970, 45 y.o.   MRN: [id number] History:  45 y.o. G3P3003 here today for f/u of AUB. Pt has been bleeding daily since April.  She reports that the megace is not working and she is still spotting daily.  She denies pain at present.  She requests definitive treatment.  Last HgbA1C 7.    The following portions of the patient's history were reviewed and updated as appropriate: allergies, current medications, past family history, past medical history, past social history, past surgical history and problem list.  Current Outpatient Prescriptions on File Prior to Visit  Medication Sig Dispense Refill  . glimepiride (AMARYL) 2 MG tablet Take 2 mg by mouth daily with breakfast.    . lisinopril (PRINIVIL,ZESTRIL) 5 MG tablet Take 5 mg by mouth daily.    . megestrol (MEGACE) 40 MG tablet Take 1 tablet (40 mg total) by mouth 2 (two) times daily. 60 tablet 3  . sitaGLIPtin-metformin (JANUMET) 50-1000 MG per tablet Take 1 tablet by mouth 2 (two) times daily with a meal.     No current facility-administered medications on file prior to visit.   Past Surgical History  Procedure Laterality Date  . Knee surgery       Review of Systems:  Pertinent items are noted in HPI.  Objective:  Physical Exam Blood pressure 148/96, pulse 118, height 5\' 2"  (1.575 m), weight 265 lb (120.203 kg), last menstrual period 10/02/2015. Gen: NAD GYN exam deferred  Labs and Imaging CLINICAL DATA: Abnormal uterine bleeding. 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 Morbid obesity. LMP 09/15/2015.  EXAM: TRANSABDOMINAL AND TRANSVAGINAL ULTRASOUND OF PELVIS  TECHNIQUE: Both transabdominal and transvaginal ultrasound examinations of the pelvis were performed. Transabdominal technique was performed for global imaging of the pelvis including uterus, ovaries, adnexal regions, and pelvic cul-de-sac. It was necessary to proceed with endovaginal exam following the transabdominal  exam to visualize the endometrium and ovaries.  COMPARISON: None  FINDINGS: Uterus  Measurements: 16.6 x 9.7 x 10.4 cm. Diffusely heterogeneous myometrial echotexture, however no distinct fibroids identified.  Endometrium  Thickness: 10 mm in fundal region. However, there appears to be more focal thickening of the endometrium in the lower uterine segment measuring approximately 26 mm on sagittal cine clip. Focal endometrial polyp or mass cannot be excluded.  Right ovary  Measurements: 2.6 x 1.2 x 2.0 cm. Normal appearance/no adnexal mass.  Left ovary  Measurements: 2.8 x 1.6 x 2.2 cm. Normal appearance/no adnexal mass.  Other findings  No abnormal free fluid.  IMPRESSION: Enlarged uterus with diffusely heterogeneous myometrium, but no distinct fibroids. Adenomyosis cannot be excluded.  Question focal endometrial thickening or mass in the lower uterine segment. Consider further evaluation with follow-up transvaginal pelvic ultrasound in 6-12 weeks in the week immediately following menses, or pelvic MRI without and with contrast.  11/25/2015 Diagnosis Endometrium, biopsy ENDOMETRIAL POLYP WITH PROGESTIN EFFECT NEGATIVE FOR ATYPIA OR MALIGNANCY0  Assessment & Plan:  AUB Symptomatic uterine fibroids.  D/w pt conservative management but, pt declines long term meds and does not feel that the Megace is working.  She also declines the hysteroscopy with D&C because it would offer her temporary relief at best.  Reviewed surgical options including TAH, and RATH with bilateral salpingectomy. Pts uterus is too large for a TVH.         Patient desires surgical management with [place] with bilateral salpingectomy.  The risks of surgery were discussed in detail with the patient including but not  limited to: bleeding which may require transfusion or reoperation; infection which may require prolonged hospitalization or re-hospitalization and antibiotic therapy; injury to  bowel, bladder, ureters and major vessels or other surrounding organs; need for additional procedures including laparotomy; thromboembolic phenomenon, incisional problems and other postoperative or anesthesia complications.  Patient was told that the likelihood that her condition and symptoms will be treated effectively with this surgical management was very high; the postoperative expectations were also discussed in detail. The patient also understands the alternative treatment options which were discussed in full. All questions were answered.  She was told that she will be contacted by our surgical scheduler regarding the time and date of her surgery; routine preoperative instructions of having nothing to eat or drink after midnight on the day prior to surgery and also coming to the hospital 1 1/2 hours prior to her time of surgery were also emphasized.  She was told she may be called for a preoperative appointment about a week prior to surgery and will be given further preoperative instructions at that visit. Printed patient education handouts about the procedure were given to the patient to review at home.  20 min spent in face to face discussion with pt  Ziva Nunziata L. Harraway-Smith, M.D., [facility]

## 2015-12-10 NOTE — Patient Instructions (Signed)
Total Laparoscopic Hysterectomy A total laparoscopic hysterectomy is a minimally invasive surgery to remove your uterus and cervix. This surgery is performed by making several small cuts (incisions) in your abdomen. It can also be done with a thin, lighted tube (laparoscope) inserted into two small incisions in your lower abdomen. Your fallopian tubes and ovaries can be removed (bilateral salpingo-oophorectomy) during this surgery as well.Benefits of minimally invasive surgery include:  Less pain.  Less risk of blood loss.  Less risk of infection.  Quicker return to normal activities. LET YOUR HEALTH CARE PROVIDER KNOW ABOUT:  Any allergies you have.  All medicines you are taking, including vitamins, herbs, eye drops, creams, and over-the-counter medicines.  Previous problems you or members of your family have had with the use of anesthetics.  Any blood disorders you have.  Previous surgeries you have had.  Medical conditions you have. RISKS AND COMPLICATIONS  Generally, this is a safe procedure. However, as with any procedure, complications can occur. Possible complications include:  Bleeding.  Blood clots in the legs or lung.  Infection.  Injury to surrounding organs.  Problems with anesthesia.  Early menopause symptoms (hot flashes, night sweats, insomnia).  Risk of conversion to an open abdominal incision. BEFORE THE PROCEDURE  Ask your health care provider about changing or stopping your regular medicines.  Do not take aspirin or blood thinners (anticoagulants) for 1 week before the surgery or as told by your health care provider.  Do not eat or drink anything for 8 hours before the surgery or as told by your health care provider.  Quit smoking if you smoke.  Arrange for a ride home after surgery and for someone to help you at home during recovery. PROCEDURE   You will be given antibiotic medicine.  An IV tube will be placed in your arm. You will be given  medicine to make you sleep (general anesthetic).  A gas (carbon dioxide) will be used to inflate your abdomen. This will allow your surgeon to look inside your abdomen, perform your surgery, and treat any other problems found if necessary.  Three or four small incisions (often less than 1/2 inch) will be made in your abdomen. One of these incisions will be made in the area of your belly button (navel). The laparoscope will be inserted into the incision. Your surgeon will look through the laparoscope while doing your procedure.  Other surgical instruments will be inserted through the other incisions.  Your uterus may be removed through your vagina or cut into small pieces and removed through the small incisions.  Your incisions will be closed. AFTER THE PROCEDURE  The gas will be released from inside your abdomen.  You will be taken to the recovery area where a nurse will watch and check your progress. Once you are awake, stable, and taking fluids well, without other problems, you will return to your room or be allowed to go home.  There is usually minimal discomfort following the surgery because the incisions are so small.  You will be given pain medicine while you are in the hospital and for when you go home.   This information is not intended to replace advice given to you by your health care provider. Make sure you discuss any questions you have with your health care provider.   Document Released: 04/10/2007 Document Revised: 02/13/2013 Document Reviewed: 01/01/2013 Elsevier Interactive Patient Education 2016 [employer]  

## 2015-12-14 ENCOUNTER — Encounter (HOSPITAL_COMMUNITY): Payer: Self-pay | Admitting: *Deleted

## 2015-12-19 ENCOUNTER — Encounter: Payer: Self-pay | Admitting: *Deleted

## 2015-12-19 NOTE — Progress Notes (Signed)
FMLA papers completed and faxed to [facility].  Will have someone contact patient to pick up her copy.

## 2015-12-21 ENCOUNTER — Other Ambulatory Visit: Payer: Self-pay

## 2015-12-21 ENCOUNTER — Encounter (HOSPITAL_COMMUNITY): Payer: Self-pay

## 2015-12-21 ENCOUNTER — Encounter (HOSPITAL_COMMUNITY)
Admission: RE | Admit: 2015-12-21 | Discharge: 2015-12-21 | Disposition: A | Discharge status: Home / Self Care | Payer: BLUE CROSS/BLUE SHIELD | Source: Ambulatory Visit | Attending: Obstetrics & Gynecology | Admitting: Obstetrics & Gynecology

## 2015-12-21 DIAGNOSIS — D259 Leiomyoma of uterus, unspecified: Secondary | ICD-10-CM | POA: Diagnosis not present

## 2015-12-21 DIAGNOSIS — N800 Endometriosis of uterus: Secondary | ICD-10-CM | POA: Diagnosis not present

## 2015-12-21 DIAGNOSIS — N939 Abnormal uterine and vaginal bleeding, unspecified: Secondary | ICD-10-CM | POA: Diagnosis not present

## 2015-12-21 DIAGNOSIS — E119 Type 2 diabetes mellitus without complications: Secondary | ICD-10-CM | POA: Diagnosis not present

## 2015-12-21 DIAGNOSIS — N938 Other specified abnormal uterine and vaginal bleeding: Secondary | ICD-10-CM | POA: Diagnosis not present

## 2015-12-21 DIAGNOSIS — Z6841 Body Mass Index (BMI) 40.0 and over, adult: Secondary | ICD-10-CM | POA: Diagnosis not present

## 2015-12-21 DIAGNOSIS — E669 Obesity, unspecified: Secondary | ICD-10-CM | POA: Diagnosis not present

## 2015-12-21 DIAGNOSIS — I10 Essential (primary) hypertension: Secondary | ICD-10-CM | POA: Diagnosis not present

## 2015-12-21 DIAGNOSIS — Z7984 Long term (current) use of oral hypoglycemic drugs: Secondary | ICD-10-CM | POA: Diagnosis not present

## 2015-12-21 HISTORY — DX: Anemia, unspecified: D64.9

## 2015-12-21 LAB — BASIC METABOLIC PANEL
ANION GAP: 11 (ref 5–15)
BUN: 10 mg/dL (ref 6–20)
CHLORIDE: 103 mmol/L (ref 101–111)
CO2: 24 mmol/L (ref 22–32)
Calcium: 9.3 mg/dL (ref 8.9–10.3)
Creatinine, Ser: 0.63 mg/dL (ref 0.44–1.00)
GFR calc non Af Amer: >60 mL/min (ref >60)
Glucose, Bld: 171 mg/dL — ABNORMAL HIGH (ref 65–99)
POTASSIUM: 3.4 mmol/L — AB (ref 3.5–5.1)
SODIUM: 138 mmol/L (ref 135–145)

## 2015-12-21 LAB — CBC
HCT: 35.1 % — ABNORMAL LOW (ref 36.0–46.0)
HEMOGLOBIN: 11.6 g/dL — AB (ref 12.0–15.0)
MCH: 28 pg (ref 26.0–34.0)
MCHC: 33 g/dL (ref 30.0–36.0)
MCV: 84.8 fL (ref 78.0–100.0)
Platelets: 283 10*3/uL (ref 150–400)
RBC: 4.14 MIL/uL (ref 3.87–5.11)
RDW: 13.5 % (ref 11.5–15.5)
WBC: 10 10*3/uL (ref 4.0–10.5)

## 2015-12-21 NOTE — Pre-Procedure Instructions (Addendum)
Per patient last hgb A1C was 7.7 done last month

## 2015-12-21 NOTE — Patient Instructions (Signed)
Your procedure is scheduled on:12/22/15  Enter through the Main Entrance at :87 am Pick up desk phone and dial [phone] and inform us of your arrival.  Please call [phone] if you have any problems the morning of surgery.  Remember: Do not eat food after midnight:tonight Clear liquids are ok until: 9am on Tuesday   You may brush your teeth the morning of surgery.  Take these meds the morning of surgery with a sip of water: Lisinopril  DO NOT wear jewelry, eye make-up, lipstick,body lotion, or dark fingernail polish.  (Polished toes are ok) You may wear deodorant.  If you are to be admitted after surgery, leave suitcase in car until your room has been assigned. Patients discharged on the day of surgery will not be allowed to drive home. Wear loose fitting, comfortable clothes for your ride home.

## 2015-12-22 ENCOUNTER — Observation Stay (HOSPITAL_COMMUNITY)
Admission: RE | Admit: 2015-12-22 | Discharge: 2015-12-23 | Disposition: A | Discharge status: Home / Self Care | Payer: BLUE CROSS/BLUE SHIELD | Source: Ambulatory Visit | Attending: Obstetrics & Gynecology | Admitting: Obstetrics & Gynecology

## 2015-12-22 ENCOUNTER — Ambulatory Visit (HOSPITAL_COMMUNITY): Payer: BLUE CROSS/BLUE SHIELD | Admitting: Anesthesiology

## 2015-12-22 ENCOUNTER — Encounter (HOSPITAL_COMMUNITY): Payer: Self-pay | Admitting: Anesthesiology

## 2015-12-22 ENCOUNTER — Encounter (HOSPITAL_COMMUNITY)
Admission: RE | Disposition: A | Discharge status: Home / Self Care | Payer: Self-pay | Source: Ambulatory Visit | Attending: Obstetrics & Gynecology

## 2015-12-22 DIAGNOSIS — D259 Leiomyoma of uterus, unspecified: Secondary | ICD-10-CM | POA: Diagnosis not present

## 2015-12-22 DIAGNOSIS — Z7984 Long term (current) use of oral hypoglycemic drugs: Secondary | ICD-10-CM | POA: Insufficient documentation

## 2015-12-22 DIAGNOSIS — D219 Benign neoplasm of connective and other soft tissue, unspecified: Secondary | ICD-10-CM

## 2015-12-22 DIAGNOSIS — E669 Obesity, unspecified: Secondary | ICD-10-CM | POA: Diagnosis not present

## 2015-12-22 DIAGNOSIS — N938 Other specified abnormal uterine and vaginal bleeding: Secondary | ICD-10-CM | POA: Diagnosis not present

## 2015-12-22 DIAGNOSIS — N800 Endometriosis of uterus: Secondary | ICD-10-CM | POA: Insufficient documentation

## 2015-12-22 DIAGNOSIS — I10 Essential (primary) hypertension: Secondary | ICD-10-CM | POA: Insufficient documentation

## 2015-12-22 DIAGNOSIS — E119 Type 2 diabetes mellitus without complications: Secondary | ICD-10-CM

## 2015-12-22 DIAGNOSIS — N939 Abnormal uterine and vaginal bleeding, unspecified: Secondary | ICD-10-CM | POA: Insufficient documentation

## 2015-12-22 DIAGNOSIS — Z9889 Other specified postprocedural states: Secondary | ICD-10-CM

## 2015-12-22 DIAGNOSIS — Z6841 Body Mass Index (BMI) 40.0 and over, adult: Secondary | ICD-10-CM | POA: Insufficient documentation

## 2015-12-22 HISTORY — PX: ROBOTIC ASSISTED TOTAL HYSTERECTOMY WITH SALPINGECTOMY: SHX6679

## 2015-12-22 LAB — GLUCOSE, CAPILLARY
GLUCOSE-CAPILLARY: 178 mg/dL — AB (ref 65–99)
Glucose-Capillary: 174 mg/dL — ABNORMAL HIGH (ref 65–99)
Glucose-Capillary: 278 mg/dL — ABNORMAL HIGH (ref 65–99)

## 2015-12-22 LAB — TYPE AND SCREEN
ABO/RH(D): O POS
Antibody Screen: NEGATIVE

## 2015-12-22 LAB — ABO/RH: ABO/RH(D): O POS

## 2015-12-22 LAB — PREGNANCY, URINE: PREG TEST UR: NEGATIVE

## 2015-12-22 SURGERY — ROBOTIC ASSISTED TOTAL HYSTERECTOMY WITH SALPINGECTOMY
Anesthesia: General | Site: Abdomen | Laterality: Bilateral

## 2015-12-22 MED ORDER — PNEUMOCOCCAL VAC POLYVALENT 25 MCG/0.5ML IJ INJ
0.5000 mL | INJECTION | INTRAMUSCULAR | Status: DC
  Filled 2015-12-22: qty 0.5

## 2015-12-22 MED ORDER — HYDROMORPHONE HCL 1 MG/ML IJ SOLN
INTRAMUSCULAR | Status: AC
  Filled 2015-12-22: qty 1

## 2015-12-22 MED ORDER — METHYLENE BLUE 0.5 % INJ SOLN
INTRAVENOUS | Status: DC | PRN
  Administered 2015-12-22: 60 mg via INTRAVENOUS
  Administered 2015-12-22: 40 mg via INTRAVENOUS

## 2015-12-22 MED ORDER — HYDROMORPHONE HCL 1 MG/ML IJ SOLN
0.2500 mg | INTRAMUSCULAR | Status: DC | PRN
  Administered 2015-12-22 (×3): 0.5 mg via INTRAVENOUS

## 2015-12-22 MED ORDER — PHENYLEPHRINE HCL 10 MG/ML IJ SOLN
INTRAMUSCULAR | Status: DC | PRN
  Administered 2015-12-22: 120 ug via INTRAVENOUS
  Administered 2015-12-22: 80 ug via INTRAVENOUS

## 2015-12-22 MED ORDER — PHENYLEPHRINE 40 MCG/ML (10ML) SYRINGE FOR IV PUSH (FOR BLOOD PRESSURE SUPPORT)
PREFILLED_SYRINGE | INTRAVENOUS | Status: AC
  Filled 2015-12-22: qty 30

## 2015-12-22 MED ORDER — POTASSIUM CHLORIDE IN NACL 40-0.9 MEQ/L-% IV SOLN
INTRAVENOUS | Status: DC
  Administered 2015-12-22: 125 mL/h via INTRAVENOUS

## 2015-12-22 MED ORDER — POLYETHYLENE GLYCOL 3350 17 G PO PACK: 17.0000 g | PACK | Freq: Every day | ORAL | Status: DC | PRN

## 2015-12-22 MED ORDER — INSULIN ASPART 100 UNIT/ML ~~LOC~~ SOLN
12.0000 [IU] | Freq: Once | SUBCUTANEOUS | Status: AC
  Administered 2015-12-22: 12 [IU] via SUBCUTANEOUS

## 2015-12-22 MED ORDER — METHYLENE BLUE 1 % INJ SOLN
INTRAMUSCULAR | Status: AC
  Filled 2015-12-22: qty 10

## 2015-12-22 MED ORDER — DOCUSATE SODIUM 100 MG PO CAPS
100.0000 mg | ORAL_CAPSULE | Freq: Two times a day (BID) | ORAL | Status: DC
  Administered 2015-12-23: 100 mg via ORAL
  Filled 2015-12-22 (×2): qty 1

## 2015-12-22 MED ORDER — CEFAZOLIN SODIUM-DEXTROSE 2-4 GM/100ML-% IV SOLN: 2.0000 g | Freq: Once | INTRAVENOUS | Status: DC

## 2015-12-22 MED ORDER — IBUPROFEN 600 MG PO TABS
600.0000 mg | ORAL_TABLET | Freq: Four times a day (QID) | ORAL | Status: DC | PRN
  Administered 2015-12-23: 600 mg via ORAL
  Filled 2015-12-22: qty 1

## 2015-12-22 MED ORDER — ARTIFICIAL TEARS OP OINT
TOPICAL_OINTMENT | OPHTHALMIC | Status: AC
  Filled 2015-12-22: qty 3.5

## 2015-12-22 MED ORDER — KETOROLAC TROMETHAMINE 30 MG/ML IJ SOLN: 30.0000 mg | Freq: Once | INTRAMUSCULAR | Status: DC

## 2015-12-22 MED ORDER — INSULIN ASPART 100 UNIT/ML ~~LOC~~ SOLN
0.0000 [IU] | Freq: Three times a day (TID) | SUBCUTANEOUS | Status: DC
  Administered 2015-12-23: 3 [IU] via SUBCUTANEOUS
  Administered 2015-12-23: 4 [IU] via SUBCUTANEOUS

## 2015-12-22 MED ORDER — METFORMIN HCL 500 MG PO TABS
1000.0000 mg | ORAL_TABLET | Freq: Two times a day (BID) | ORAL | Status: DC
  Administered 2015-12-23: 1000 mg via ORAL
  Filled 2015-12-22 (×3): qty 2

## 2015-12-22 MED ORDER — LIDOCAINE HCL (CARDIAC) 20 MG/ML IV SOLN
INTRAVENOUS | Status: AC
  Filled 2015-12-22: qty 5

## 2015-12-22 MED ORDER — KETOROLAC TROMETHAMINE 30 MG/ML IJ SOLN: 30.0000 mg | Freq: Four times a day (QID) | INTRAMUSCULAR | Status: DC

## 2015-12-22 MED ORDER — TRAMADOL HCL 50 MG PO TABS
50.0000 mg | ORAL_TABLET | Freq: Four times a day (QID) | ORAL | Status: DC | PRN
  Administered 2015-12-23: 50 mg via ORAL
  Filled 2015-12-22: qty 1

## 2015-12-22 MED ORDER — LACTATED RINGERS IV SOLN
INTRAVENOUS | Status: DC
  Administered 2015-12-22 (×2): via INTRAVENOUS

## 2015-12-22 MED ORDER — MIDAZOLAM HCL 2 MG/2ML IJ SOLN
INTRAMUSCULAR | Status: AC
  Filled 2015-12-22: qty 2

## 2015-12-22 MED ORDER — ROCURONIUM BROMIDE 100 MG/10ML IV SOLN
INTRAVENOUS | Status: DC | PRN
  Administered 2015-12-22: 10 mg via INTRAVENOUS
  Administered 2015-12-22: 20 mg via INTRAVENOUS
  Administered 2015-12-22: 50 mg via INTRAVENOUS
  Administered 2015-12-22: 20 mg via INTRAVENOUS

## 2015-12-22 MED ORDER — ENOXAPARIN SODIUM 40 MG/0.4ML ~~LOC~~ SOLN
40.0000 mg | SUBCUTANEOUS | Status: DC
  Administered 2015-12-23: 40 mg via SUBCUTANEOUS
  Filled 2015-12-22 (×2): qty 0.4

## 2015-12-22 MED ORDER — BUPIVACAINE HCL (PF) 0.5 % IJ SOLN
INTRAMUSCULAR | Status: DC | PRN
  Administered 2015-12-22: 30 mL

## 2015-12-22 MED ORDER — BISACODYL 10 MG RE SUPP
10.0000 mg | Freq: Every day | RECTAL | Status: DC | PRN
Start: 2015-12-22 — End: 2015-12-23

## 2015-12-22 MED ORDER — SCOPOLAMINE 1 MG/3DAYS TD PT72
1.0000 | MEDICATED_PATCH | Freq: Once | TRANSDERMAL | Status: DC
  Administered 2015-12-22: 1.5 mg via TRANSDERMAL

## 2015-12-22 MED ORDER — SODIUM CHLORIDE 0.9 % IV SOLN
INTRAVENOUS | Status: AC
  Administered 2015-12-22 (×2): via INTRAVENOUS
  Filled 2015-12-22: qty 1000

## 2015-12-22 MED ORDER — INSULIN ASPART 100 UNIT/ML ~~LOC~~ SOLN
SUBCUTANEOUS | Status: AC
  Filled 2015-12-22: qty 1

## 2015-12-22 MED ORDER — ROCURONIUM BROMIDE 100 MG/10ML IV SOLN
INTRAVENOUS | Status: AC
  Filled 2015-12-22: qty 1

## 2015-12-22 MED ORDER — LIDOCAINE HCL (CARDIAC) 20 MG/ML IV SOLN
INTRAVENOUS | Status: DC | PRN
  Administered 2015-12-22: 70 mg via INTRAVENOUS
  Administered 2015-12-22: 30 mg via INTRAVENOUS

## 2015-12-22 MED ORDER — DEXAMETHASONE SODIUM PHOSPHATE 4 MG/ML IJ SOLN
INTRAMUSCULAR | Status: AC
  Filled 2015-12-22: qty 1

## 2015-12-22 MED ORDER — HYDROMORPHONE HCL 1 MG/ML IJ SOLN
0.2000 mg | INTRAMUSCULAR | Status: DC | PRN
  Administered 2015-12-22: 0.6 mg via INTRAVENOUS
  Administered 2015-12-23: 0.4 mg via INTRAVENOUS
  Filled 2015-12-22 (×2): qty 1

## 2015-12-22 MED ORDER — GLIMEPIRIDE 2 MG PO TABS
2.0000 mg | ORAL_TABLET | Freq: Every day | ORAL | Status: DC
  Administered 2015-12-23: 2 mg via ORAL
  Filled 2015-12-22 (×2): qty 1

## 2015-12-22 MED ORDER — FENTANYL CITRATE (PF) 100 MCG/2ML IJ SOLN
INTRAMUSCULAR | Status: AC
  Filled 2015-12-22: qty 2

## 2015-12-22 MED ORDER — SUGAMMADEX SODIUM 200 MG/2ML IV SOLN
INTRAVENOUS | Status: DC | PRN
  Administered 2015-12-22: 234 mg via INTRAVENOUS

## 2015-12-22 MED ORDER — MIDAZOLAM HCL 2 MG/2ML IJ SOLN
INTRAMUSCULAR | Status: DC | PRN
  Administered 2015-12-22: 1 mg via INTRAVENOUS

## 2015-12-22 MED ORDER — SCOPOLAMINE 1 MG/3DAYS TD PT72
MEDICATED_PATCH | TRANSDERMAL | Status: AC
  Administered 2015-12-22: 1.5 mg via TRANSDERMAL
  Filled 2015-12-22: qty 1

## 2015-12-22 MED ORDER — LINAGLIPTIN 5 MG PO TABS
5.0000 mg | ORAL_TABLET | Freq: Every day | ORAL | Status: DC
  Administered 2015-12-23: 5 mg via ORAL
  Filled 2015-12-22 (×2): qty 1

## 2015-12-22 MED ORDER — DEXAMETHASONE SODIUM PHOSPHATE 10 MG/ML IJ SOLN
INTRAMUSCULAR | Status: DC | PRN
  Administered 2015-12-22: 4 mg via INTRAVENOUS

## 2015-12-22 MED ORDER — ONDANSETRON HCL 4 MG PO TABS: 4.0000 mg | ORAL_TABLET | Freq: Four times a day (QID) | ORAL | Status: DC | PRN

## 2015-12-22 MED ORDER — ESMOLOL HCL 100 MG/10ML IV SOLN
INTRAVENOUS | Status: DC | PRN
  Administered 2015-12-22 (×3): 10 mg via INTRAVENOUS

## 2015-12-22 MED ORDER — INSULIN ASPART 100 UNIT/ML ~~LOC~~ SOLN: 0.0000 [IU] | Freq: Every day | SUBCUTANEOUS | Status: DC

## 2015-12-22 MED ORDER — LISINOPRIL 5 MG PO TABS
5.0000 mg | ORAL_TABLET | Freq: Every day | ORAL | Status: DC
  Administered 2015-12-23: 5 mg via ORAL
  Filled 2015-12-22 (×2): qty 1

## 2015-12-22 MED ORDER — KETOROLAC TROMETHAMINE 30 MG/ML IJ SOLN
30.0000 mg | Freq: Four times a day (QID) | INTRAMUSCULAR | Status: DC
  Administered 2015-12-22 – 2015-12-23 (×2): 30 mg via INTRAVENOUS
  Filled 2015-12-22 (×2): qty 1

## 2015-12-22 MED ORDER — LACTATED RINGERS IR SOLN
Status: DC | PRN
  Administered 2015-12-22: 3000 mL

## 2015-12-22 MED ORDER — LACTATED RINGERS IV SOLN
INTRAVENOUS | Status: DC
  Administered 2015-12-22: 12:00:00 via INTRAVENOUS

## 2015-12-22 MED ORDER — SITAGLIPTIN PHOS-METFORMIN HCL 50-1000 MG PO TABS: 1.0000 | ORAL_TABLET | Freq: Two times a day (BID) | ORAL | Status: DC

## 2015-12-22 MED ORDER — PROPOFOL 10 MG/ML IV BOLUS
INTRAVENOUS | Status: AC
  Filled 2015-12-22: qty 20

## 2015-12-22 MED ORDER — ONDANSETRON HCL 4 MG/2ML IJ SOLN: 4.0000 mg | Freq: Four times a day (QID) | INTRAMUSCULAR | Status: DC | PRN

## 2015-12-22 MED ORDER — CEFAZOLIN SODIUM 10 G IJ SOLR
3.0000 g | INTRAMUSCULAR | Status: DC
  Administered 2015-12-22 (×2): 3 g via INTRAVENOUS
  Filled 2015-12-22: qty 3000

## 2015-12-22 MED ORDER — ESMOLOL HCL 100 MG/10ML IV SOLN
INTRAVENOUS | Status: AC
  Filled 2015-12-22: qty 10

## 2015-12-22 MED ORDER — PROPOFOL 10 MG/ML IV BOLUS
INTRAVENOUS | Status: DC | PRN
  Administered 2015-12-22: 180 mg via INTRAVENOUS

## 2015-12-22 MED ORDER — PHENYLEPHRINE 40 MCG/ML (10ML) SYRINGE FOR IV PUSH (FOR BLOOD PRESSURE SUPPORT)
PREFILLED_SYRINGE | INTRAVENOUS | Status: AC
  Filled 2015-12-22: qty 10

## 2015-12-22 MED ORDER — KETOROLAC TROMETHAMINE 30 MG/ML IJ SOLN
INTRAMUSCULAR | Status: AC
  Filled 2015-12-22: qty 1

## 2015-12-22 MED ORDER — SIMETHICONE 80 MG PO CHEW: 80.0000 mg | CHEWABLE_TABLET | Freq: Four times a day (QID) | ORAL | Status: DC | PRN

## 2015-12-22 MED ORDER — BUPIVACAINE HCL (PF) 0.5 % IJ SOLN
INTRAMUSCULAR | Status: AC
  Filled 2015-12-22: qty 30

## 2015-12-22 MED ORDER — LACTATED RINGERS IV SOLN: INTRAVENOUS | Status: DC

## 2015-12-22 MED ORDER — FENTANYL CITRATE (PF) 100 MCG/2ML IJ SOLN
INTRAMUSCULAR | Status: DC | PRN
  Administered 2015-12-22: 75 ug via INTRAVENOUS
  Administered 2015-12-22 (×2): 50 ug via INTRAVENOUS
  Administered 2015-12-22: 100 ug via INTRAVENOUS
  Administered 2015-12-22: 50 ug via INTRAVENOUS
  Administered 2015-12-22: 25 ug via INTRAVENOUS
  Administered 2015-12-22: 50 ug via INTRAVENOUS

## 2015-12-22 MED ORDER — FENTANYL CITRATE (PF) 250 MCG/5ML IJ SOLN
INTRAMUSCULAR | Status: AC
  Filled 2015-12-22: qty 5

## 2015-12-22 MED ORDER — PANTOPRAZOLE SODIUM 40 MG PO TBEC
40.0000 mg | DELAYED_RELEASE_TABLET | Freq: Every day | ORAL | Status: DC
  Administered 2015-12-23: 40 mg via ORAL
  Filled 2015-12-22: qty 1

## 2015-12-22 MED ORDER — ONDANSETRON HCL 4 MG/2ML IJ SOLN
INTRAMUSCULAR | Status: AC
  Filled 2015-12-22: qty 2

## 2015-12-22 MED ORDER — STERILE WATER FOR IRRIGATION IR SOLN
Status: DC | PRN
  Administered 2015-12-22: 1000 mL

## 2015-12-22 SURGICAL SUPPLY — 70 items
APL SKNCLS STERI-STRIP NONHPOA (GAUZE/BANDAGES/DRESSINGS)
APL SRG 38 LTWT LNG FL B (MISCELLANEOUS)
APPLICATOR ARISTA FLEXITIP XL (MISCELLANEOUS) IMPLANT
APPLIER CLIP 5 13 M/L LIGAMAX5 (MISCELLANEOUS)
APR CLP MED LRG 5 ANG JAW (MISCELLANEOUS)
BARRIER ADHS 3X4 INTERCEED (GAUZE/BANDAGES/DRESSINGS) IMPLANT
BENZOIN TINCTURE PRP APPL 2/3 (GAUZE/BANDAGES/DRESSINGS) ×1 IMPLANT
BRR ADH 4X3 ABS CNTRL BYND (GAUZE/BANDAGES/DRESSINGS)
CATH FOLEY 3WAY  5CC 16FR (CATHETERS)
CATH FOLEY 3WAY 5CC 16FR (CATHETERS) ×1 IMPLANT
CLIP APPLIE 5 13 M/L LIGAMAX5 (MISCELLANEOUS) IMPLANT
CLOTH BEACON ORANGE TIMEOUT ST (SAFETY) ×2 IMPLANT
CONT PATH 16OZ SNAP LID 3702 (MISCELLANEOUS) ×2 IMPLANT
COVER BACK TABLE 60X90IN (DRAPES) ×4 IMPLANT
COVER TIP SHEARS 8 DVNC (MISCELLANEOUS) ×1 IMPLANT
COVER TIP SHEARS 8MM DA VINCI (MISCELLANEOUS) ×1
DECANTER SPIKE VIAL GLASS SM (MISCELLANEOUS) ×2 IMPLANT
DEVICE TROCAR PUNCTURE CLOSURE (ENDOMECHANICALS) IMPLANT
DRAPE ROBOTICS STRL (DRAPES) ×1 IMPLANT
DURAPREP 26ML APPLICATOR (WOUND CARE) ×2 IMPLANT
ELECT REM PT RETURN 9FT ADLT (ELECTROSURGICAL) ×2
ELECTRODE REM PT RTRN 9FT ADLT (ELECTROSURGICAL) ×1 IMPLANT
GAUZE SPONGE 4X4 16PLY XRAY LF (GAUZE/BANDAGES/DRESSINGS) ×1 IMPLANT
GAUZE VASELINE 3X9 (GAUZE/BANDAGES/DRESSINGS) IMPLANT
GLOVE BIO SURGEON STRL SZ7 (GLOVE) ×5 IMPLANT
GLOVE BIOGEL PI IND STRL 7.0 (GLOVE) ×5 IMPLANT
GLOVE BIOGEL PI INDICATOR 7.0 (GLOVE) ×5
GOWN STRL REUS W/TWL XL LVL3 (GOWN DISPOSABLE) ×2 IMPLANT
GYRUS RUMI II 2.5CM BLUE (DISPOSABLE)
GYRUS RUMI II 3.5CM BLUE (DISPOSABLE)
GYRUS RUMI II 4.0CM BLUE (DISPOSABLE)
KIT ACCESSORY DA VINCI DISP (KITS) ×1
KIT ACCESSORY DVNC DISP (KITS) ×1 IMPLANT
LEGGING LITHOTOMY PAIR STRL (DRAPES) ×2 IMPLANT
LIQUID BAND (GAUZE/BANDAGES/DRESSINGS) ×2 IMPLANT
NEEDLE HYPO 22GX1.5 SAFETY (NEEDLE) ×2 IMPLANT
NEEDLE INSUFFLATION 120MM (ENDOMECHANICALS) ×2 IMPLANT
OCCLUDER COLPOPNEUMO (BALLOONS) ×2 IMPLANT
PACK ROBOT WH (CUSTOM PROCEDURE TRAY) ×2 IMPLANT
PACK ROBOTIC GOWN (GOWN DISPOSABLE) ×2 IMPLANT
PAD PREP 24X48 CUFFED NSTRL (MISCELLANEOUS) ×4 IMPLANT
PAD TRENDELENBURG POSITION (MISCELLANEOUS) ×2 IMPLANT
RUMI II 3.0CM BLUE KOH-EFFICIE (DISPOSABLE) IMPLANT
RUMI II GYRUS 2.5CM BLUE (DISPOSABLE) IMPLANT
RUMI II GYRUS 3.5CM BLUE (DISPOSABLE) IMPLANT
RUMI II GYRUS 4.0CM BLUE (DISPOSABLE) IMPLANT
SEALER ENDOWRIST ONE VESSEL (MISCELLANEOUS) IMPLANT
SET CYSTO W/LG BORE CLAMP LF (SET/KITS/TRAYS/PACK) ×1 IMPLANT
SET IRRIG TUBING LAPAROSCOPIC (IRRIGATION / IRRIGATOR) ×2 IMPLANT
SET TRI-LUMEN FLTR TB AIRSEAL (TUBING) IMPLANT
SPONGE LAP 4X18 X RAY DECT (DISPOSABLE) ×1 IMPLANT
STRIP CLOSURE SKIN 1/2X4 (GAUZE/BANDAGES/DRESSINGS) ×1 IMPLANT
SUT VIC AB 0 CT1 27 (SUTURE) ×4
SUT VIC AB 0 CT1 27XBRD ANBCTR (SUTURE) ×2 IMPLANT
SUT VIC AB 0 CT1 36 (SUTURE) ×1 IMPLANT
SUT VICRYL 0 UR6 27IN ABS (SUTURE) ×4 IMPLANT
SUT VICRYL 4-0 PS2 18IN ABS (SUTURE) ×4 IMPLANT
SUT VLOC 180 0 9IN  GS21 (SUTURE) ×1
SUT VLOC 180 0 9IN GS21 (SUTURE) ×1 IMPLANT
TIP RUMI ORANGE 6.7MMX12CM (TIP) IMPLANT
TIP UTERINE 5.1X6CM LAV DISP (MISCELLANEOUS) IMPLANT
TIP UTERINE 6.7X10CM GRN DISP (MISCELLANEOUS) ×1 IMPLANT
TIP UTERINE 6.7X6CM WHT DISP (MISCELLANEOUS) IMPLANT
TIP UTERINE 6.7X8CM BLUE DISP (MISCELLANEOUS) IMPLANT
TOWEL OR 17X24 6PK STRL BLUE (TOWEL DISPOSABLE) ×5 IMPLANT
TROCAR DILATING TIP 12MM 150MM (ENDOMECHANICALS) ×2 IMPLANT
TROCAR DISP BLADELESS 8 DVNC (TROCAR) ×1 IMPLANT
TROCAR DISP BLADELESS 8MM (TROCAR) ×1
TROCAR PORT AIRSEAL 5X120 (TROCAR) IMPLANT
WATER STERILE IRR 1000ML POUR (IV SOLUTION) ×2 IMPLANT

## 2015-12-22 NOTE — OR Nursing (Signed)
Arms and legs repositioned into correct position at 1500 with approva-e froml anesthesia and surgeon.

## 2015-12-22 NOTE — OR Nursing (Signed)
Left arm repositioned at 1450. Right arm repositioned at 1504. Patient moved down and repositioned on the OR  bed along with repositioning legs and arms again at this time. See anesthesia notes as well. Knees had been checked per Therese Sarah RN and S Rochette RN to confirm they were both bent and in proper position several times during surgical procedure.

## 2015-12-22 NOTE — H&P (Signed)
Preoperative History and Physical  [name] is a 45 y.o. [phone] here for surgical management of AUB and uterine fibroids  Proposed surgery: Robot assisted total laparoscopic hysterectomy with bilateral salpingectomy   Past Medical History  Diagnosis Date  . Hypertension   . Infection     UTI  . Vaginal Pap smear, abnormal   . Fibroid   . Diabetes mellitus     Type 2  . Anemia   . Vaginal delivery [phone]   Past Surgical History  Procedure Laterality Date  . Knee surgery      x 3   OB History    Gravida Para Term Preterm AB TAB SAB Ectopic Multiple Living   3 3 3       3      Patient denies any cervical dysplasia or STIs. Prescriptions prior to admission  Medication Sig Dispense Refill Last Dose  . glimepiride (AMARYL) 2 MG tablet Take 2 mg by mouth daily with breakfast.   12/21/2015 at Unknown time  . lisinopril (PRINIVIL,ZESTRIL) 5 MG tablet Take 5 mg by mouth daily.   12/22/2015 at Unknown time  . phentermine (ADIPEX-P) 37.5 MG tablet Take 37.5 mg by mouth daily before breakfast.   12/20/2015  . sitaGLIPtin-metformin (JANUMET) 50-1000 MG per tablet Take 1 tablet by mouth 2 (two) times daily with a meal.   12/21/2015 at Unknown time  . megestrol (MEGACE) 40 MG tablet Take 1 tablet (40 mg total) by mouth 2 (two) times daily. (Patient not taking: Reported on 12/16/2015) 60 tablet 3 Taking    Allergies  Allergen Reactions  . Bactrim [Sulfamethoxazole-Trimethoprim] Hives  . Percocet [Oxycodone-Acetaminophen] Itching   Social History:   reports that she has never smoked. She has never used smokeless tobacco. She reports that she does not drink alcohol or use illicit drugs. Family History  Problem Relation Age of Onset  . Hypertension Mother   . Diabetes Father   . Hypertension Father   . Hypertension Maternal Grandmother   . Hypertension Maternal Grandfather   . Hypertension Paternal Grandmother   . Hypertension Paternal Grandfather     Review of  Systems: Noncontributory  PHYSICAL EXAM: Blood pressure 146/102, pulse 118, temperature 98.4 F (36.9 C), resp. rate 18, last menstrual period 10/02/2015, SpO2 98 %. General appearance - alert, well appearing, and in no distress Chest - clear to auscultation, no wheezes, rales or rhonchi, symmetric air entry Heart - normal rate and regular rhythm Abdomen - soft, nontender, nondistended, no masses or organomegaly Pelvic - examination not indicated Extremities - peripheral pulses normal, no pedal edema, no clubbing or cyanosis  Labs: Results for orders placed or performed during the hospital encounter of 12/22/15 (from the past 336 hour(s))  Glucose, capillary   Collection Time: 12/22/15 11:49 AM  Result Value Ref Range   Glucose-Capillary 174 (H) 65 - 99 mg/dL   Comment 1 Notify RN    Comment 2 Document in Chart   Results for orders placed or performed during the hospital encounter of 12/21/15 (from the past 336 hour(s))  CBC   Collection Time: 12/21/15  3:33 PM  Result Value Ref Range   WBC 10.0 4.0 - 10.5 K/uL   RBC 4.14 3.87 - 5.11 MIL/uL   Hemoglobin 11.6 (L) 12.0 - 15.0 g/dL   HCT 35.1 (L) 36.0 - 46.0 %   MCV 84.8 78.0 - 100.0 fL   MCH 28.0 26.0 - 34.0 pg   MCHC 33.0 30.0 - 36.0 g/dL   RDW  13.5 11.5 - 15.5 %   Platelets 283 150 - 400 K/uL  Basic metabolic panel   Collection Time: 12/21/15  3:33 PM  Result Value Ref Range   Sodium 138 135 - 145 mmol/L   Potassium 3.4 (L) 3.5 - 5.1 mmol/L   Chloride 103 101 - 111 mmol/L   CO2 24 22 - 32 mmol/L   Glucose, Bld 171 (H) 65 - 99 mg/dL   BUN 10 6 - 20 mg/dL   Creatinine, Ser 0.63 0.44 - 1.00 mg/dL   Calcium 9.3 8.9 - 10.3 mg/dL   GFR calc non Af Amer >60 >60 mL/min   GFR calc Af Amer >60 >60 mL/min   Anion gap 11 5 - 15    Imaging Studies: No results found.  Assessment: Patient Active Problem List   Diagnosis Date Noted  . DUB (dysfunctional uterine bleeding) 10/12/2015  . Obesity 03/01/2012  . Diabetes mellitus  ([place]) 10/26/2011    Plan: Patient will undergo surgical management with Robot assisted total laparoscopic hysterectomy with bilateral salpingectomy .   The risks of surgery were discussed in detail with the patient including but not limited to: bleeding which may require transfusion or reoperation; infection which may require antibiotics; injury to surrounding organs which may involve bowel, bladder, ureters ; need for additional procedures including laparoscopy or laparotomy; thromboembolic phenomenon, surgical site problems and other postoperative/anesthesia complications. Likelihood of success in alleviating the patient's condition was discussed. Routine postoperative instructions will be reviewed with the patient and her family in detail after surgery.  The patient concurred with the proposed plan, giving informed written consent for the surgery.  Patient has been NPO since last night she will remain NPO for procedure.  Anesthesia and OR aware.  Preoperative prophylactic antibiotics and SCDs ordered on call to the OR.  To OR when ready.  Gayla Benn L. Ihor Dow, M.D., [facility] 12/22/2015 12:03 PM

## 2015-12-22 NOTE — Op Note (Addendum)
12/22/2015  4:54 PM  PATIENT:  [name]  45 y.o. female  PRE-OPERATIVE DIAGNOSIS:  FIBROIDS ABNORMAL UTERINE BLEEDING   POST-OPERATIVE DIAGNOSIS:  FIBROIDS ABNORMAL UTERINE BLEEDING   PROCEDURE:  Procedure(s): ROBOTIC ASSISTED TOTAL HYSTERECTOMY WITH BILATERAL SALPINGECTOMY (Bilateral)  SURGEON:  Surgeon(s) and Role:    * Lavonia Drafts, MD - Primary  ANESTHESIA:   local and general  EBL:  Total I/O In: 1800 [I.V.:1800] Out: 475 [Urine:200; Blood:275]  BLOOD ADMINISTERED:none  DRAINS: none   LOCAL MEDICATIONS USED:  MARCAINE     SPECIMEN:  Source of Specimen:  Uterus, cervix and bilateral fallopian tubes   DISPOSITION OF SPECIMEN:  PATHOLOGY  COUNTS:  YES  TOURNIQUET:  * No tourniquets in log *  DICTATION: .Note written in EPIC  PLAN OF CARE: Admit for overnight observation  PATIENT DISPOSITION:  PACU - hemodynamically stable.   Delay start of Pharmacological VTE agent (>24hrs) due to surgical blood loss or risk of bleeding: no  Complications: none immediate  The risks, benefits, and alternatives of surgery were explained, understood, and accepted. Consents were signed. All questions were answered. She was taken to the operating room and general anesthesia was applied without complication. She was placed in the dorsal lithotomy position and her abdomen and vagina were prepped and draped after she had been carefully positioned on the table. A bimanual exam revealed a 16 week size uterus that was mobile. Her adnexa were not enlarged. The cervix was measured and the uterus was sounded to 11 cm. A Rumi uterine manipulator was placed without difficulty. A Foley catheter was placed and it drained clear throughout the case. Gloves were changed and attention was turned to the abdomen. A 68mm incision was made 15 cm above the umbilicus and Optiview 12 mm laparoscope was placed under direct visualization. CO2 was used to insufflate the abdomen. After good  pneumoperitoneum was established the patient was placed in Trendelenburg position and ports were placed in appropriate positions on her abdomen to allow maximum exposure during the robotic case. Specifically there was an 62mm assistant port placed in the left upper quadrant under direct laproscopic visualization. Two 8 mm ports were placed 8cm lateral and inferior to the midline port.  These were all placed under direct laparoscopic visualization. The robot was docked and I proceeded with a robotic portion of the case.  The pelvis was inspected and the uterus was found to have fibroids and be enlarged.  The fallopian tubes and ovaries were found to be normal. The remainder of her pelvis appeared normal.The ureters and the infundibulopelvic ligaments were identified. I excised the fallopian tubes bilaterally. The round ligament on each side was cauterized and cut. The Vessel Sealer instrument was used for this portion. The round ligaments were identified, cauterized and ligated, a bladder flap was created anteriorly. The uterine vessels were identified and cauterized and then cut.The bladder was pushed out of the operative site and an anterior colpotomy was made. The colpotomy incision was extended circumferentially, following the blue outline of the Rumi manipulator. All pedicles were hemostatic.  An attempt was made to remove the uterus through the vagina however, due to the size, it had to be morcellated through the vagina. The uterus was removed from the vagina with the fallopian tube segments. During the procedure the patient slid on the table and the robot was undocked to reposition patient and the vaginal cuff was closed from below with 0 vicryl.  Excellent hemostasis was noted.  At this point I  performed cystoscopy. The cystoscopy revealed blue ejection from both ureters. I rescrubbed and looked again laparoscopically to ensure excellent hemostasis. The pelvis was irrigated.  All instruments were removed  from the patients abdomen.  The midline fascial incision was closed with 0 vicryl.  The skin from all of the other ports was closed with 3-0 vicryl and Dermabond.  30cc of 0.5% Marcaine was injected into the port sites.  The patient was then extubated and taken to recovery in stable condition.   The case was prolonged due to patients size and increased time to get patient positioned.  The case had to be paused to reposition patient for the safest position to prevent injury to her extremities during the robotic portion which necessitated converted to completing the case vaginally.  The case was also prolonged due to the size of the patients uterus and the need to morcellate the uterus from the vagina (the endometrial biopsy was negative for malignancy)      The total procedure time was:  1311-1620 In room:  1240 Out of room:  1636  Sponge, lap and needle counts were correct x 2.  Jannat Rosemeyer L. Harraway-Smith, M.D., [facility]

## 2015-12-22 NOTE — Anesthesia Postprocedure Evaluation (Signed)
Anesthesia Post Note  Patient: [name]  Procedure(s) Performed: Procedure(s) (LRB): ROBOTIC ASSISTED TOTAL HYSTERECTOMY WITH BILATERAL SALPINGECTOMY (Bilateral)  Patient location during evaluation: PACU Anesthesia Type: General Level of consciousness: sedated Pain management: pain level controlled Vital Signs Assessment: post-procedure vital signs reviewed and stable Respiratory status: spontaneous breathing Cardiovascular status: stable Postop Assessment: no signs of nausea or vomiting Anesthetic complications: no     Last Vitals:  Filed Vitals:   12/22/15 1645 12/22/15 1800  BP: 115/71 113/66  Pulse: 129 122  Temp:  36.2 C  Resp: 39 29    Last Pain:  Filed Vitals:   12/22/15 1804  PainSc: 2    Pain Goal: Patients Stated Pain Goal: 3 (12/22/15 1800)               Solomon Skowronek JR,JOHN Torsha Lemus

## 2015-12-22 NOTE — Transfer of Care (Signed)
Immediate Anesthesia Transfer of Care Note  Patient: [name]  Procedure(s) Performed: Procedure(s): ROBOTIC ASSISTED TOTAL HYSTERECTOMY WITH BILATERAL SALPINGECTOMY (Bilateral)  Patient Location: PACU  Anesthesia Type:General  Level of Consciousness: awake, alert  and oriented  Airway & Oxygen Therapy: Patient Spontanous Breathing and Patient connected to nasal cannula oxygen  Post-op Assessment: Report given to RN, Post -op Vital signs reviewed and stable and Patient moving all extremities  Post vital signs: Reviewed and stable  Last Vitals:  Filed Vitals:   12/22/15 1145  BP: 146/102  Pulse: 118  Temp: 36.9 C  Resp: 18    Last Pain: There were no vitals filed for this visit.    Patients Stated Pain Goal: 3 ([id number])  Complications: No apparent anesthesia complications

## 2015-12-22 NOTE — Anesthesia Procedure Notes (Signed)
Procedure Name: Intubation Date/Time: 12/22/2015 12:53 PM Performed by: Tobin Chad Pre-anesthesia Checklist: Patient identified, Emergency Drugs available, Suction available, Patient being monitored and Timeout performed Patient Re-evaluated:Patient Re-evaluated prior to inductionOxygen Delivery Method: Circle system utilized and Simple face mask Preoxygenation: Pre-oxygenation with 100% oxygen Intubation Type: IV induction Ventilation: Mask ventilation without difficulty Laryngoscope Size: Mac and 3 Grade View: Grade II Tube size: 7.0 mm Number of attempts: 1 Placement Confirmation: ETT inserted through vocal cords under direct vision,  positive ETCO2 and breath sounds checked- equal and bilateral Secured at: 22 cm Tube secured with: Tape Dental Injury: Teeth and Oropharynx as per pre-operative assessment

## 2015-12-22 NOTE — Anesthesia Preprocedure Evaluation (Addendum)
Anesthesia Evaluation  Patient identified by MRN, date of birth, ID band Patient awake    Reviewed: Allergy & Precautions, H&P , NPO status , Patient's Chart, lab work & pertinent test results  Airway Mallampati: II  TM Distance: >3 FB Neck ROM: full    Dental no notable dental hx. (+) Dental Advisory Given, Teeth Intact   Pulmonary neg pulmonary ROS,    Pulmonary exam normal breath sounds clear to auscultation       Cardiovascular Exercise Tolerance: Good hypertension, Pt. on medications Normal cardiovascular exam Rhythm:regular Rate:Normal     Neuro/Psych negative neurological ROS  negative psych ROS   GI/Hepatic negative GI ROS, Neg liver ROS,   Endo/Other  diabetes, Well Controlled, Type 2, Oral Hypoglycemic AgentsMorbid obesity  Renal/GU negative Renal ROS  negative genitourinary   Musculoskeletal   Abdominal (+) + obese,   Peds  Hematology negative hematology ROS (+)   Anesthesia Other Findings   Reproductive/Obstetrics negative OB ROS                            Anesthesia Physical Anesthesia Plan  ASA: III  Anesthesia Plan: General   Post-op Pain Management:    Induction: Intravenous  Airway Management Planned: Oral ETT  Additional Equipment:   Intra-op Plan:   Post-operative Plan: Extubation in OR  Informed Consent: I have reviewed the patients History and Physical, chart, labs and discussed the procedure including the risks, benefits and alternatives for the proposed anesthesia with the patient or authorized representative who has indicated his/her understanding and acceptance.   Dental Advisory Given  Plan Discussed with: CRNA and Surgeon  Anesthesia Plan Comments:         Anesthesia Quick Evaluation

## 2015-12-22 NOTE — Brief Op Note (Signed)
12/22/2015  4:54 PM  PATIENT:  [name]  45 y.o. female  PRE-OPERATIVE DIAGNOSIS:  FIBROIDS ABNORMAL UTERINE BLEEDING   POST-OPERATIVE DIAGNOSIS:  FIBROIDS ABNORMAL UTERINE BLEEDING   PROCEDURE:  Procedure(s): ROBOTIC ASSISTED TOTAL HYSTERECTOMY WITH BILATERAL SALPINGECTOMY (Bilateral)  SURGEON:  Surgeon(s) and Role:    * Lavonia Drafts, MD - Primary  ANESTHESIA:   local and general  EBL:  Total I/O In: 1800 [I.V.:1800] Out: 475 [Urine:200; Blood:275]  BLOOD ADMINISTERED:none  DRAINS: none   LOCAL MEDICATIONS USED:  MARCAINE     SPECIMEN:  Source of Specimen:  Uterus, cervix and bilateral fallopian tubes   DISPOSITION OF SPECIMEN:  PATHOLOGY  COUNTS:  YES  TOURNIQUET:  * No tourniquets in log *  DICTATION: .Note written in EPIC  PLAN OF CARE: Admit for overnight observation  PATIENT DISPOSITION:  PACU - hemodynamically stable.   Delay start of Pharmacological VTE agent (>24hrs) due to surgical blood loss or risk of bleeding: no  Complications: none immediate  Florita Nitsch L. Harraway-Smith, M.D., Cherlynn June

## 2015-12-23 ENCOUNTER — Encounter (HOSPITAL_COMMUNITY): Payer: Self-pay | Admitting: Obstetrics & Gynecology

## 2015-12-23 DIAGNOSIS — D259 Leiomyoma of uterus, unspecified: Secondary | ICD-10-CM | POA: Diagnosis not present

## 2015-12-23 LAB — CBC
HEMATOCRIT: 29.5 % — AB (ref 36.0–46.0)
HEMOGLOBIN: 9.7 g/dL — AB (ref 12.0–15.0)
MCH: 27.8 pg (ref 26.0–34.0)
MCHC: 32.9 g/dL (ref 30.0–36.0)
MCV: 84.5 fL (ref 78.0–100.0)
Platelets: 238 10*3/uL (ref 150–400)
RBC: 3.49 MIL/uL — AB (ref 3.87–5.11)
RDW: 13.7 % (ref 11.5–15.5)
WBC: 10.2 10*3/uL (ref 4.0–10.5)

## 2015-12-23 LAB — GLUCOSE, CAPILLARY
GLUCOSE-CAPILLARY: 165 mg/dL — AB (ref 65–99)
Glucose-Capillary: 140 mg/dL — ABNORMAL HIGH (ref 65–99)

## 2015-12-23 LAB — BASIC METABOLIC PANEL
Anion gap: 6 (ref 5–15)
BUN: 7 mg/dL (ref 6–20)
CHLORIDE: 105 mmol/L (ref 101–111)
CO2: 25 mmol/L (ref 22–32)
Calcium: 8.2 mg/dL — ABNORMAL LOW (ref 8.9–10.3)
Creatinine, Ser: 0.53 mg/dL (ref 0.44–1.00)
GFR calc Af Amer: >60 mL/min (ref >60)
GFR calc non Af Amer: >60 mL/min (ref >60)
GLUCOSE: 151 mg/dL — AB (ref 65–99)
POTASSIUM: 3.8 mmol/L (ref 3.5–5.1)
SODIUM: 136 mmol/L (ref 135–145)

## 2015-12-23 MED ORDER — GLIMEPIRIDE 4 MG PO TABS: 4.0000 mg | ORAL_TABLET | Freq: Every day | ORAL | Status: DC

## 2015-12-23 MED ORDER — TRAMADOL HCL 50 MG PO TABS: 50.0000 mg | ORAL_TABLET | Freq: Four times a day (QID) | ORAL | Status: DC | PRN

## 2015-12-23 MED ORDER — IBUPROFEN 600 MG PO TABS: 600.0000 mg | ORAL_TABLET | Freq: Four times a day (QID) | ORAL | Status: DC | PRN

## 2015-12-23 MED ORDER — DOCUSATE SODIUM 100 MG PO CAPS: 100.0000 mg | ORAL_CAPSULE | Freq: Two times a day (BID) | ORAL | Status: DC

## 2015-12-23 NOTE — Discharge Summary (Signed)
Physician Discharge Summary  Patient ID: [name] MRN: [id number] DOB/AGE: [birth year] 45 y.o.  Admit date: 12/22/2015 Discharge date: 12/23/2015  Admission Diagnoses: AUB; Fibroids; Diabetes- uncontrolled  Discharge Diagnoses:  Principal Problem:   Fibroids Active Problems:   Diabetes mellitus ([place])   Obesity   DUB (dysfunctional uterine bleeding)   Post-operative state   Discharged Condition: good  Hospital Course: Patient had an uncomplicated surgery; for further details of this surgery, please refer to the operative note. Furthermore, the patient had an uncomplicated postoperative course.  By time of discharge, her pain was controlled on oral pain medications; she was ambulating, voiding without difficulty, tolerating regular diet and passing flatus.  She was deemed stable for discharge to home.    Consults: None  Significant Diagnostic Studies: labs: CBC, BMP  Treatments: surgery: Robot Assisted laparoscopic Vaginal hysterectomy with bilateral salpingectomy  Discharge Exam: Blood pressure 114/73, pulse 109, temperature 98.5 F (36.9 C), temperature source Axillary, resp. rate 20, height 5\' 2"  (1.575 m), weight 258 lb (117.028 kg), last menstrual period 10/02/2015, SpO2 97 %.   I/O last 3 completed shifts: In: 1223.3 [P.O.:140; I.V.:1083.3] Out: 925 [Urine:925] Total I/O In: 3423.3 [P.O.:140; I.V.:3283.3] Out: 1725 [Urine:1450; Blood:275]   General appearance: alert and no distress Resp: clear to auscultation bilaterally Cardio: regular rate and rhythm, S1, S2 normal, no murmur, click, rub or gallop GI: soft, non-tender; bowel sounds normal; no masses,  no organomegaly Extremities: extremities normal, atraumatic, no cyanosis or edema Incision/Wound:dressings and port sites clean and dry CBC Latest Ref Rng 12/23/2015 12/21/2015 09/23/2015  WBC 4.0 - 10.5 K/uL 10.2 10.0 10.4  Hemoglobin 12.0 - 15.0 g/dL 9.7(L) 11.6(L) 11.2(L)  Hematocrit 36.0 - 46.0 % 29.5(L)  35.1(L) 32.6(L)  Platelets 150 - 400 K/uL 238 283 204   BMP Latest Ref Rng 12/23/2015 12/21/2015 12/06/2013  Glucose 65 - 99 mg/dL 151(H) 171(H) 185(H)  BUN 6 - 20 mg/dL 7 10 11   Creatinine 0.44 - 1.00 mg/dL 0.53 0.63 0.64  Sodium 135 - 145 mmol/L 136 138 137  Potassium 3.5 - 5.1 mmol/L 3.8 3.4(L) 4.4  Chloride 101 - 111 mmol/L 105 103 99  CO2 22 - 32 mmol/L 25 24 25   Calcium 8.9 - 10.3 mg/dL 8.2(L) 9.3 9.5     Disposition: 01-Home or Self Care  Discharge Instructions    Call MD for:  difficulty breathing, headache or visual disturbances    Complete by:  As directed      Call MD for:  extreme fatigue    Complete by:  As directed      Call MD for:  hives    Complete by:  As directed      Call MD for:  persistant dizziness or light-headedness    Complete by:  As directed      Call MD for:  persistant nausea and vomiting    Complete by:  As directed      Call MD for:  redness, tenderness, or signs of infection (pain, swelling, redness, odor or green/yellow discharge around incision site)    Complete by:  As directed      Call MD for:  severe uncontrolled pain    Complete by:  As directed      Call MD for:  temperature >100.4    Complete by:  As directed      Diet - low sodium heart healthy    Complete by:  As directed      Driving Restrictions    Complete  by:  As directed   No driving for 2 weeks     Increase activity slowly    Complete by:  As directed      Lifting restrictions    Complete by:  As directed   No heavy lifting for 4 weeks     Sexual Activity Restrictions    Complete by:  As directed   No sexual activity/intercourse for 8 weeks            Medication List    STOP taking these medications        phentermine 37.5 MG tablet  Commonly known as:  ADIPEX-P      TAKE these medications        docusate sodium 100 MG capsule  Commonly known as:  COLACE  Take 1 capsule (100 mg total) by mouth 2 (two) times daily.     glimepiride 4 MG tablet  Commonly  known as:  AMARYL  Take 1 tablet (4 mg total) by mouth daily with breakfast.     ibuprofen 600 MG tablet  Commonly known as:  ADVIL,MOTRIN  Take 1 tablet (600 mg total) by mouth every 6 (six) hours as needed (mild pain).     lisinopril 5 MG tablet  Commonly known as:  PRINIVIL,ZESTRIL  Take 5 mg by mouth daily.     sitaGLIPtin-metformin 50-1000 MG tablet  Commonly known as:  JANUMET  Take 1 tablet by mouth 2 (two) times daily with a meal.     traMADol 50 MG tablet  Commonly known as:  ULTRAM  Take 1 tablet (50 mg total) by mouth every 6 (six) hours as needed for moderate pain.           Follow-up Information    Follow up with Lavonia Drafts, MD In 2 weeks.   Specialty:  Obstetrics and Gynecology   Contact information:   [place] [address] [phone]       Signed: Lavonia Drafts 12/23/2015, 12:31 PM

## 2015-12-23 NOTE — Discharge Instructions (Signed)
Total Laparoscopic Hysterectomy, Care After °Refer to this sheet in the next few weeks. These instructions provide you with information on caring for yourself after your procedure. Your health care provider may also give you more specific instructions. Your treatment has been planned according to current medical practices, but problems sometimes occur. Call your health care provider if you have any problems or questions after your procedure. °WHAT TO EXPECT AFTER THE PROCEDURE °· Pain and bruising at the incision sites. You will be given pain medicine to control it. °· Menopausal symptoms such as hot flashes, night sweats, and insomnia if your ovaries were removed. °· Sore throat from the breathing tube that was inserted during surgery. °HOME CARE INSTRUCTIONS °· Only take over-the-counter or prescription medicines for pain, discomfort, or fever as directed by your health care provider.   °· Do not take aspirin. It can cause bleeding.   °· Do not drive when taking pain medicine.   °· Follow your health care provider's advice regarding diet, exercise, lifting, driving, and general activities.   °· Resume your usual diet as directed and allowed.   °· Get plenty of rest and sleep.   °· Do not douche, use tampons, or have sexual intercourse for at least 6 weeks, or until your health care provider gives you permission.   °· Change your bandages (dressings) as directed by your health care provider.   °· Monitor your temperature and notify your health care provider of a fever.   °· Take showers instead of baths for 2-3 weeks.   °· Do not drink alcohol until your health care provider gives you permission.   °· If you develop constipation, you may take a mild laxative with your health care provider's permission. Bran foods may help with constipation problems. Drinking enough fluids to keep your urine clear or pale yellow may help as well.   °· Try to have someone home with you for 1-2 weeks to help around the house.    °· Keep all of your follow-up appointments as directed by your health care provider.   °SEEK MEDICAL CARE IF: °· You have swelling, redness, or increasing pain around your incision sites.   °· You have pus coming from your incision.   °· You notice a bad smell coming from your incision.   °· Your incision breaks open.   °· You feel dizzy or lightheaded.   °· You have pain or bleeding when you urinate.   °· You have persistent diarrhea.   °· You have persistent nausea and vomiting.   °· You have abnormal vaginal discharge.   °· You have a rash.   °· You have any type of abnormal reaction or develop an allergy to your medicine.   °· You have poor pain control with your prescribed medicine.   °SEEK IMMEDIATE MEDICAL CARE IF: °· You have chest pain or shortness of breath. °· You have severe abdominal pain that is not relieved with pain medicine. °· You have pain or swelling in your legs. °MAKE SURE YOU: °· Understand these instructions. °· Will watch your condition. °· Will get help right away if you are not doing well or get worse. °  °This information is not intended to replace advice given to you by your health care provider. Make sure you discuss any questions you have with your health care provider. °  °Document Released: 04/03/2013 Document Revised: 06/18/2013 Document Reviewed: 04/03/2013 °Elsevier Interactive Patient Education ©2016 [employer] ° °

## 2015-12-23 NOTE — Addendum Note (Signed)
Addendum  created 12/23/15 0827 by Riki Sheer, CRNA   Modules edited: Clinical Notes   Clinical Notes:  File: [id number]

## 2015-12-23 NOTE — Anesthesia Postprocedure Evaluation (Signed)
Anesthesia Post Note  Patient: [name]  Procedure(s) Performed: Procedure(s) (LRB): ROBOTIC ASSISTED TOTAL HYSTERECTOMY WITH BILATERAL SALPINGECTOMY (Bilateral)  Patient location during evaluation: [facility] Anesthesia Type: General Level of consciousness: awake and alert Pain management: pain level controlled Vital Signs Assessment: post-procedure vital signs reviewed and stable Respiratory status: spontaneous breathing, nonlabored ventilation and respiratory function stable Cardiovascular status: blood pressure returned to baseline and stable Postop Assessment: no signs of nausea or vomiting Anesthetic complications: no     Last Vitals:  Filed Vitals:   12/23/15 0143 12/23/15 0530  BP: 130/75 113/70  Pulse: 109 81  Temp: 36.7 C 37.2 C  Resp: 20 20    Last Pain:  Filed Vitals:   12/23/15 0640  PainSc: Asleep   Pain Goal: Patients Stated Pain Goal: 3 (12/23/15 0325)               Riki Sheer

## 2015-12-23 NOTE — Progress Notes (Signed)
Patient discharged home. Patient education and paperwork completed with patient and family. Patient has no questions at this time.

## 2016-01-07 ENCOUNTER — Encounter: Payer: Self-pay | Admitting: Obstetrics & Gynecology

## 2016-01-07 ENCOUNTER — Ambulatory Visit (INDEPENDENT_AMBULATORY_CARE_PROVIDER_SITE_OTHER): Payer: BLUE CROSS/BLUE SHIELD | Admitting: Obstetrics & Gynecology

## 2016-01-07 VITALS — BP 150/81 | HR 98 | Wt 270.6 lb

## 2016-01-07 DIAGNOSIS — Z9889 Other specified postprocedural states: Secondary | ICD-10-CM

## 2016-01-07 NOTE — Patient Instructions (Signed)
Total Laparoscopic Hysterectomy, Care After °Refer to this sheet in the next few weeks. These instructions provide you with information on caring for yourself after your procedure. Your health care provider may also give you more specific instructions. Your treatment has been planned according to current medical practices, but problems sometimes occur. Call your health care provider if you have any problems or questions after your procedure. °WHAT TO EXPECT AFTER THE PROCEDURE °· Pain and bruising at the incision sites. You will be given pain medicine to control it. °· Menopausal symptoms such as hot flashes, night sweats, and insomnia if your ovaries were removed. °· Sore throat from the breathing tube that was inserted during surgery. °HOME CARE INSTRUCTIONS °· Only take over-the-counter or prescription medicines for pain, discomfort, or fever as directed by your health care provider.   °· Do not take aspirin. It can cause bleeding.   °· Do not drive when taking pain medicine.   °· Follow your health care provider's advice regarding diet, exercise, lifting, driving, and general activities.   °· Resume your usual diet as directed and allowed.   °· Get plenty of rest and sleep.   °· Do not douche, use tampons, or have sexual intercourse for at least 6 weeks, or until your health care provider gives you permission.   °· Change your bandages (dressings) as directed by your health care provider.   °· Monitor your temperature and notify your health care provider of a fever.   °· Take showers instead of baths for 2-3 weeks.   °· Do not drink alcohol until your health care provider gives you permission.   °· If you develop constipation, you may take a mild laxative with your health care provider's permission. Bran foods may help with constipation problems. Drinking enough fluids to keep your urine clear or pale yellow may help as well.   °· Try to have someone home with you for 1-2 weeks to help around the house.    °· Keep all of your follow-up appointments as directed by your health care provider.   °SEEK MEDICAL CARE IF: °· You have swelling, redness, or increasing pain around your incision sites.   °· You have pus coming from your incision.   °· You notice a bad smell coming from your incision.   °· Your incision breaks open.   °· You feel dizzy or lightheaded.   °· You have pain or bleeding when you urinate.   °· You have persistent diarrhea.   °· You have persistent nausea and vomiting.   °· You have abnormal vaginal discharge.   °· You have a rash.   °· You have any type of abnormal reaction or develop an allergy to your medicine.   °· You have poor pain control with your prescribed medicine.   °SEEK IMMEDIATE MEDICAL CARE IF: °· You have chest pain or shortness of breath. °· You have severe abdominal pain that is not relieved with pain medicine. °· You have pain or swelling in your legs. °MAKE SURE YOU: °· Understand these instructions. °· Will watch your condition. °· Will get help right away if you are not doing well or get worse. °  °This information is not intended to replace advice given to you by your health care provider. Make sure you discuss any questions you have with your health care provider. °  °Document Released: 04/03/2013 Document Revised: 06/18/2013 Document Reviewed: 04/03/2013 °Elsevier Interactive Patient Education ©2016 [employer] ° °

## 2016-01-07 NOTE — Progress Notes (Signed)
History:  45 y.o. [id number] here today for 2 week post op check.  Pt reports that her glc have been ~150's.  She reports that she has been trying to maintain the diet that was prescribed to her.  She denies bleeding or pain.  She is off all pain medications.   The following portions of the patient's history were reviewed and updated as appropriate: allergies, current medications, past family history, past medical history, past social history, past surgical history and problem list.  Review of Systems:  Pertinent items are noted in HPI.  Objective:  Physical Exam Blood pressure 150/81, pulse 98, weight 270 lb 9.6 oz (122.743 kg), last menstrual period 10/02/2015. Gen: NAD Abd: Soft, nontender and nondistended. Port sites healing well. Pelvic: Normal appearing external genitalia; normal appearing vaginal mucosa and cervix.  Normal discharge.  Small uterus, no other palpable masses, no uterine or adnexal tenderness  Labs and Imaging 12/22/2015 Diagnosis Uterus, cervix and bilateral fallopian tubes - UTERUS: -ENDOMETRIUM: ENDOMETRIAL TYPE POLYP. INACTIVE ENDOMETRIUM. NO HYPERPLASIA OR MALIGNANCY. -MYOMETRIUM: ADENOMYOSIS. NO MALIGNANCY. -SEROSA: UNREMARKABLE. NO MALIGNANCY. - CERVIX: BENIGN SQUAMOUS AND ENDOCERVICAL MUCOSA. NO DYSPLASIA OR MALIGNANCY. - BILATERAL FALLOPIAN TUBES: UNREMARKABLE. NO MALIGNANCY.  Assessment & Plan:  2 week post op check.  Doing well Reviewed surg path  F/u in 4 weeks or sooner prn  Najma Bozarth L. Harraway-Smith, M.D., Cherlynn June

## 2016-02-05 ENCOUNTER — Encounter: Payer: Self-pay | Admitting: Family Medicine

## 2016-02-05 ENCOUNTER — Ambulatory Visit (INDEPENDENT_AMBULATORY_CARE_PROVIDER_SITE_OTHER): Payer: BLUE CROSS/BLUE SHIELD | Admitting: Obstetrics & Gynecology

## 2016-02-05 ENCOUNTER — Encounter: Payer: Self-pay | Admitting: Obstetrics & Gynecology

## 2016-02-05 VITALS — BP 174/88 | HR 72 | Wt 257.7 lb

## 2016-02-05 DIAGNOSIS — Z9889 Other specified postprocedural states: Secondary | ICD-10-CM

## 2016-02-05 DIAGNOSIS — I10 Essential (primary) hypertension: Secondary | ICD-10-CM

## 2016-02-05 NOTE — Progress Notes (Signed)
History:  45 y.o. G3P3003 here today for 6 week post op check.  Pt is gradually increasing her activities. She denies bleeding or pain.  She has not been sexually active since she delivered.    The following portions of the patient's history were reviewed and updated as appropriate: allergies, current medications, past family history, past medical history, past social history, past surgical history and problem list.  Review of Systems:  Pertinent items are noted in HPI.  Objective:  Physical Exam Blood pressure (!) 174/88, pulse 72, weight 257 lb 11.2 oz (116.9 kg). Gen: NAD Abd: Soft, nontender and nondistended Pelvic: Normal appearing external genitalia; normal appearing vaginal mucosa. Cuff is healing well. No  adnexal tenderness  Labs and Imaging No results found.  Assessment & Plan:  Chronic HTN- pt left home without her meds today.  Will take as soon as she gets home 6 weeks post op check- doing well  Return to full activities. Resume reg BP meds F/u in 3 months or sooner prn  Emanual Lamountain L. Harraway-Smith, M.D., Cherlynn June

## 2016-02-05 NOTE — Patient Instructions (Signed)
Total Laparoscopic Hysterectomy, Care After °Refer to this sheet in the next few weeks. These instructions provide you with information on caring for yourself after your procedure. Your health care provider may also give you more specific instructions. Your treatment has been planned according to current medical practices, but problems sometimes occur. Call your health care provider if you have any problems or questions after your procedure. °WHAT TO EXPECT AFTER THE PROCEDURE °· Pain and bruising at the incision sites. You will be given pain medicine to control it. °· Menopausal symptoms such as hot flashes, night sweats, and insomnia if your ovaries were removed. °· Sore throat from the breathing tube that was inserted during surgery. °HOME CARE INSTRUCTIONS °· Only take over-the-counter or prescription medicines for pain, discomfort, or fever as directed by your health care provider.   °· Do not take aspirin. It can cause bleeding.   °· Do not drive when taking pain medicine.   °· Follow your health care provider's advice regarding diet, exercise, lifting, driving, and general activities.   °· Resume your usual diet as directed and allowed.   °· Get plenty of rest and sleep.   °· Do not douche, use tampons, or have sexual intercourse for at least 6 weeks, or until your health care provider gives you permission.   °· Change your bandages (dressings) as directed by your health care provider.   °· Monitor your temperature and notify your health care provider of a fever.   °· Take showers instead of baths for 2-3 weeks.   °· Do not drink alcohol until your health care provider gives you permission.   °· If you develop constipation, you may take a mild laxative with your health care provider's permission. Bran foods may help with constipation problems. Drinking enough fluids to keep your urine clear or pale yellow may help as well.   °· Try to have someone home with you for 1-2 weeks to help around the house.    °· Keep all of your follow-up appointments as directed by your health care provider.   °SEEK MEDICAL CARE IF: °· You have swelling, redness, or increasing pain around your incision sites.   °· You have pus coming from your incision.   °· You notice a bad smell coming from your incision.   °· Your incision breaks open.   °· You feel dizzy or lightheaded.   °· You have pain or bleeding when you urinate.   °· You have persistent diarrhea.   °· You have persistent nausea and vomiting.   °· You have abnormal vaginal discharge.   °· You have a rash.   °· You have any type of abnormal reaction or develop an allergy to your medicine.   °· You have poor pain control with your prescribed medicine.   °SEEK IMMEDIATE MEDICAL CARE IF: °· You have chest pain or shortness of breath. °· You have severe abdominal pain that is not relieved with pain medicine. °· You have pain or swelling in your legs. °MAKE SURE YOU: °· Understand these instructions. °· Will watch your condition. °· Will get help right away if you are not doing well or get worse. °  °This information is not intended to replace advice given to you by your health care provider. Make sure you discuss any questions you have with your health care provider. °  °Document Released: 04/03/2013 Document Revised: 06/18/2013 Document Reviewed: 04/03/2013 °Elsevier Interactive Patient Education ©2016 [employer] ° °

## 2016-02-28 ENCOUNTER — Emergency Department (HOSPITAL_COMMUNITY)
Admission: EM | Admit: 2016-02-28 | Discharge: 2016-02-28 | Disposition: A | Discharge status: Home / Self Care | Payer: BLUE CROSS/BLUE SHIELD | Attending: Emergency Medicine | Admitting: Emergency Medicine

## 2016-02-28 ENCOUNTER — Encounter (HOSPITAL_COMMUNITY): Payer: Self-pay

## 2016-02-28 DIAGNOSIS — Z79899 Other long term (current) drug therapy: Secondary | ICD-10-CM | POA: Insufficient documentation

## 2016-02-28 DIAGNOSIS — I10 Essential (primary) hypertension: Secondary | ICD-10-CM | POA: Insufficient documentation

## 2016-02-28 DIAGNOSIS — G500 Trigeminal neuralgia: Secondary | ICD-10-CM | POA: Diagnosis not present

## 2016-02-28 DIAGNOSIS — Z7984 Long term (current) use of oral hypoglycemic drugs: Secondary | ICD-10-CM | POA: Insufficient documentation

## 2016-02-28 DIAGNOSIS — E119 Type 2 diabetes mellitus without complications: Secondary | ICD-10-CM | POA: Insufficient documentation

## 2016-02-28 DIAGNOSIS — R51 Headache: Secondary | ICD-10-CM | POA: Diagnosis present

## 2016-02-28 MED ORDER — NAPROXEN 500 MG PO TABS: 500.0000 mg | ORAL_TABLET | Freq: Two times a day (BID) | ORAL | 0 refills | Status: DC

## 2016-02-28 MED ORDER — CARBAMAZEPINE 200 MG PO TABS: ORAL_TABLET | ORAL | 0 refills | Status: DC

## 2016-02-28 NOTE — ED Provider Notes (Signed)
[facility] DEPT Provider Note   CSN: [id number] Arrival date & time: 02/28/16  [id number]     History   Chief Complaint Chief Complaint  Patient presents with  . Facial Pain    HPI [name] is a 45 y.o. female.  HPI   [name] is a 45 y.o. female, with a history of DM, HTN, and anemia, presenting to the ED with Left-sided facial pain intermittently for the last week. Patient states that she has had nasal congestion and sinus pressure that preceded the facial pain for a couple days. Her facial pain feels as though it is shooting across the left side of her face and jaw and comes in waves. Patient has not tried any treatments for her symptoms. Denies fever/chills, nausea/vomiting, difficulty breathing or swallowing, neuro deficits, or any other complaints.     Past Medical History:  Diagnosis Date  . Anemia   . Diabetes mellitus    Type 2  . Fibroid   . Hypertension   . Infection    UTI  . Vaginal delivery [phone]  . Vaginal Pap smear, abnormal     Patient Active Problem List   Diagnosis Date Noted  . Fibroids 12/22/2015  . Post-operative state 12/22/2015  . DUB (dysfunctional uterine bleeding) 10/12/2015  . Obesity 03/01/2012  . Diabetes mellitus ([place]) 10/26/2011    Past Surgical History:  Procedure Laterality Date  . ABDOMINAL HYSTERECTOMY    . KNEE SURGERY     x 3  . ROBOTIC ASSISTED TOTAL HYSTERECTOMY WITH SALPINGECTOMY Bilateral 12/22/2015   Procedure: ROBOTIC ASSISTED TOTAL HYSTERECTOMY WITH BILATERAL SALPINGECTOMY;  Surgeon: Lavonia Drafts, MD;  Location: [facility];  Service: Gynecology;  Laterality: Bilateral;    OB History    Gravida Para Term Preterm AB Living   3 3 3     3    SAB TAB Ectopic Multiple Live Births                   Home Medications    Prior to Admission medications   Medication Sig Start Date End Date Taking? Authorizing Provider  glimepiride (AMARYL) 4 MG tablet Take 1 tablet (4 mg total) by mouth  daily with breakfast. 12/23/15  Yes Lavonia Drafts, MD  lisinopril (PRINIVIL,ZESTRIL) 5 MG tablet Take 5 mg by mouth daily.   Yes Historical Provider, MD  montelukast (SINGULAIR) 10 MG tablet Take 10 mg by mouth at bedtime. 02/24/16  Yes Historical Provider, MD  sitaGLIPtin-metformin (JANUMET) 50-1000 MG per tablet Take 1 tablet by mouth 2 (two) times daily with a meal.   Yes Historical Provider, MD  carbamazepine (TEGRETOL) 200 MG tablet 200 mg day 1, 200 mg twice daily for day 2 and 3, 400 mg twice daily for day 3 and 4. Use 400 mg twice daily, as needed, on days 5 through 7. 02/28/16   Torryn Hudspeth C Chiann Goffredo, PA-C  docusate sodium (COLACE) 100 MG capsule Take 1 capsule (100 mg total) by mouth 2 (two) times daily. Patient not taking: Reported on 02/28/2016 12/23/15   Lavonia Drafts, MD  ibuprofen (ADVIL,MOTRIN) 600 MG tablet Take 1 tablet (600 mg total) by mouth every 6 (six) hours as needed (mild pain). 12/23/15   Lavonia Drafts, MD  naproxen (NAPROSYN) 500 MG tablet Take 1 tablet (500 mg total) by mouth 2 (two) times daily. 02/28/16   Ravneet Spilker C Zyrah Wiswell, PA-C  traMADol (ULTRAM) 50 MG tablet Take 1 tablet (50 mg total) by mouth every 6 (six) hours as needed for  moderate pain. Patient not taking: Reported on 02/05/2016 12/23/15   Lavonia Drafts, MD    Family History Family History  Problem Relation Age of Onset  . Hypertension Mother   . Diabetes Father   . Hypertension Father   . Hypertension Maternal Grandmother   . Hypertension Maternal Grandfather   . Hypertension Paternal Grandmother   . Hypertension Paternal Grandfather     Social History Social History  Substance Use Topics  . Smoking status: Never Smoker  . Smokeless tobacco: Never Used  . Alcohol use No     Allergies   Bactrim [sulfamethoxazole-trimethoprim] and Percocet [oxycodone-acetaminophen]   Review of Systems Review of Systems  Constitutional: Negative for chills and fever.  HENT: Positive for congestion,  rhinorrhea and sinus pressure. Negative for ear discharge, ear pain, facial swelling, sore throat, trouble swallowing and voice change.   Respiratory: Negative for cough, shortness of breath and stridor.   Gastrointestinal: Negative for nausea and vomiting.  All other systems reviewed and are negative.   Physical Exam Updated Vital Signs BP 136/84 (BP Location: Right Arm)   Pulse 92   Temp 97.8 F (36.6 C) (Oral)   Ht 5\' 2"  (1.575 m)   Wt 113.4 kg   SpO2 99%   BMI 45.73 kg/m   Physical Exam  Constitutional: She appears well-developed and well-nourished. No distress.  HENT:  Head: Normocephalic and atraumatic.  Right Ear: Tympanic membrane, external ear and ear canal normal.  Left Ear: Tympanic membrane, external ear and ear canal normal.  Mouth/Throat: Uvula is midline, oropharynx is clear and moist and mucous membranes are normal.  Some tenderness to the left side of the face in the distribution of the trigeminal nerve.  Eyes: Conjunctivae are normal.  Neck: Neck supple.  Cardiovascular: Normal rate, regular rhythm, normal heart sounds and intact distal pulses.   Pulmonary/Chest: Effort normal and breath sounds normal. No respiratory distress.  Abdominal: Soft. There is no tenderness. There is no guarding.  Musculoskeletal: She exhibits no edema or tenderness.  Lymphadenopathy:    She has no cervical adenopathy.  Neurological: She is alert.  Skin: Skin is warm and dry. She is not diaphoretic.  Psychiatric: She has a normal mood and affect. Her behavior is normal.  Nursing note and vitals reviewed.    ED Treatments / Results  Labs (all labs ordered are listed, but only abnormal results are displayed) Labs Reviewed - No data to display  EKG  EKG Interpretation None       Radiology No results found.  Procedures Procedures (including critical care time)  Medications Ordered in ED Medications - No data to display   Initial Impression / Assessment and Plan /  ED Course  I have reviewed the triage vital signs and the nursing notes.  Pertinent labs & imaging results that were available during my care of the patient were reviewed by me and considered in my medical decision making (see chart for details).  Clinical Course    [name] presents with left-sided facial pain for the last week. Patient's original symptoms are consistent with sinusitis, most likely viral. Patient's current symptoms may be due to trigeminal neuralgia. Appropriate therapy discussed along with return precautions. Patient to follow up with her PCP for continued symptoms. Patient voiced understanding of all instructions and is comfortable with discharge.  Vitals:   02/28/16 0845 02/28/16 0846 02/28/16 0900 02/28/16 0920  BP: 136/84  135/90   Pulse: 92  91   Temp: 97.8 F (36.6  C)   98.7 F (37.1 C)  TempSrc: Oral     SpO2: 99%  99%   Weight:  113.4 kg    Height:  5\' 2"  (1.575 m)       Final Clinical Impressions(s) / ED Diagnoses   Final diagnoses:  Trigeminal neuralgia of left side of face    New Prescriptions New Prescriptions   CARBAMAZEPINE (TEGRETOL) 200 MG TABLET    200 mg day 1, 200 mg twice daily for day 2 and 3, 400 mg twice daily for day 3 and 4. Use 400 mg twice daily, as needed, on days 5 through 7.   NAPROXEN (NAPROSYN) 500 MG TABLET    Take 1 tablet (500 mg total) by mouth 2 (two) times daily.     Lorayne Bender, PA-C 02/28/16 [place], MD 02/29/16 [phone]

## 2016-02-28 NOTE — Discharge Instructions (Signed)
You have been seen today for facial pain. This pain may be due to a sinus infection, but may also be due to a condition called trigeminal neuralgia. Take the carbamazepine as directed for no more than 7 days. Follow up with PCP as needed should symptoms fail to resolve. Return to ED should symptoms worsen.  Most sinus infections or viruses. Viruses do not require antibiotics. Treatment is symptomatic care. Drink plenty of fluids and get plenty of rest. You should be drinking at least one half to one liter of water an hour to stay hydrated. Ibuprofen, Naproxen, or Tylenol for pain or fever. Plain Mucinex may help relieve congestion. Warm liquids or Chloraseptic spray may help soothe a sore throat.

## 2016-02-28 NOTE — ED Triage Notes (Signed)
Per pt: She started having a headache last week "and then it just moved down my face and into my jaw, I think it's a sinus infection".  Pt states that she has been taking "sinus congestion and my last dose was before 10 pm yesterday, I don't think that it has been helping".

## 2017-03-11 IMAGING — US US PELVIS COMPLETE
1 series · 15 of 25 positions shown · non-contrast
Comparison: None

CLINICAL DATA: Abnormal uterine bleeding. Chronic anemia due to
blood loss. Morbid obesity. LMP 09/15/2015.

EXAM:
TRANSABDOMINAL AND TRANSVAGINAL ULTRASOUND OF PELVIS
TECHNIQUE: Both transabdominal and transvaginal ultrasound examinations of the
pelvis were performed. Transabdominal technique was performed for
global imaging of the pelvis including uterus, ovaries, adnexal
regions, and pelvic cul-de-sac. It was necessary to proceed with
endovaginal exam following the transabdominal exam to visualize the
endometrium and ovaries.

[Series 1: us pelvis complete · 15 of 79 slices shown]
[im 1/79]
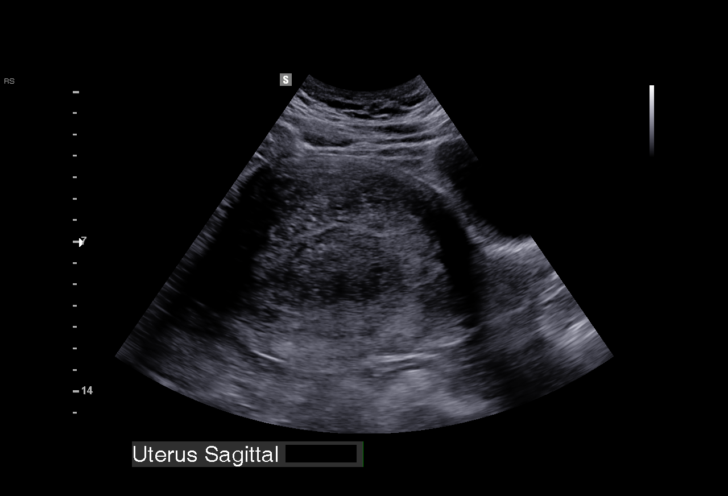
[im 7/79]
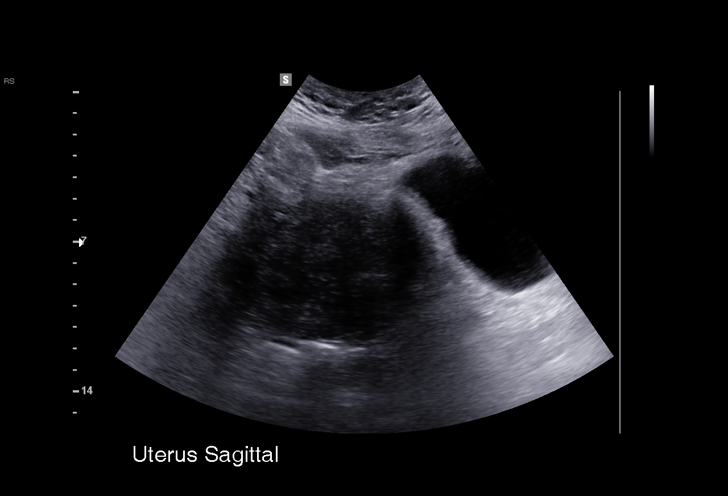
[im 14/79]
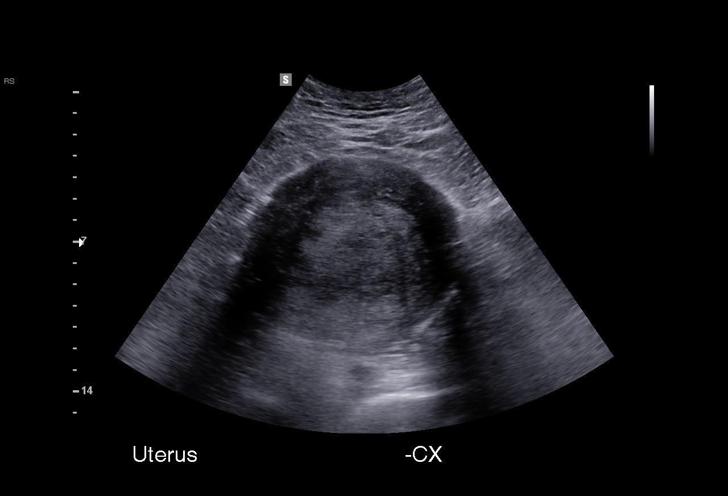
[im 17/79]
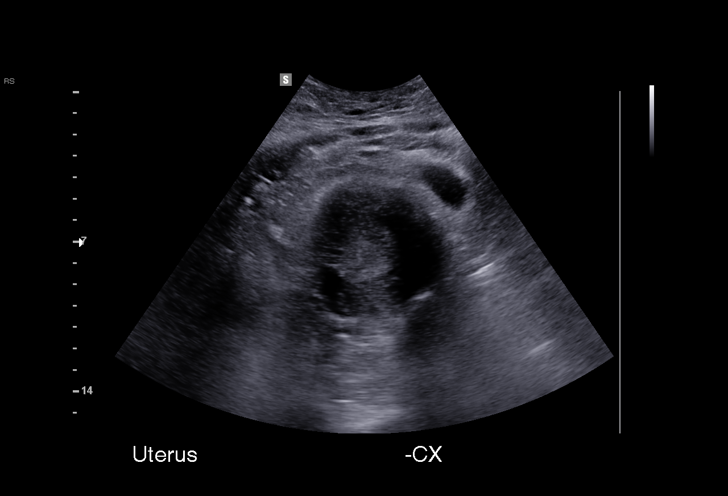
[im 23/79]
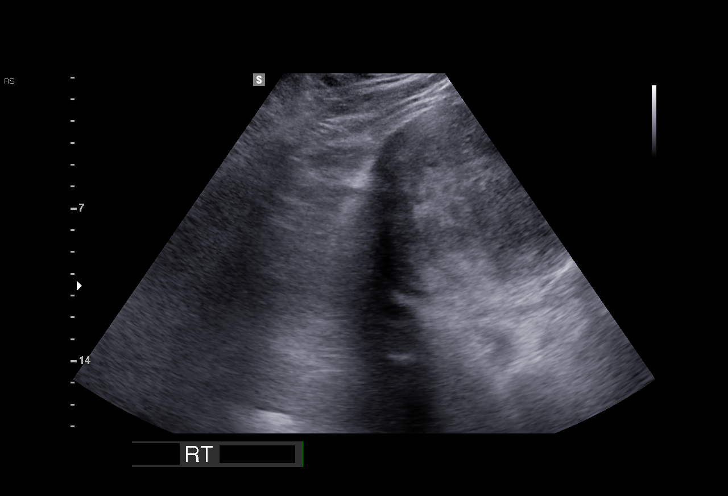
[im 30/79]
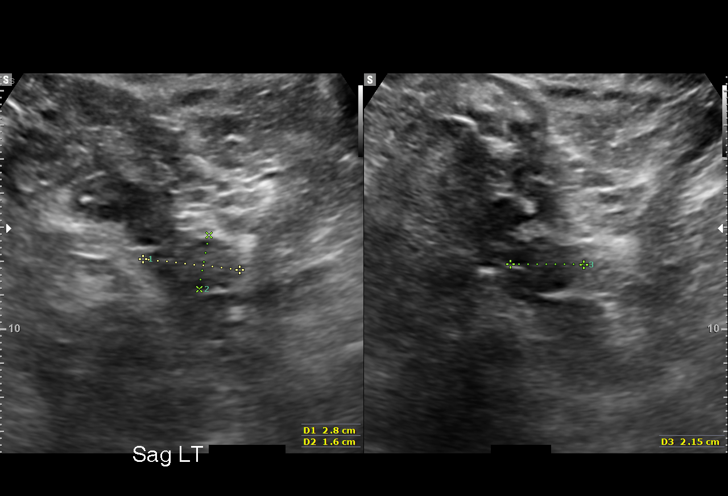
[im 33/79]
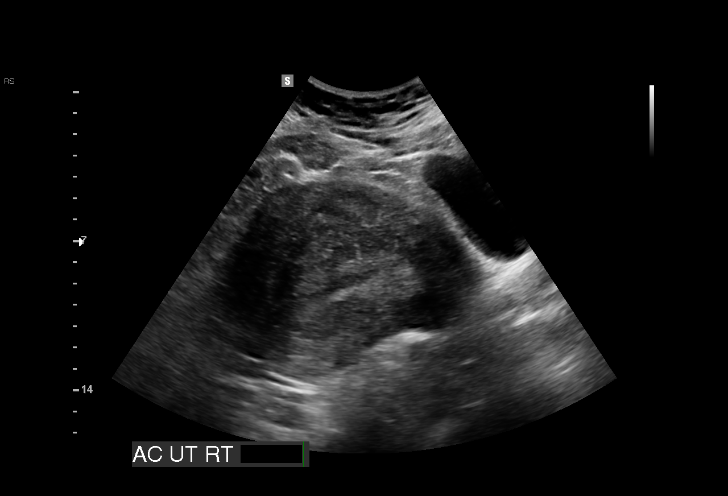
[im 40/79]
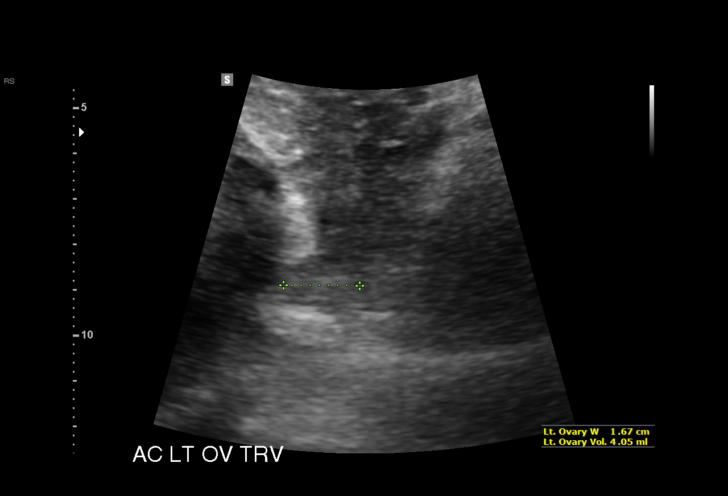
[im 46/79]
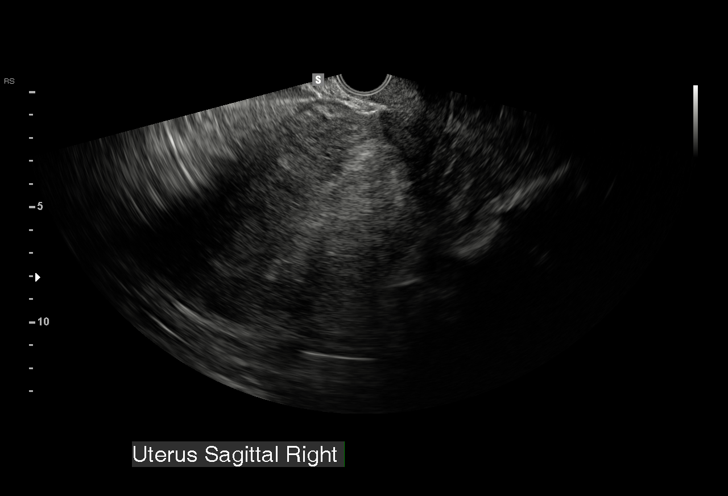
[im 49/79]
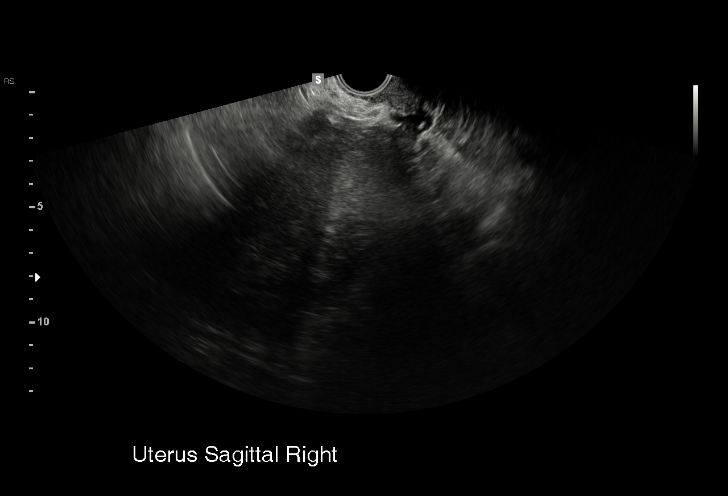
[im 56/79]
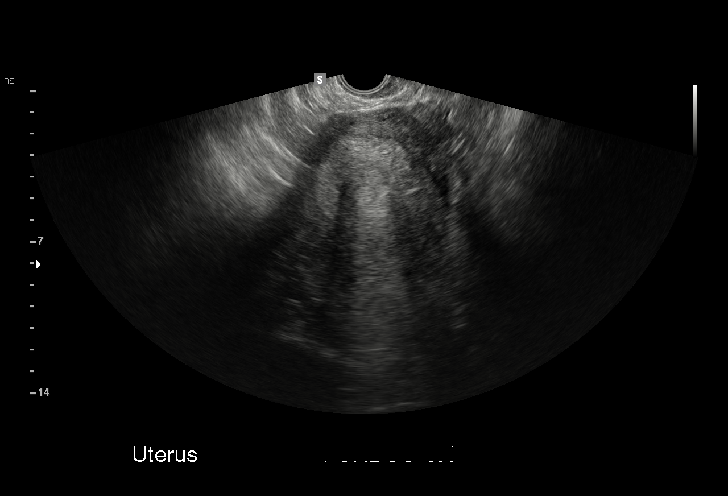
[im 62/79]
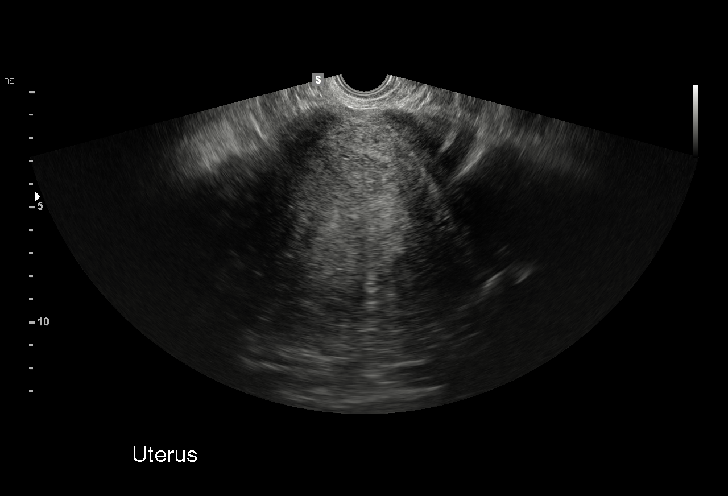
[im 66/79]
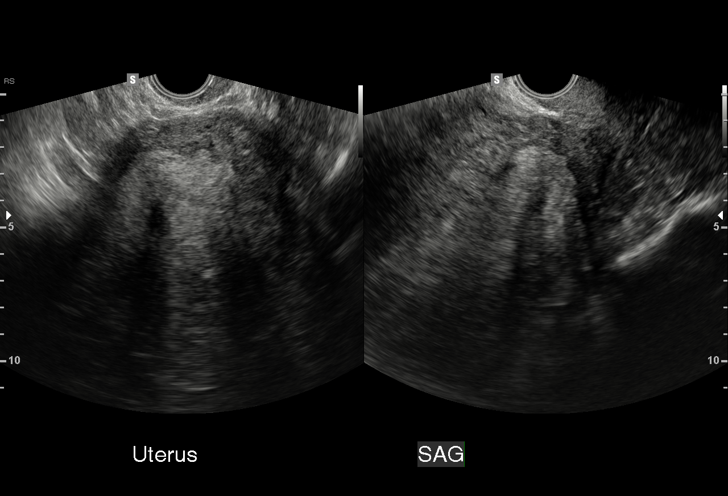
[im 72/79]
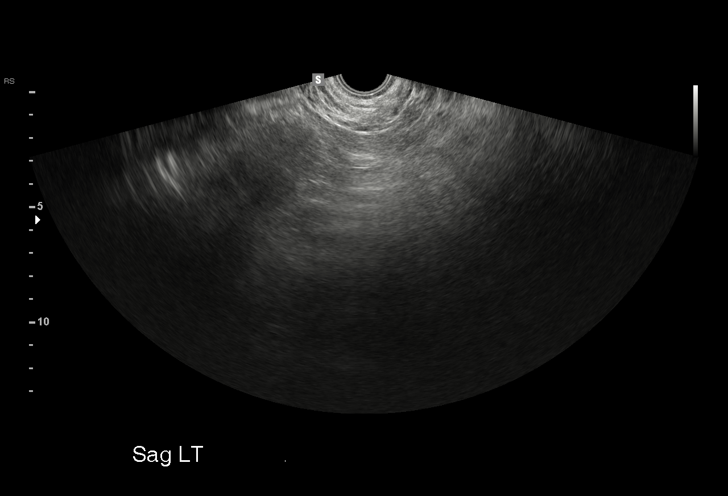
[im 79/79]
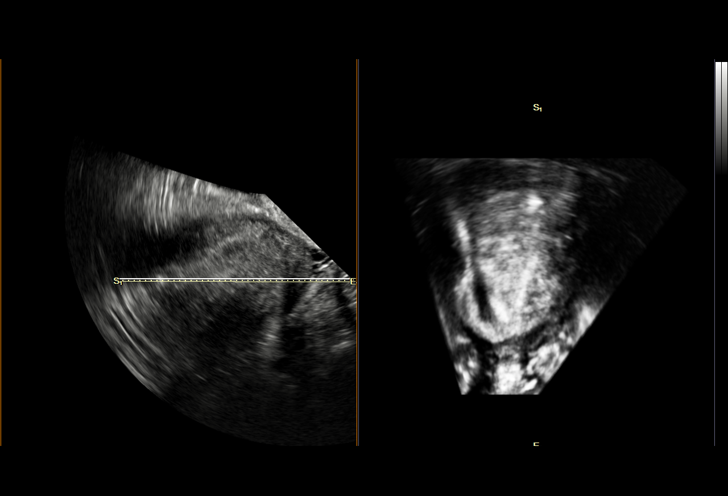

[15 of 25 positions shown; findings below may reference images not displayed]

FINDINGS: Uterus

Measurements: 16.6 x 9.7 x 10.4 cm. Diffusely heterogeneous
myometrial echotexture, however no distinct fibroids identified.

Endometrium

Thickness: 10 mm in fundal region. However, there appears to be more
focal thickening of the endometrium in the lower uterine segment
measuring approximately 26 mm on sagittal cine clip. Focal
endometrial polyp or mass cannot be excluded.

Right ovary

Measurements: 2.6 x 1.2 x 2.0 cm. Normal appearance/no adnexal mass.

Left ovary

Measurements: 2.8 x 1.6 x 2.2 cm. Normal appearance/no adnexal mass.

Other findings

No abnormal free fluid.
IMPRESSION: Enlarged uterus with diffusely heterogeneous myometrium, but no
distinct fibroids. Adenomyosis cannot be excluded.

Question focal endometrial thickening or mass in the lower uterine
segment. Consider further evaluation with follow-up transvaginal
pelvic ultrasound in 6-12 weeks in the week immediately following
menses, or pelvic MRI without and with contrast.

## 2017-11-09 ENCOUNTER — Encounter (HOSPITAL_COMMUNITY): Payer: Self-pay | Admitting: Family Medicine

## 2017-11-09 ENCOUNTER — Ambulatory Visit (HOSPITAL_COMMUNITY)
Admission: EM | Admit: 2017-11-09 | Discharge: 2017-11-09 | Disposition: A | Discharge status: Home / Self Care | Payer: BLUE CROSS/BLUE SHIELD | Attending: Family Medicine | Admitting: Family Medicine

## 2017-11-09 ENCOUNTER — Other Ambulatory Visit: Payer: Self-pay

## 2017-11-09 DIAGNOSIS — Z7984 Long term (current) use of oral hypoglycemic drugs: Secondary | ICD-10-CM | POA: Insufficient documentation

## 2017-11-09 DIAGNOSIS — E119 Type 2 diabetes mellitus without complications: Secondary | ICD-10-CM | POA: Diagnosis not present

## 2017-11-09 DIAGNOSIS — R350 Frequency of micturition: Secondary | ICD-10-CM

## 2017-11-09 DIAGNOSIS — Z79899 Other long term (current) drug therapy: Secondary | ICD-10-CM | POA: Diagnosis not present

## 2017-11-09 DIAGNOSIS — I10 Essential (primary) hypertension: Secondary | ICD-10-CM | POA: Insufficient documentation

## 2017-11-09 DIAGNOSIS — Z885 Allergy status to narcotic agent status: Secondary | ICD-10-CM | POA: Insufficient documentation

## 2017-11-09 DIAGNOSIS — Z882 Allergy status to sulfonamides status: Secondary | ICD-10-CM | POA: Insufficient documentation

## 2017-11-09 LAB — POCT URINALYSIS DIP (DEVICE)
Glucose, UA: NEGATIVE mg/dL
LEUKOCYTES UA: NEGATIVE
NITRITE: NEGATIVE
Protein, ur: NEGATIVE mg/dL
Specific Gravity, Urine: >=1.03 (ref 1.005–1.030)
Urobilinogen, UA: 0.2 mg/dL (ref 0.0–1.0)
pH: 5.5 (ref 5.0–8.0)

## 2017-11-09 MED ORDER — CEPHALEXIN 500 MG PO CAPS
500.0000 mg | ORAL_CAPSULE | Freq: Four times a day (QID) | ORAL | 0 refills | Status: DC
Start: 2017-11-09 — End: 2018-06-11

## 2017-11-09 NOTE — ED Triage Notes (Signed)
Patent presents to [facility] for possible UTI x2 weeks, pt complains of pressure during urination, and frequent urination and dull lower back pain. Pt has taken pain medication to treat symptoms

## 2017-11-09 NOTE — Discharge Instructions (Addendum)
We are running a urinary culture and this should be completed in 2 days.  In the meantime please start the antibiotic that was prescribed.  If the urine culture is negative, we may want you to get further testing.

## 2017-11-09 NOTE — ED Provider Notes (Signed)
[facility]   [id number] 11/09/17 Arrival Time: 1908   SUBJECTIVE:  [name] is a 47 y.o. female who presents to the urgent care with complaint of bladder pressure with frequency and dull LBP for 2 weeks.   Patient has diabetes but her blood sugars have been running about 120 in the morning and 180 in the afternoon.  Past Medical History:  Diagnosis Date  . Anemia   . Diabetes mellitus    Type 2  . Fibroid   . Hypertension   . Infection    UTI  . Vaginal delivery [phone]  . Vaginal Pap smear, abnormal    Family History  Problem Relation Age of Onset  . Hypertension Mother   . Diabetes Father   . Hypertension Father   . Hypertension Maternal Grandmother   . Hypertension Maternal Grandfather   . Hypertension Paternal Grandmother   . Hypertension Paternal Grandfather    Social History   Socioeconomic History  . Marital status: Married    Spouse name: Not on file  . Number of children: Not on file  . Years of education: Not on file  . Highest education level: Not on file  Occupational History  . Not on file  Social Needs  . Financial resource strain: Not on file  . Food insecurity:    Worry: Not on file    Inability: Not on file  . Transportation needs:    Medical: Not on file    Non-medical: Not on file  Tobacco Use  . Smoking status: Never Smoker  . Smokeless tobacco: Never Used  Substance and Sexual Activity  . Alcohol use: No  . Drug use: No  . Sexual activity: Yes    Birth control/protection: Post-menopausal  Lifestyle  . Physical activity:    Days per week: Not on file    Minutes per session: Not on file  . Stress: Not on file  Relationships  . Social connections:    Talks on phone: Not on file    Gets together: Not on file    Attends religious service: Not on file    Active member of club or organization: Not on file    Attends meetings of clubs or organizations: Not on file    Relationship status: Not on file  .  Intimate partner violence:    Fear of current or ex partner: Not on file    Emotionally abused: Not on file    Physically abused: Not on file    Forced sexual activity: Not on file  Other Topics Concern  . Not on file  Social History Narrative  . Not on file   Current Meds  Medication Sig  . glimepiride (AMARYL) 4 MG tablet Take 1 tablet (4 mg total) by mouth daily with breakfast.  . lisinopril (PRINIVIL,ZESTRIL) 5 MG tablet Take 5 mg by mouth daily.  . sitaGLIPtin-metformin (JANUMET) 50-1000 MG per tablet Take 1 tablet by mouth 2 (two) times daily with a meal.   Allergies  Allergen Reactions  . Bactrim [Sulfamethoxazole-Trimethoprim] Hives  . Percocet [Oxycodone-Acetaminophen] Itching      ROS: As per HPI, remainder of ROS negative.   OBJECTIVE:   Vitals:   11/09/17 1941  BP: 121/87  Pulse: 100  Resp: 18  Temp: 97.9 F (36.6 C)  TempSrc: Oral  SpO2: 98%     General appearance: alert; no distress Eyes: PERRL; EOMI; conjunctiva normal HENT: normocephalic; atraumatic; TMs normal, canal normal, external ears normal without trauma; nasal  mucosa normal; oral mucosa normal Neck: supple Abdomen: soft, non-tender; bowel sounds normal; no masses or organomegaly; no guarding or rebound tenderness Back: no CVA tenderness Extremities: no cyanosis or edema; symmetrical with no gross deformities Skin: warm and dry Neurologic: normal gait; grossly normal Psychological: alert and cooperative; normal mood and affect      Labs:  Results for orders placed or performed during the hospital encounter of 11/09/17  POCT urinalysis dip (device)  Result Value Ref Range   Glucose, UA NEGATIVE NEGATIVE mg/dL   Bilirubin Urine SMALL (A) NEGATIVE   Ketones, ur TRACE (A) NEGATIVE mg/dL   Specific Gravity, Urine >=1.030 1.005 - 1.030   Hgb urine dipstick MODERATE (A) NEGATIVE   pH 5.5 5.0 - 8.0   Protein, ur NEGATIVE NEGATIVE mg/dL   Urobilinogen, UA 0.2 0.0 - 1.0 mg/dL   Nitrite  NEGATIVE NEGATIVE   Leukocytes, UA NEGATIVE NEGATIVE    Labs Reviewed  POCT URINALYSIS DIP (DEVICE) - Abnormal; Notable for the following components:      Result Value   Bilirubin Urine SMALL (*)    Ketones, ur TRACE (*)    Hgb urine dipstick MODERATE (*)    All other components within normal limits  URINE CULTURE    No results found.     ASSESSMENT & PLAN:  1. Frequency of micturition     Meds ordered this encounter  Medications  . cephALEXin (KEFLEX) 500 MG capsule    Sig: Take 1 capsule (500 mg total) by mouth 4 (four) times daily.    Dispense:  20 capsule    Refill:  0    Reviewed expectations re: course of current medical issues. Questions answered. Outlined signs and symptoms indicating need for more acute intervention. Patient verbalized understanding. After Visit Summary given.    Procedures:      Robyn Haber, MD 11/09/17 1958

## 2017-11-09 NOTE — ED Notes (Signed)
Pt discharged by provider.

## 2017-11-11 LAB — URINE CULTURE

## 2017-11-13 ENCOUNTER — Telehealth (HOSPITAL_COMMUNITY): Payer: Self-pay

## 2017-11-13 NOTE — Telephone Encounter (Signed)
Urine culture does not clearly demonstrate a UTI.  Pt contacted and made aware.Other possible causes of urinary discomfort include chafing; irritation from hygiene product; other pelvic infection (yeast, bacterial vaginosis) or STD; occasional dietary cause (caffeine); bowel issue (constipation); low estrogen effect; kidney stone passage; or interstitial cystitis.  Attempted to reach patient to go over results. No answer at this time.

## 2018-06-11 ENCOUNTER — Encounter (HOSPITAL_COMMUNITY): Payer: Self-pay | Admitting: *Deleted

## 2018-06-11 ENCOUNTER — Other Ambulatory Visit: Payer: Self-pay

## 2018-06-11 ENCOUNTER — Ambulatory Visit (HOSPITAL_COMMUNITY)
Admission: EM | Admit: 2018-06-11 | Discharge: 2018-06-11 | Disposition: A | Discharge status: Home / Self Care | Payer: BLUE CROSS/BLUE SHIELD | Attending: Internal Medicine | Admitting: Internal Medicine

## 2018-06-11 DIAGNOSIS — N3090 Cystitis, unspecified without hematuria: Secondary | ICD-10-CM | POA: Insufficient documentation

## 2018-06-11 MED ORDER — CEPHALEXIN 500 MG PO CAPS: 500.0000 mg | ORAL_CAPSULE | Freq: Four times a day (QID) | ORAL | 0 refills | Status: DC

## 2018-06-11 MED ORDER — FLUCONAZOLE 150 MG PO TABS: 150.0000 mg | ORAL_TABLET | Freq: Every day | ORAL | 1 refills | Status: DC

## 2018-06-11 NOTE — ED Provider Notes (Signed)
[facility]    CSN: [id number] Arrival date & time: 06/11/18  1739     History   Chief Complaint Chief Complaint  Patient presents with  . Urinary Tract Infection    HPI [name] is a 47 y.o. female.   47 year old female with diabetes reports dysuria and some vague right flank pain x2 days.  She denies fevers but admits to nausea.  She denies vomiting or diarrhea.  She states that this is how she feels prior to developing a urinary tract infection.  She says the latter occurs when her diet is out of control or she is under a lot of stress which is been the case lately.     Past Medical History:  Diagnosis Date  . Anemia   . Diabetes mellitus    Type 2  . Fibroid   . Hypertension   . Infection    UTI  . Vaginal delivery [phone]  . Vaginal Pap smear, abnormal     Patient Active Problem List   Diagnosis Date Noted  . Fibroids 12/22/2015  . Post-operative state 12/22/2015  . DUB (dysfunctional uterine bleeding) 10/12/2015  . Obesity 03/01/2012  . Diabetes mellitus ([place]) 10/26/2011    Past Surgical History:  Procedure Laterality Date  . ABDOMINAL HYSTERECTOMY    . KNEE SURGERY     x 3  . ROBOTIC ASSISTED TOTAL HYSTERECTOMY WITH SALPINGECTOMY Bilateral 12/22/2015   Procedure: ROBOTIC ASSISTED TOTAL HYSTERECTOMY WITH BILATERAL SALPINGECTOMY;  Surgeon: Lavonia Drafts, MD;  Location: [facility];  Service: Gynecology;  Laterality: Bilateral;    OB History    Gravida  3   Para  3   Term  3   Preterm      AB      Living  3     SAB      TAB      Ectopic      Multiple      Live Births               Home Medications    Prior to Admission medications   Medication Sig Start Date End Date Taking? Authorizing Provider  carbamazepine (TEGRETOL) 200 MG tablet 200 mg day 1, 200 mg twice daily for day 2 and 3, 400 mg twice daily for day 3 and 4. Use 400 mg twice daily, as needed, on days 5 through 7. 02/28/16   Joy, Shawn  C, PA-C  cephALEXin (KEFLEX) 500 MG capsule Take 1 capsule (500 mg total) by mouth 4 (four) times daily. 06/11/18   Harrie Foreman, MD  fluconazole (DIFLUCAN) 150 MG tablet Take 1 tablet (150 mg total) by mouth daily. Take 1 tablet at the completion of Keflex. If symptoms persist in 3 days take the other tablet 06/11/18   Harrie Foreman, MD  glimepiride (AMARYL) 4 MG tablet Take 1 tablet (4 mg total) by mouth daily with breakfast. 12/23/15   Lavonia Drafts, MD  ibuprofen (ADVIL,MOTRIN) 600 MG tablet Take 1 tablet (600 mg total) by mouth every 6 (six) hours as needed (mild pain). 12/23/15   Lavonia Drafts, MD  lisinopril (PRINIVIL,ZESTRIL) 5 MG tablet Take 5 mg by mouth daily.    [provider]  montelukast (SINGULAIR) 10 MG tablet Take 10 mg by mouth at bedtime. 02/24/16   [provider]  naproxen (NAPROSYN) 500 MG tablet Take 1 tablet (500 mg total) by mouth 2 (two) times daily. 02/28/16   Joy, Shawn C, PA-C  sitaGLIPtin-metformin (JANUMET)  50-1000 MG per tablet Take 1 tablet by mouth 2 (two) times daily with a meal.    [provider]  traMADol (ULTRAM) 50 MG tablet Take 1 tablet (50 mg total) by mouth every 6 (six) hours as needed for moderate pain. Patient not taking: Reported on 02/05/2016 12/23/15   Lavonia Drafts, MD    Family History Family History  Problem Relation Age of Onset  . Hypertension Mother   . Diabetes Father   . Hypertension Father   . Hypertension Maternal Grandmother   . Hypertension Maternal Grandfather   . Hypertension Paternal Grandmother   . Hypertension Paternal Grandfather     Social History Social History   Tobacco Use  . Smoking status: Never Smoker  . Smokeless tobacco: Never Used  Substance Use Topics  . Alcohol use: No  . Drug use: No     Allergies   Bactrim [sulfamethoxazole-trimethoprim] and Percocet [oxycodone-acetaminophen]   Review of Systems Review of Systems  Constitutional:  Negative for chills and fever.  HENT: Negative for sore throat and tinnitus.   Eyes: Negative for redness.  Respiratory: Negative for cough and shortness of breath.   Cardiovascular: Negative for chest pain and palpitations.  Gastrointestinal: Negative for abdominal pain, diarrhea, nausea and vomiting.  Genitourinary: Positive for dysuria. Negative for frequency and urgency.  Musculoskeletal: Negative for myalgias.  Skin: Negative for rash.       No lesions  Neurological: Negative for weakness.  Hematological: Does not bruise/bleed easily.  Psychiatric/Behavioral: Negative for suicidal ideas.     Physical Exam Triage Vital Signs ED Triage Vitals  Enc Vitals Group     BP 06/11/18 1815 121/66     Pulse Rate 06/11/18 1815 97     Resp 06/11/18 1815 18     Temp 06/11/18 1815 97.9 F (36.6 C)     Temp Source 06/11/18 1815 Oral     SpO2 06/11/18 1815 97 %     Weight --      Height --      Head Circumference --      Peak Flow --      Pain Score 06/11/18 1817 0     Pain Loc --      Pain Edu? --      Excl. in [place]? --    No data found.  Updated Vital Signs BP 121/66 (BP Location: Right Arm)   Pulse 97   Temp 97.9 F (36.6 C) (Oral)   Resp 18   LMP 10/02/2015   SpO2 97%   Visual Acuity Right Eye Distance:   Left Eye Distance:   Bilateral Distance:    Right Eye Near:   Left Eye Near:    Bilateral Near:     Physical Exam Vitals signs and nursing note reviewed.  Constitutional:      General: She is not in acute distress.    Appearance: She is well-developed.  HENT:     Head: Normocephalic and atraumatic.  Eyes:     General: No scleral icterus.    Conjunctiva/sclera: Conjunctivae normal.     Pupils: Pupils are equal, round, and reactive to light.  Neck:     Musculoskeletal: Normal range of motion and neck supple.     Thyroid: No thyromegaly.     Vascular: No JVD.     Trachea: No tracheal deviation.  Cardiovascular:     Rate and Rhythm: Normal rate and regular  rhythm.     Heart sounds: Normal heart sounds. No murmur.  No friction rub. No gallop.   Pulmonary:     Effort: Pulmonary effort is normal.     Breath sounds: Normal breath sounds.  Abdominal:     General: Bowel sounds are normal. There is no distension.     Palpations: Abdomen is soft.     Tenderness: There is no abdominal tenderness.  Musculoskeletal: Normal range of motion.  Lymphadenopathy:     Cervical: No cervical adenopathy.  Skin:    General: Skin is warm and dry.  Neurological:     Mental Status: She is alert and oriented to person, place, and time.     Cranial Nerves: No cranial nerve deficit.  Psychiatric:        Behavior: Behavior normal.        Thought Content: Thought content normal.        Judgment: Judgment normal.      UC Treatments / Results  Labs (all labs ordered are listed, but only abnormal results are displayed) Labs Reviewed - No data to display  EKG None  Radiology No results found.  Procedures Procedures (including critical care time)  Medications Ordered in UC Medications - No data to display  Initial Impression / Assessment and Plan / UC Course  I have reviewed the triage vital signs and the nursing notes.  Pertinent labs & imaging results that were available during my care of the patient were reviewed by me and considered in my medical decision making (see chart for details).     UA never resulted.  Will treat empirically based on previous symptoms.  Final Clinical Impressions(s) / UC Diagnoses   Final diagnoses:  Cystitis   Discharge Instructions   None    ED Prescriptions    Medication Sig Dispense Auth. Provider   fluconazole (DIFLUCAN) 150 MG tablet Take 1 tablet (150 mg total) by mouth daily. Take 1 tablet at the completion of Keflex. If symptoms persist in 3 days take the other tablet 2 tablet Harrie Foreman, MD   cephALEXin (KEFLEX) 500 MG capsule Take 1 capsule (500 mg total) by mouth 4 (four) times daily. 20  capsule Harrie Foreman, MD     Controlled Substance Prescriptions [place] Controlled Substance Registry consulted? Not Applicable   Harrie Foreman, MD 06/11/18 2000

## 2018-06-11 NOTE — ED Triage Notes (Signed)
States she is a diabetic and over the holidays she didn't eat right and states when she does that sometimes she has vaginal irritation , states she thinks she has a UTI

## 2019-02-11 ENCOUNTER — Encounter: Payer: Self-pay | Admitting: Emergency Medicine

## 2019-02-11 ENCOUNTER — Ambulatory Visit
Admission: EM | Admit: 2019-02-11 | Discharge: 2019-02-11 | Disposition: A | Discharge status: Home / Self Care | Payer: BC Managed Care – PPO

## 2019-02-11 ENCOUNTER — Other Ambulatory Visit: Payer: Self-pay

## 2019-02-11 DIAGNOSIS — R6889 Other general symptoms and signs: Secondary | ICD-10-CM | POA: Diagnosis not present

## 2019-02-11 DIAGNOSIS — Z20822 Contact with and (suspected) exposure to covid-19: Secondary | ICD-10-CM

## 2019-02-11 MED ORDER — BENZONATATE 200 MG PO CAPS: 200.0000 mg | ORAL_CAPSULE | Freq: Three times a day (TID) | ORAL | 0 refills | Status: DC

## 2019-02-11 NOTE — ED Provider Notes (Signed)
[name]    CSN: [id number] Arrival date & time: 02/11/19  1732     History   Chief Complaint Chief Complaint  Patient presents with  . Cough    HPI [name] is a 48 y.o. female.   48 year old female with history of diabetes, hypertension comes in for 4-day history of URI symptoms.  She has had rhinorrhea, nasal congestion, cough.  Has had subjective fever, chills, body aches.  Denies abdominal pain, nausea, vomiting, diarrhea.  Denies loss of taste or smell.  Denies shortness of breath, wheezing.  Husband with similar symptoms.  She is working from home right now, no obvious COVID contact.  Took antipyretics shortly prior to arrival.  Never smoker.  Patient was tachycardic at triage, denies weakness, dizziness, syncope.  Denies chest pain.     Past Medical History:  Diagnosis Date  . Anemia   . Diabetes mellitus    Type 2  . Fibroid   . Hypertension   . Infection    UTI  . Vaginal delivery [phone]  . Vaginal Pap smear, abnormal     Patient Active Problem List   Diagnosis Date Noted  . Fibroids 12/22/2015  . Post-operative state 12/22/2015  . DUB (dysfunctional uterine bleeding) 10/12/2015  . Obesity 03/01/2012  . Diabetes mellitus ([place]) 10/26/2011    Past Surgical History:  Procedure Laterality Date  . ABDOMINAL HYSTERECTOMY    . KNEE SURGERY     x 3  . ROBOTIC ASSISTED TOTAL HYSTERECTOMY WITH SALPINGECTOMY Bilateral 12/22/2015   Procedure: ROBOTIC ASSISTED TOTAL HYSTERECTOMY WITH BILATERAL SALPINGECTOMY;  Surgeon: Lavonia Drafts, MD;  Location: [facility];  Service: Gynecology;  Laterality: Bilateral;    OB History    Gravida  3   Para  3   Term  3   Preterm      AB      Living  3     SAB      TAB      Ectopic      Multiple      Live Births               Home Medications    Prior to Admission medications   Medication Sig Start Date End Date Taking? Authorizing Provider  liraglutide  (VICTOZA) 18 MG/3ML SOPN Inject 1.2 mg into the skin.   Yes [provider]  losartan (COZAAR) 50 MG tablet Take 50 mg by mouth daily.   Yes [provider]  metFORMIN (GLUCOPHAGE) 1000 MG tablet Take 1,000 mg by mouth 2 (two) times daily with a meal.   Yes [provider]  benzonatate (TESSALON) 200 MG capsule Take 1 capsule (200 mg total) by mouth every 8 (eight) hours. 02/11/19   Tasia Catchings, Kayvan Hoefling V, PA-C  ibuprofen (ADVIL,MOTRIN) 600 MG tablet Take 1 tablet (600 mg total) by mouth every 6 (six) hours as needed (mild pain). 12/23/15   Lavonia Drafts, MD  montelukast (SINGULAIR) 10 MG tablet Take 10 mg by mouth at bedtime. 02/24/16   [provider]  naproxen (NAPROSYN) 500 MG tablet Take 1 tablet (500 mg total) by mouth 2 (two) times daily. 02/28/16   Joy, Shawn C, PA-C  traMADol (ULTRAM) 50 MG tablet Take 1 tablet (50 mg total) by mouth every 6 (six) hours as needed for moderate pain. Patient not taking: Reported on 02/05/2016 12/23/15   Lavonia Drafts, MD  carbamazepine (TEGRETOL) 200 MG tablet 200 mg day 1, 200 mg twice daily for day 2  and 3, 400 mg twice daily for day 3 and 4. Use 400 mg twice daily, as needed, on days 5 through 7. 02/28/16 02/11/19  Joy, Shawn C, PA-C  glimepiride (AMARYL) 4 MG tablet Take 1 tablet (4 mg total) by mouth daily with breakfast. 12/23/15 02/11/19  Lavonia Drafts, MD  lisinopril (PRINIVIL,ZESTRIL) 5 MG tablet Take 5 mg by mouth daily.  02/11/19  [provider]  sitaGLIPtin-metformin (JANUMET) 50-1000 MG per tablet Take 1 tablet by mouth 2 (two) times daily with a meal.  02/11/19  [provider]    Family History Family History  Problem Relation Age of Onset  . Hypertension Mother   . Diabetes Father   . Hypertension Father   . Hypertension Maternal Grandmother   . Hypertension Maternal Grandfather   . Hypertension Paternal Grandmother   . Hypertension Paternal Grandfather     Social History  Social History   Tobacco Use  . Smoking status: Never Smoker  . Smokeless tobacco: Never Used  Substance Use Topics  . Alcohol use: No  . Drug use: No     Allergies   Bactrim [sulfamethoxazole-trimethoprim] and Percocet [oxycodone-acetaminophen]   Review of Systems Review of Systems  Reason unable to perform ROS: See HPI as above.     Physical Exam Triage Vital Signs ED Triage Vitals  Enc Vitals Group     BP      Pulse      Resp      Temp      Temp src      SpO2      Weight      Height      Head Circumference      Peak Flow      Pain Score      Pain Loc      Pain Edu?      Excl. in [place]?    No data found.  Updated Vital Signs BP 107/78 (BP Location: Right Arm)   Pulse (!) 122   Temp 98.5 F (36.9 C) (Oral)   Resp 18   LMP 10/02/2015   SpO2 94%   Physical Exam Constitutional:      General: She is not in acute distress.    Appearance: Normal appearance. She is not ill-appearing, toxic-appearing or diaphoretic.  HENT:     Head: Normocephalic and atraumatic.     Mouth/Throat:     Mouth: Mucous membranes are moist.     Pharynx: Oropharynx is clear. Uvula midline.  Neck:     Musculoskeletal: Normal range of motion and neck supple.  Cardiovascular:     Rate and Rhythm: Regular rhythm. Tachycardia present.     Heart sounds: Normal heart sounds. No murmur. No friction rub. No gallop.   Pulmonary:     Effort: Pulmonary effort is normal. No accessory muscle usage, prolonged expiration, respiratory distress or retractions.     Comments: Lungs clear to auscultation without adventitious lung sounds. Neurological:     General: No focal deficit present.     Mental Status: She is alert and oriented to person, place, and time.     Comments: Able to ambulate on own without difficulty.      UC Treatments / Results  Labs (all labs ordered are listed, but only abnormal results are displayed) Labs Reviewed - No data to display  EKG   Radiology No results  found.  Procedures Procedures (including critical care time)  Medications Ordered in UC Medications - No data to display  Initial Impression / Assessment and Plan / UC Course  I have reviewed the triage vital signs and the nursing notes.  Pertinent labs & imaging results that were available during my care of the patient were reviewed by me and considered in my medical decision making (see chart for details).    COVID testing ordered.  Patient with tachycardia, O2 saturation fluctuating from 92 to 97% on room air.  She denies shortness of breath, chest pain.  Lungs clear to auscultation bilaterally without adventitious lung sounds.  She is able to speak in full sentences without difficulty.  Will have patient push fluids, start symptomatic treatment.  Strict return precautions given.  Patient expresses understanding and agrees to plan.  Final Clinical Impressions(s) / UC Diagnoses   Final diagnoses:  Suspected Covid-19 Virus Infection   ED Prescriptions    Medication Sig Dispense Auth. Provider   benzonatate (TESSALON) 200 MG capsule Take 1 capsule (200 mg total) by mouth every 8 (eight) hours. 21 capsule Tobin Chad, [place] 02/11/19 1828

## 2019-02-11 NOTE — Discharge Instructions (Addendum)
As discussed, cannot rule out COVID. Currently, no alarming signs. Testing ordered. I would like you to quarantine until testing results. Tessalon for cough. If experiencing shortness of breath, trouble breathing, call 911 and provide them with your current situation.

## 2019-02-11 NOTE — ED Triage Notes (Signed)
Pt here for cough

## 2019-02-13 LAB — NOVEL CORONAVIRUS, NAA: SARS-CoV-2, NAA: DETECTED — AB

## 2019-02-14 ENCOUNTER — Encounter (HOSPITAL_COMMUNITY): Payer: Self-pay

## 2019-02-14 ENCOUNTER — Telehealth (HOSPITAL_COMMUNITY): Payer: Self-pay | Admitting: Emergency Medicine

## 2019-02-14 NOTE — Telephone Encounter (Signed)
Your test for COVID-19 was positive, meaning that you were infected with the novel coronavirus and could give the germ to others.  Please continue isolation at home, for at least 10 days since the start of your fever/cough/breathlessness and until you have had 3 consecutive days without fever (without taking a fever reducer) and with cough/breathlessness improving. Please continue good preventive care measures, including:  frequent hand-washing, avoid touching your face, cover coughs/sneezes, stay out of crowds and keep a 6 foot distance from others.  Recheck or go to the nearest hospital ED tent for re-assessment if fever/cough/breathlessness return.  Patient contacted and made aware of    results, all questions answered  mychart info given, will send notes once activated

## 2019-02-15 ENCOUNTER — Telehealth: Payer: Self-pay | Admitting: Emergency Medicine

## 2019-02-15 NOTE — Telephone Encounter (Signed)
APP received FMLA paperwork from patient via fax.  This RN spoke with patient and made her aware [facility] unable to fill out this kind of paperwork, and recommended she follow up with a PCP.  Patient is working from home, and this RN also made her aware that if she is able to stay quarantined, that the provider states its okay for her to continue working.  Patient states she is too fatigued to work.  This RN reiterated need to follow up with PCP for paperwork, and patient verbalized understanding.

## 2019-03-11 NOTE — Progress Notes (Deleted)
   Subjective:    Patient ID: [name], female    DOB: 1971/03/02, 48 y.o.   MRN: [id number]  Telephone visit cough Cough      Review of Systems  Respiratory: Positive for cough.        Objective:         Assessment & Plan:  I personally reviewed all images and lab data in the [facility] system as well as any outside material available during this office visit and agree with the  radiology impressions.   No problem-specific Assessment & Plan notes found for this encounter.   There are no diagnoses linked to this encounter.

## 2019-03-12 ENCOUNTER — Inpatient Hospital Stay: Payer: BC Managed Care – PPO

## 2019-09-02 ENCOUNTER — Ambulatory Visit
Admission: EM | Admit: 2019-09-02 | Discharge: 2019-09-02 | Disposition: A | Discharge status: Home / Self Care | Payer: 59 | Attending: Emergency Medicine | Admitting: Emergency Medicine

## 2019-09-02 DIAGNOSIS — E1165 Type 2 diabetes mellitus with hyperglycemia: Secondary | ICD-10-CM | POA: Diagnosis not present

## 2019-09-02 LAB — POCT URINALYSIS DIP (MANUAL ENTRY)
Bilirubin, UA: NEGATIVE
Glucose, UA: NEGATIVE mg/dL
Ketones, POC UA: NEGATIVE mg/dL
Leukocytes, UA: NEGATIVE
Nitrite, UA: NEGATIVE
Protein Ur, POC: NEGATIVE mg/dL
Spec Grav, UA: >=1.03 — AB (ref 1.010–1.025)
Urobilinogen, UA: 0.2 E.U./dL
pH, UA: 5.5 (ref 5.0–8.0)

## 2019-09-02 LAB — POCT FASTING CBG KUC MANUAL ENTRY: POCT Glucose (KUC): 200 mg/dL — AB (ref 70–99)

## 2019-09-02 NOTE — ED Triage Notes (Signed)
Pt c/o urinary frequency since Saturday and also states her glucose has been dropping as low as 43. No distress at this time.

## 2019-09-02 NOTE — Discharge Instructions (Addendum)
Talk to PCP about sugars and possible Endocrinology referral. Keep sugar log daily. Continue working on Copy dietary changes daily and drinking more water. Return for worsening urinary symptoms, back/belly pain, fever, blood in urine, nausea/vomiting.

## 2019-09-02 NOTE — ED Provider Notes (Signed)
[name]    CSN: [id number] Arrival date & time: 09/02/19  1730      History   Chief Complaint Chief Complaint  Patient presents with  . Urinary Frequency    HPI [name] is a 49 y.o. female with history of HTN, T2DM, obesity presenting for sugar check.  States her readings have been lower than usual for the last few days.  Last sugar check today was 71 at 3:30pm, ate at 4 (grapes).  Lowest sugar reading was 43 on Sunday around 3pm before eating.  States at that time she felt "a little tired, but that's it".  Has been trying to eat more sugary foods to bring her sugars up since low readings.  Endorsing urinary frequency (without urgency, hematuria, abdominal/back pain or fever) since today.  Denied nausea, vomiting, chest pain, shortness of breath, headache.    Past Medical History:  Diagnosis Date  . Anemia   . Diabetes mellitus    Type 2  . Fibroid   . Hypertension   . Infection    UTI  . Vaginal delivery [phone]  . Vaginal Pap smear, abnormal     Patient Active Problem List   Diagnosis Date Noted  . Fibroids 12/22/2015  . Post-operative state 12/22/2015  . DUB (dysfunctional uterine bleeding) 10/12/2015  . Obesity 03/01/2012  . Diabetes mellitus ([place]) 10/26/2011    Past Surgical History:  Procedure Laterality Date  . ABDOMINAL HYSTERECTOMY    . KNEE SURGERY     x 3  . ROBOTIC ASSISTED TOTAL HYSTERECTOMY WITH SALPINGECTOMY Bilateral 12/22/2015   Procedure: ROBOTIC ASSISTED TOTAL HYSTERECTOMY WITH BILATERAL SALPINGECTOMY;  Surgeon: Lavonia Drafts, MD;  Location: [facility];  Service: Gynecology;  Laterality: Bilateral;    OB History    Gravida  3   Para  3   Term  3   Preterm      AB      Living  3     SAB      TAB      Ectopic      Multiple      Live Births               Home Medications    Prior to Admission medications   Medication Sig Start Date End Date Taking? Authorizing Provider    liraglutide (VICTOZA) 18 MG/3ML SOPN Inject 1.2 mg into the skin.    [provider]  losartan (COZAAR) 50 MG tablet Take 50 mg by mouth daily.    [provider]  metFORMIN (GLUCOPHAGE) 1000 MG tablet Take 1,000 mg by mouth 2 (two) times daily with a meal.    [provider]  carbamazepine (TEGRETOL) 200 MG tablet 200 mg day 1, 200 mg twice daily for day 2 and 3, 400 mg twice daily for day 3 and 4. Use 400 mg twice daily, as needed, on days 5 through 7. 02/28/16 02/11/19  Joy, Shawn C, PA-C  glimepiride (AMARYL) 4 MG tablet Take 1 tablet (4 mg total) by mouth daily with breakfast. 12/23/15 02/11/19  Lavonia Drafts, MD  lisinopril (PRINIVIL,ZESTRIL) 5 MG tablet Take 5 mg by mouth daily.  02/11/19  [provider]  sitaGLIPtin-metformin (JANUMET) 50-1000 MG per tablet Take 1 tablet by mouth 2 (two) times daily with a meal.  02/11/19  [provider]    Family History Family History  Problem Relation Age of Onset  . Hypertension Mother   . Diabetes Father   . Hypertension  Father   . Hypertension Maternal Grandmother   . Hypertension Maternal Grandfather   . Hypertension Paternal Grandmother   . Hypertension Paternal Grandfather     Social History Social History   Tobacco Use  . Smoking status: Never Smoker  . Smokeless tobacco: Never Used  Substance Use Topics  . Alcohol use: No  . Drug use: No     Allergies   Bactrim [sulfamethoxazole-trimethoprim] and Percocet [oxycodone-acetaminophen]   Review of Systems As per HPI   Physical Exam Triage Vital Signs ED Triage Vitals  Enc Vitals Group     BP      Pulse      Resp      Temp      Temp src      SpO2      Weight      Height      Head Circumference      Peak Flow      Pain Score      Pain Loc      Pain Edu?      Excl. in [place]?    No data found.  Updated Vital Signs BP 139/71 (BP Location: Left Arm)   Pulse (!) 114   Temp 98 F (36.7 C) (Oral)   Resp 18   LMP  10/02/2015   SpO2 98%   Visual Acuity Right Eye Distance:   Left Eye Distance:   Bilateral Distance:    Right Eye Near:   Left Eye Near:    Bilateral Near:     Physical Exam Constitutional:      General: She is not in acute distress.    Appearance: She is obese. She is not ill-appearing or diaphoretic.  HENT:     Head: Normocephalic and atraumatic.     Mouth/Throat:     Mouth: Mucous membranes are moist.     Pharynx: Oropharynx is clear.  Eyes:     General: No scleral icterus.    Pupils: Pupils are equal, round, and reactive to light.  Cardiovascular:     Rate and Rhythm: Regular rhythm. Tachycardia present.     Heart sounds: No murmur. No gallop.      Comments: HR 109-116 at bedside Pulmonary:     Effort: Pulmonary effort is normal. No respiratory distress.     Breath sounds: No wheezing.  Skin:    General: Skin is warm.     Capillary Refill: Capillary refill takes less than 2 seconds.     Coloration: Skin is not jaundiced or pale.  Neurological:     General: No focal deficit present.     Mental Status: She is alert and oriented to person, place, and time.      UC Treatments / Results  Labs (all labs ordered are listed, but only abnormal results are displayed) Labs Reviewed  POCT FASTING CBG [facility] MANUAL ENTRY - Abnormal; Notable for the following components:      Result Value   POCT Glucose ([facility]) 200 (*)    All other components within normal limits  POCT URINALYSIS DIP (MANUAL ENTRY) - Abnormal; Notable for the following components:   Spec Grav, UA >=1.030 (*)    Blood, UA large (*)    All other components within normal limits    EKG   Radiology No results found.  Procedures Procedures (including critical care time)  Medications Ordered in UC Medications - No data to display  Initial Impression / Assessment and Plan / UC Course  I have  reviewed the triage vital signs and the nursing notes.  Pertinent labs & imaging results that were available  during my care of the patient were reviewed by me and considered in my medical decision making (see chart for details).     Patient afebrile, nontoxic in office today.  CBG 200: Patient last ate at 4 PM.  Denying headache, malaise, nausea, vomiting, abdominal pain, chest pain, difficulty breathing.  Urine dipstick showing elevated specific gravity, large blood; otherwise within normal limits.  Low concern for UTI, DKA, HHS at this time.  Low blood sugar readings likely due to glucometer error.  Reviewed sugar and food log, follow-up with PCP for further evaluation/management if needed.  Return precautions discussed, patient verbalized understanding and is agreeable to plan. Final Clinical Impressions(s) / UC Diagnoses   Final diagnoses:  Type 2 diabetes mellitus with hyperglycemia, without long-term current use of insulin [facility])     Discharge Instructions     Talk to PCP about sugars and possible Endocrinology referral. Keep sugar log daily. Continue working on Copy dietary changes daily and drinking more water. Return for worsening urinary symptoms, back/belly pain, fever, blood in urine, nausea/vomiting.    ED Prescriptions    None     PDMP not reviewed this encounter.   Neldon [place], [place] 09/02/19 1817

## 2019-12-25 ENCOUNTER — Ambulatory Visit
Admission: EM | Admit: 2019-12-25 | Discharge: 2019-12-25 | Disposition: A | Discharge status: Home / Self Care | Payer: No Typology Code available for payment source | Attending: Physician Assistant | Admitting: Physician Assistant

## 2019-12-25 ENCOUNTER — Encounter: Payer: Self-pay | Admitting: Emergency Medicine

## 2019-12-25 ENCOUNTER — Other Ambulatory Visit: Payer: Self-pay

## 2019-12-25 DIAGNOSIS — Z760 Encounter for issue of repeat prescription: Secondary | ICD-10-CM | POA: Diagnosis not present

## 2019-12-25 MED ORDER — METFORMIN HCL 1000 MG PO TABS: 1000.0000 mg | ORAL_TABLET | Freq: Two times a day (BID) | ORAL | 0 refills | Status: DC

## 2019-12-25 NOTE — ED Triage Notes (Signed)
Pt states she is here for a refill on her Metformin, which she states will be out this weekend.  States she went by her PCP's office (Dr. Leslie Andrea with [facility]) for a refill and they appeared to be closed.

## 2019-12-25 NOTE — ED Provider Notes (Signed)
[name]    CSN: [id number] Arrival date & time: 12/25/19  1812      History   Chief Complaint Chief Complaint  Patient presents with  . Medication Refill    HPI [name] is a 49 y.o. female.   49 year old female comes in for medication refill. On metformin and victoza for DM. Last a1c ~8, no medication changes at the time, and was told to adjust diet. Patient has been taking medicine as prescribed without missing dosage. Has PCP appointment in next 2-3 weeks, but was going to run out of metformin this weekend and tried to contact PCP office. States PCP was closed and therefore came in for refill.      Past Medical History:  Diagnosis Date  . Anemia   . Diabetes mellitus    Type 2  . Fibroid   . Hypertension   . Infection    UTI  . Vaginal delivery [phone]  . Vaginal Pap smear, abnormal     Patient Active Problem List   Diagnosis Date Noted  . Fibroids 12/22/2015  . Post-operative state 12/22/2015  . DUB (dysfunctional uterine bleeding) 10/12/2015  . Obesity 03/01/2012  . Diabetes mellitus ([place]) 10/26/2011    Past Surgical History:  Procedure Laterality Date  . ABDOMINAL HYSTERECTOMY    . KNEE SURGERY     x 3  . ROBOTIC ASSISTED TOTAL HYSTERECTOMY WITH SALPINGECTOMY Bilateral 12/22/2015   Procedure: ROBOTIC ASSISTED TOTAL HYSTERECTOMY WITH BILATERAL SALPINGECTOMY;  Surgeon: Lavonia Drafts, MD;  Location: [facility];  Service: Gynecology;  Laterality: Bilateral;    OB History    Gravida  3   Para  3   Term  3   Preterm      AB      Living  3     SAB      TAB      Ectopic      Multiple      Live Births               Home Medications    Prior to Admission medications   Medication Sig Start Date End Date Taking? Authorizing Provider  liraglutide (VICTOZA) 18 MG/3ML SOPN Inject 1.2 mg into the skin.    [provider]  losartan (COZAAR) 50 MG tablet Take 50 mg by mouth daily.    [provider]  metFORMIN (GLUCOPHAGE) 1000 MG tablet Take 1 tablet (1,000 mg total) by mouth 2 (two) times daily with a meal. 12/25/19 03/24/20  Tasia Catchings, Lillieann Pavlich V, PA-C  carbamazepine (TEGRETOL) 200 MG tablet 200 mg day 1, 200 mg twice daily for day 2 and 3, 400 mg twice daily for day 3 and 4. Use 400 mg twice daily, as needed, on days 5 through 7. 02/28/16 02/11/19  Joy, Shawn C, PA-C  glimepiride (AMARYL) 4 MG tablet Take 1 tablet (4 mg total) by mouth daily with breakfast. 12/23/15 02/11/19  Lavonia Drafts, MD  lisinopril (PRINIVIL,ZESTRIL) 5 MG tablet Take 5 mg by mouth daily.  02/11/19  [provider]  sitaGLIPtin-metformin (JANUMET) 50-1000 MG per tablet Take 1 tablet by mouth 2 (two) times daily with a meal.  02/11/19  [provider]    Family History Family History  Problem Relation Age of Onset  . Hypertension Mother   . Diabetes Father   . Hypertension Father   . Hypertension Maternal Grandmother   . Hypertension Maternal Grandfather   . Hypertension Paternal Grandmother   .  Hypertension Paternal Grandfather     Social History Social History   Tobacco Use  . Smoking status: Never Smoker  . Smokeless tobacco: Never Used  Substance Use Topics  . Alcohol use: No  . Drug use: No     Allergies   Bactrim [sulfamethoxazole-trimethoprim] and Percocet [oxycodone-acetaminophen]   Review of Systems Review of Systems  Reason unable to perform ROS: See HPI as above.     Physical Exam Triage Vital Signs ED Triage Vitals  Enc Vitals Group     BP 12/25/19 1824 132/90     Pulse Rate 12/25/19 1824 (!) 121     Resp 12/25/19 1824 20     Temp 12/25/19 1824 (!) 97.5 F (36.4 C)     Temp Source 12/25/19 1824 Oral     SpO2 12/25/19 1824 95 %     Weight --      Height --      Head Circumference --      Peak Flow --      Pain Score 12/25/19 1826 0     Pain Loc --      Pain Edu? --      Excl. in [place]? --    No data found.  Updated Vital Signs BP 132/90 (BP  Location: Left Arm)   Pulse (!) 121   Temp (!) 97.5 F (36.4 C) (Oral)   Resp 20   LMP 10/02/2015   SpO2 95%   Visual Acuity Right Eye Distance:   Left Eye Distance:   Bilateral Distance:    Right Eye Near:   Left Eye Near:    Bilateral Near:     Physical Exam Constitutional:      General: She is not in acute distress.    Appearance: Normal appearance. She is well-developed. She is not toxic-appearing or diaphoretic.  HENT:     Head: Normocephalic and atraumatic.  Eyes:     Conjunctiva/sclera: Conjunctivae normal.     Pupils: Pupils are equal, round, and reactive to light.  Cardiovascular:     Rate and Rhythm: Regular rhythm. Tachycardia present.     Heart sounds: No murmur heard.  No friction rub. No gallop.   Pulmonary:     Effort: Pulmonary effort is normal. No respiratory distress.     Comments: Speaking in full sentences without difficulty. LCTAB Musculoskeletal:     Cervical back: Normal range of motion and neck supple.  Skin:    General: Skin is warm and dry.  Neurological:     Mental Status: She is alert and oriented to person, place, and time.      UC Treatments / Results  Labs (all labs ordered are listed, but only abnormal results are displayed) Labs Reviewed - No data to display  EKG   Radiology No results found.  Procedures Procedures (including critical care time)  Medications Ordered in UC Medications - No data to display  Initial Impression / Assessment and Plan / UC Course  I have reviewed the triage vital signs and the nursing notes.  Pertinent labs & imaging results that were available during my care of the patient were reviewed by me and considered in my medical decision making (see chart for details).    Patient was tachycardic at triage, HR decreased to 100-110s when I first arrived in the room. Stayed around 115 during the visit. Patient states this has been consistent for the past few months, PCP aware, no associated symptoms.  Will have patient increase fluid intake  and continue to monitor.  Will refill metformin at this time. Follow up with PCP for further evaluation and refills.  Final Clinical Impressions(s) / UC Diagnoses   Final diagnoses:  Encounter for medication refill   ED Prescriptions    Medication Sig Dispense Auth. Provider   metFORMIN (GLUCOPHAGE) 1000 MG tablet Take 1 tablet (1,000 mg total) by mouth 2 (two) times daily with a meal. 180 tablet Ok Edwards, PA-C     PDMP not reviewed this encounter.   Ok Edwards, PA-C 12/25/19 1859

## 2019-12-25 NOTE — ED Notes (Signed)
Patient able to ambulate independently  

## 2019-12-25 NOTE — Discharge Instructions (Signed)
Metformin refilled for 90 days. Follow up with PCP for recheck as scheduled and refills.

## 2020-04-01 ENCOUNTER — Other Ambulatory Visit: Payer: Self-pay | Admitting: Urgent Care

## 2020-04-01 DIAGNOSIS — Z1231 Encounter for screening mammogram for malignant neoplasm of breast: Secondary | ICD-10-CM

## 2020-04-30 ENCOUNTER — Other Ambulatory Visit: Payer: Self-pay

## 2020-04-30 ENCOUNTER — Ambulatory Visit
Admission: RE | Admit: 2020-04-30 | Discharge: 2020-04-30 | Disposition: A | Discharge status: Home / Self Care | Payer: No Typology Code available for payment source | Source: Ambulatory Visit | Attending: Urgent Care | Admitting: Urgent Care

## 2020-04-30 DIAGNOSIS — Z1231 Encounter for screening mammogram for malignant neoplasm of breast: Secondary | ICD-10-CM

## 2020-09-04 ENCOUNTER — Encounter: Payer: Self-pay | Admitting: Gastroenterology

## 2020-11-27 ENCOUNTER — Encounter: Payer: No Typology Code available for payment source | Admitting: Gastroenterology

## 2021-06-01 ENCOUNTER — Other Ambulatory Visit: Payer: Self-pay | Admitting: Urgent Care

## 2021-06-01 DIAGNOSIS — Z1231 Encounter for screening mammogram for malignant neoplasm of breast: Secondary | ICD-10-CM

## 2021-07-02 ENCOUNTER — Ambulatory Visit
Admission: RE | Admit: 2021-07-02 | Discharge: 2021-07-02 | Disposition: A | Discharge status: Home / Self Care | Payer: No Typology Code available for payment source | Source: Ambulatory Visit

## 2021-07-02 ENCOUNTER — Other Ambulatory Visit: Payer: Self-pay

## 2021-07-02 DIAGNOSIS — Z1231 Encounter for screening mammogram for malignant neoplasm of breast: Secondary | ICD-10-CM

## 2021-10-14 ENCOUNTER — Other Ambulatory Visit: Payer: Self-pay

## 2021-10-14 ENCOUNTER — Ambulatory Visit
Admission: EM | Admit: 2021-10-14 | Discharge: 2021-10-14 | Disposition: A | Discharge status: Home / Self Care | Payer: No Typology Code available for payment source | Attending: Internal Medicine | Admitting: Internal Medicine

## 2021-10-14 ENCOUNTER — Encounter: Payer: Self-pay | Admitting: Emergency Medicine

## 2021-10-14 DIAGNOSIS — T7840XA Allergy, unspecified, initial encounter: Secondary | ICD-10-CM | POA: Diagnosis not present

## 2021-10-14 DIAGNOSIS — B372 Candidiasis of skin and nail: Secondary | ICD-10-CM | POA: Diagnosis not present

## 2021-10-14 DIAGNOSIS — R300 Dysuria: Secondary | ICD-10-CM | POA: Diagnosis present

## 2021-10-14 LAB — POCT URINALYSIS DIP (MANUAL ENTRY)
Bilirubin, UA: NEGATIVE
Glucose, UA: NEGATIVE mg/dL
Ketones, POC UA: NEGATIVE mg/dL
Leukocytes, UA: NEGATIVE
Nitrite, UA: NEGATIVE
Protein Ur, POC: NEGATIVE mg/dL
Spec Grav, UA: 1.025 (ref 1.010–1.025)
Urobilinogen, UA: 1 E.U./dL
pH, UA: 5.5 (ref 5.0–8.0)

## 2021-10-14 MED ORDER — FLUCONAZOLE 150 MG PO TABS: 150.0000 mg | ORAL_TABLET | Freq: Every day | ORAL | 0 refills | Status: DC

## 2021-10-14 MED ORDER — URIBEL 118 MG PO CAPS: 1.0000 | ORAL_CAPSULE | Freq: Four times a day (QID) | ORAL | 0 refills | Status: DC

## 2021-10-14 NOTE — Discharge Instructions (Addendum)
Your painful urination is most likely due to external skin irritation.  I suspect this to be yeast. ?Please take 1 Diflucan tablet. ?If the symptoms persist you can take Uribel, which helps with frequency and discomfort with urination. ?We will send your urine out for a culture.  We will only treat if positive for bacteria. ?Regarding your rash, please start taking Benadryl on an as-needed basis.  You can take 25 to 50 mg 2-3 times daily.  Keep in mind this medication is very sedating. ? ?

## 2021-10-14 NOTE — ED Triage Notes (Signed)
Patient has rash on face and inflamed feeling to skin.  This is the forth day of using a new makeup.   ? ?Patient reports a history of uti.  Uncomfortable , pressure in lower abdomen, urinating more frequently.  Recently has had a lot of stress and is a diabetic.  Patient is concerned for a uti ?

## 2021-10-17 ENCOUNTER — Encounter: Payer: Self-pay | Admitting: Urgent Care

## 2021-10-17 LAB — URINE CULTURE: Culture: >=100000 — AB

## 2021-10-17 NOTE — ED Provider Notes (Signed)
?[facility] ? ? ? ?CSN: [id number] ?Arrival date & time: 10/14/21  1658 ? ? ?  ? ?History   ?Chief Complaint ?Chief Complaint  ?Patient presents with  ? Urinary Tract Infection  ? Rash  ? ? ?HPI ?[name] is a 51 y.o. female.  ? ?Pleasant 51 year old female presents today with concerns of a possible UTI.  She states she has been having lower pelvic pressure for the past several days.  Has had increased stress recently and is diabetic, states sugars are higher than she would like.  Denies any vaginal discharge but does endorse some external vaginal itching.  States she has been frequently urinating with mild discomfort.  Denies flank pain or fever.  Has not tried any over-the-counter medications. ? ?Patient also concerned about a "tightness" in her face of her cheeks.  She started using a make-up that was new 4 days ago and feels it is a reaction.  She has not tried any over-the-counter or oral medications for this.  She did stop using the make-up.  Denies swollen lips, dysphagia, swelling of the tongue, wheezing, GERD. ? ? ?Urinary Tract Infection ?Rash ? ?Past Medical History:  ?Diagnosis Date  ? Anemia   ? Diabetes mellitus   ? Type 2  ? Fibroid   ? Hypertension   ? Infection   ? UTI  ? Vaginal delivery [phone]  ? Vaginal Pap smear, abnormal   ? ? ?Patient Active Problem List  ? Diagnosis Date Noted  ? Fibroids 12/22/2015  ? Post-operative state 12/22/2015  ? DUB (dysfunctional uterine bleeding) 10/12/2015  ? Obesity 03/01/2012  ? Diabetes mellitus ([place]) 10/26/2011  ? ? ?Past Surgical History:  ?Procedure Laterality Date  ? ABDOMINAL HYSTERECTOMY    ? KNEE SURGERY    ? x 3  ? ROBOTIC ASSISTED TOTAL HYSTERECTOMY WITH SALPINGECTOMY Bilateral 12/22/2015  ? Procedure: ROBOTIC ASSISTED TOTAL HYSTERECTOMY WITH BILATERAL SALPINGECTOMY;  Surgeon: Lavonia Drafts, MD;  Location: [facility];  Service: Gynecology;  Laterality: Bilateral;  ? ? ?OB History   ? ? Gravida  ?3  ? Para  ?3  ? Term  ?3   ? Preterm  ?   ? AB  ?   ? Living  ?3  ?  ? ? SAB  ?   ? IAB  ?   ? Ectopic  ?   ? Multiple  ?   ? Live Births  ?   ?   ?  ?  ? ? ? ?Home Medications   ? ?Prior to Admission medications   ?Medication Sig Start Date End Date Taking? Authorizing Provider  ?fluconazole (DIFLUCAN) 150 MG tablet Take 1 tablet (150 mg total) by mouth daily. 10/14/21  Yes Gizel Riedlinger L, PA  ?liraglutide (VICTOZA) 18 MG/3ML SOPN Inject 1.2 mg into the skin.    [provider]  ?losartan (COZAAR) 50 MG tablet Take 50 mg by mouth daily.    [provider]  ?metFORMIN (GLUCOPHAGE) 1000 MG tablet Take 1 tablet (1,000 mg total) by mouth 2 (two) times daily with a meal. 12/25/19 03/24/20  Tasia Catchings, Amy V, PA-C  ?metFORMIN (GLUCOPHAGE) 1000 MG tablet metformin 1,000 mg tablet ? TAKE 1 TABLET TWICE A DAY BY ORAL ROUTE FOR 90 DAYS.    [provider]  ?Meth-Hyo-M Bl-Na Phos-Ph Sal (URIBEL) 118 MG CAPS Take 1 capsule (118 mg total) by mouth in the morning, at noon, in the evening, and at bedtime. 10/14/21   Chaney Malling, PA  ?  nateglinide (STARLIX) 120 MG tablet Take 120 mg by mouth 3 (three) times daily. 08/10/21   [provider]  ?carbamazepine (TEGRETOL) 200 MG tablet 200 mg day 1, 200 mg twice daily for day 2 and 3, 400 mg twice daily for day 3 and 4. Use 400 mg twice daily, as needed, on days 5 through 7. 02/28/16 02/11/19  Joy, Shawn C, PA-C  ?glimepiride (AMARYL) 4 MG tablet Take 1 tablet (4 mg total) by mouth daily with breakfast. 12/23/15 02/11/19  Lavonia Drafts, MD  ?lisinopril (PRINIVIL,ZESTRIL) 5 MG tablet Take 5 mg by mouth daily.  02/11/19  [provider]  ?sitaGLIPtin-metformin (JANUMET) 50-1000 MG per tablet Take 1 tablet by mouth 2 (two) times daily with a meal.  02/11/19  [provider]  ? ? ?Family History ?Family History  ?Problem Relation Age of Onset  ? Hypertension Mother   ? Diabetes Father   ? Hypertension Father   ? Hypertension Maternal Grandmother   ? Hypertension  Maternal Grandfather   ? Hypertension Paternal Grandmother   ? Hypertension Paternal Grandfather   ? Breast cancer Maternal Aunt   ? ? ?Social History ?Social History  ? ?Tobacco Use  ? Smoking status: Never  ? Smokeless tobacco: Never  ?Vaping Use  ? Vaping Use: Never used  ?Substance Use Topics  ? Alcohol use: No  ? Drug use: No  ? ? ? ?Allergies   ?Bactrim [sulfamethoxazole-trimethoprim] and Percocet [oxycodone-acetaminophen] ? ? ?Review of Systems ?Review of Systems  ?Genitourinary:  Positive for dysuria, frequency and pelvic pain (pressure).  ?Skin:  Positive for rash.  ?All other systems reviewed and are negative. ? ? ?Physical Exam ?Triage Vital Signs ?ED Triage Vitals  ?Enc Vitals Group  ?   BP 10/14/21 1859 139/80  ?   Pulse Rate 10/14/21 1859 (!) 104  ?   Resp 10/14/21 1859 20  ?   Temp 10/14/21 1859 97.9 ?F (36.6 ?C)  ?   Temp Source 10/14/21 1859 Oral  ?   SpO2 10/14/21 1859 97 %  ?   Weight --   ?   Height --   ?   Head Circumference --   ?   Peak Flow --   ?   Pain Score 10/14/21 1855 9  ?   Pain Loc --   ?   Pain Edu? --   ?   Excl. in [place]? --   ? ?No data found. ? ?Updated Vital Signs ?BP (!) 147/64 (BP Location: Left Arm) Comment (BP Location): regular /forearm  Pulse (!) 104   Temp 97.9 ?F (36.6 ?C) (Oral)   Resp 20   LMP 10/02/2015   SpO2 97%  ? ?Visual Acuity ?Right Eye Distance:   ?Left Eye Distance:   ?Bilateral Distance:   ? ?Right Eye Near:   ?Left Eye Near:    ?Bilateral Near:    ? ?Physical Exam ?Vitals and nursing note reviewed.  ?Constitutional:   ?   General: She is not in acute distress. ?   Appearance: Normal appearance. She is obese. She is not ill-appearing, toxic-appearing or diaphoretic.  ?HENT:  ?   Head: Normocephalic and atraumatic.  ?   Nose: Nose normal. No congestion or rhinorrhea.  ?   Mouth/Throat:  ?   Mouth: Mucous membranes are moist.  ?   Pharynx: Oropharynx is clear. No oropharyngeal exudate or posterior oropharyngeal erythema.  ?Eyes:  ?   Pupils: Pupils are  equal, round, and reactive to light.  ?  Cardiovascular:  ?   Rate and Rhythm: Normal rate.  ?   Pulses: Normal pulses.  ?   Heart sounds: Normal heart sounds. No murmur heard. ?  No friction rub. No gallop.  ?Pulmonary:  ?   Effort: Pulmonary effort is normal. No respiratory distress.  ?   Breath sounds: Normal breath sounds. No wheezing or rales.  ?Chest:  ?   Chest wall: No tenderness.  ?Abdominal:  ?   General: Abdomen is flat. Bowel sounds are normal. There is no distension.  ?   Palpations: Abdomen is soft. There is no mass.  ?   Tenderness: There is no abdominal tenderness. There is no right CVA tenderness, left CVA tenderness, guarding or rebound.  ?   Hernia: No hernia is present.  ?Musculoskeletal:  ?   Cervical back: Normal range of motion and neck supple. No rigidity or tenderness.  ?   Right lower leg: No edema.  ?   Left lower leg: No edema.  ?Lymphadenopathy:  ?   Cervical: No cervical adenopathy.  ?Skin: ?   General: Skin is warm.  ?   Capillary Refill: Capillary refill takes less than 2 seconds.  ?   Findings: No bruising, erythema or rash.  ?   Comments: Unable to appreciate rash or swelling upon facial exam  ?Neurological:  ?   General: No focal deficit present.  ?   Mental Status: She is alert. Mental status is at baseline.  ?Psychiatric:     ?   Mood and Affect: Mood normal.  ? ? ? ?UC Treatments / Results  ?Labs ?(all labs ordered are listed, but only abnormal results are displayed) ?POCT URINALYSIS DIP (MANUAL ENTRY) - Abnormal; Notable for the following components:  ? Blood, UA moderate (*)   ? All other components within normal limits  ? ? ?EKG ? ? ?Radiology ?No results found. ? ?Procedures ?Procedures (including critical care time) ? ?Medications Ordered in UC ?Medications - No data to display ? ?Initial Impression / Assessment and Plan / UC Course  ?I have reviewed the triage vital signs and the nursing notes. ? ?Pertinent labs & imaging results that were available during my care of the  patient were reviewed by me and considered in my medical decision making (see chart for details). ? ?  ? ?Dysuria -UA in office positive only for moderate blood.  We will start treatment with Uribel only for symptom

## 2021-10-18 ENCOUNTER — Telehealth (HOSPITAL_COMMUNITY): Payer: Self-pay | Admitting: Emergency Medicine

## 2021-10-18 MED ORDER — CEPHALEXIN 500 MG PO CAPS: 500.0000 mg | ORAL_CAPSULE | Freq: Two times a day (BID) | ORAL | 0 refills | Status: AC

## 2022-02-22 LAB — COLOGUARD: COLOGUARD: NEGATIVE

## 2022-02-22 LAB — EXTERNAL GENERIC LAB PROCEDURE: COLOGUARD: NEGATIVE

## 2022-05-02 ENCOUNTER — Ambulatory Visit
Admission: EM | Admit: 2022-05-02 | Discharge: 2022-05-02 | Disposition: A | Discharge status: Home / Self Care | Payer: No Typology Code available for payment source | Attending: Physician Assistant | Admitting: Physician Assistant

## 2022-05-02 DIAGNOSIS — N3000 Acute cystitis without hematuria: Secondary | ICD-10-CM | POA: Diagnosis present

## 2022-05-02 LAB — POCT URINALYSIS DIP (MANUAL ENTRY)
Bilirubin, UA: NEGATIVE
Glucose, UA: NEGATIVE mg/dL
Nitrite, UA: NEGATIVE
Protein Ur, POC: NEGATIVE mg/dL
Spec Grav, UA: >=1.03 — AB (ref 1.010–1.025)
Urobilinogen, UA: 1 E.U./dL
pH, UA: 5.5 (ref 5.0–8.0)

## 2022-05-02 MED ORDER — NITROFURANTOIN MONOHYD MACRO 100 MG PO CAPS: 100.0000 mg | ORAL_CAPSULE | Freq: Two times a day (BID) | ORAL | 0 refills | Status: DC

## 2022-05-02 NOTE — ED Triage Notes (Signed)
Pt c/o frequency, dysuria, onset ~ Saturday

## 2022-05-02 NOTE — ED Provider Notes (Signed)
[name]    CSN: [id number] Arrival date & time: 05/02/22  1642      History   Chief Complaint Chief Complaint  Patient presents with   Urinary Frequency         HPI [name] is a 51 y.o. female.   Patient here today for evaluation of urinary frequency and urinary urgency that started 2 days ago.  She denies any abdominal pain but has had some flank pain.  She has not had fever.  She denies any nausea or vomiting.  She does not report treatment for symptoms.  The history is provided by the patient.  Urinary Frequency Pertinent negatives include no abdominal pain and no shortness of breath.    Past Medical History:  Diagnosis Date   Anemia    Diabetes mellitus    Type 2   Fibroid    Hypertension    Infection    UTI   Vaginal delivery [phone]   Vaginal Pap smear, abnormal     Patient Active Problem List   Diagnosis Date Noted   Fibroids 12/22/2015   Post-operative state 12/22/2015   DUB (dysfunctional uterine bleeding) 10/12/2015   Obesity 03/01/2012   Diabetes mellitus ([place]) 10/26/2011    Past Surgical History:  Procedure Laterality Date   ABDOMINAL HYSTERECTOMY     KNEE SURGERY     x 3   ROBOTIC ASSISTED TOTAL HYSTERECTOMY WITH SALPINGECTOMY Bilateral 12/22/2015   Procedure: ROBOTIC ASSISTED TOTAL HYSTERECTOMY WITH BILATERAL SALPINGECTOMY;  Surgeon: Lavonia Drafts, MD;  Location: [facility];  Service: Gynecology;  Laterality: Bilateral;    OB History     Gravida  3   Para  3   Term  3   Preterm      AB      Living  3      SAB      IAB      Ectopic      Multiple      Live Births               Home Medications    Prior to Admission medications   Medication Sig Start Date End Date Taking? Authorizing Provider  nitrofurantoin, macrocrystal-monohydrate, (MACROBID) 100 MG capsule Take 1 capsule (100 mg total) by mouth 2 (two) times daily. 05/02/22  Yes Francene Finders, PA-C  fluconazole  (DIFLUCAN) 150 MG tablet Take 1 tablet (150 mg total) by mouth daily. 10/14/21   Crain, Whitney L, PA  liraglutide (VICTOZA) 18 MG/3ML SOPN Inject 1.2 mg into the skin.    [provider]  losartan (COZAAR) 50 MG tablet Take 50 mg by mouth daily.    [provider]  metFORMIN (GLUCOPHAGE) 1000 MG tablet Take 1 tablet (1,000 mg total) by mouth 2 (two) times daily with a meal. 12/25/19 03/24/20  Yu, Amy V, PA-C  metFORMIN (GLUCOPHAGE) 1000 MG tablet metformin 1,000 mg tablet  TAKE 1 TABLET TWICE A DAY BY ORAL ROUTE FOR 90 DAYS.    [provider]  Meth-Hyo-M Bl-Na Phos-Ph Sal (URIBEL) 118 MG CAPS Take 1 capsule (118 mg total) by mouth in the morning, at noon, in the evening, and at bedtime. 10/14/21   Crain, Whitney L, PA  nateglinide (STARLIX) 120 MG tablet Take 120 mg by mouth 3 (three) times daily. 08/10/21   [provider]  carbamazepine (TEGRETOL) 200 MG tablet 200 mg day 1, 200 mg twice daily for day 2 and 3, 400 mg twice daily for day  3 and 4. Use 400 mg twice daily, as needed, on days 5 through 7. 02/28/16 02/11/19  Joy, Shawn C, PA-C  glimepiride (AMARYL) 4 MG tablet Take 1 tablet (4 mg total) by mouth daily with breakfast. 12/23/15 02/11/19  Lavonia Drafts, MD  lisinopril (PRINIVIL,ZESTRIL) 5 MG tablet Take 5 mg by mouth daily.  02/11/19  [provider]  sitaGLIPtin-metformin (JANUMET) 50-1000 MG per tablet Take 1 tablet by mouth 2 (two) times daily with a meal.  02/11/19  [provider]    Family History Family History  Problem Relation Age of Onset   Hypertension Mother    Diabetes Father    Hypertension Father    Hypertension Maternal Grandmother    Hypertension Maternal Grandfather    Hypertension Paternal Grandmother    Hypertension Paternal Grandfather    Breast cancer Maternal Aunt     Social History Social History   Tobacco Use   Smoking status: Never   Smokeless tobacco: Never  Vaping Use   Vaping Use: Never used   Substance Use Topics   Alcohol use: No   Drug use: No     Allergies   Bactrim [sulfamethoxazole-trimethoprim] and Percocet [oxycodone-acetaminophen]   Review of Systems Review of Systems  Constitutional:  Negative for chills and fever.  Eyes:  Negative for discharge and redness.  Respiratory:  Negative for shortness of breath.   Gastrointestinal:  Negative for abdominal pain, nausea and vomiting.  Genitourinary:  Positive for frequency and urgency. Negative for dysuria.  Musculoskeletal:  Positive for back pain.     Physical Exam Triage Vital Signs ED Triage Vitals [05/02/22 1800]  Enc Vitals Group     BP 122/67     Pulse Rate 100     Resp 16     Temp 97.6 F (36.4 C)     Temp Source Oral     SpO2 97 %     Weight      Height      Head Circumference      Peak Flow      Pain Score 0     Pain Loc      Pain Edu?      Excl. in [place]?    No data found.  Updated Vital Signs BP 122/67 (BP Location: Left Arm)   Pulse 100   Temp 97.6 F (36.4 C) (Oral)   Resp 16   LMP 10/02/2015   SpO2 97%      Physical Exam Vitals and nursing note reviewed.  Constitutional:      General: She is not in acute distress.    Appearance: Normal appearance. She is not ill-appearing.  HENT:     Head: Normocephalic and atraumatic.  Eyes:     Conjunctiva/sclera: Conjunctivae normal.  Cardiovascular:     Rate and Rhythm: Normal rate.  Pulmonary:     Effort: Pulmonary effort is normal.  Neurological:     Mental Status: She is alert.  Psychiatric:        Mood and Affect: Mood normal.        Behavior: Behavior normal.        Thought Content: Thought content normal.      UC Treatments / Results  Labs (all labs ordered are listed, but only abnormal results are displayed) Labs Reviewed  POCT URINALYSIS DIP (MANUAL ENTRY) - Abnormal; Notable for the following components:      Result Value   Clarity, UA cloudy (*)    Ketones, POC UA trace (  5) (*)    Spec Grav, UA >=1.030 (*)     Blood, UA trace-intact (*)    Leukocytes, UA Small (1+) (*)    All other components within normal limits  URINE CULTURE    EKG   Radiology No results found.  Procedures Procedures (including critical care time)  Medications Ordered in UC Medications - No data to display  Initial Impression / Assessment and Plan / UC Course  I have reviewed the triage vital signs and the nursing notes.  Pertinent labs & imaging results that were available during my care of the patient were reviewed by me and considered in my medical decision making (see chart for details).    Macrobid prescribed to cover UTI.  Urine culture ordered.  Recommended follow-up if no gradual improvement or with any further concerns.  Final Clinical Impressions(s) / UC Diagnoses   Final diagnoses:  Acute cystitis without hematuria   Discharge Instructions   None    ED Prescriptions     Medication Sig Dispense Auth. Provider   nitrofurantoin, macrocrystal-monohydrate, (MACROBID) 100 MG capsule Take 1 capsule (100 mg total) by mouth 2 (two) times daily. 10 capsule Francene Finders, PA-C      PDMP not reviewed this encounter.   Francene Finders, PA-C 05/02/22 1819

## 2022-05-05 LAB — URINE CULTURE: Culture: 50000 — AB

## 2022-06-14 ENCOUNTER — Ambulatory Visit
Admission: EM | Admit: 2022-06-14 | Discharge: 2022-06-14 | Disposition: A | Discharge status: Home / Self Care | Payer: No Typology Code available for payment source | Attending: Physician Assistant | Admitting: Physician Assistant

## 2022-06-14 DIAGNOSIS — R5383 Other fatigue: Secondary | ICD-10-CM | POA: Insufficient documentation

## 2022-06-14 DIAGNOSIS — Z1152 Encounter for screening for COVID-19: Secondary | ICD-10-CM | POA: Insufficient documentation

## 2022-06-14 LAB — POCT FASTING CBG KUC MANUAL ENTRY: POCT Glucose (KUC): 133 mg/dL — AB (ref 70–99)

## 2022-06-14 NOTE — ED Provider Notes (Signed)
[name]    CSN: [id number] Arrival date & time: 06/14/22  1713      History   Chief Complaint Chief Complaint  Patient presents with   Fatigue    HPI [name] is a 51 y.o. female.   Patient here today with daughter for evaluation of decreased appetite that she has had the last week. She reports cough and sore throat have resolved but she continues to feel very fatigued and has had decreased appetite linger. She has not had any recent fever. She does not report treatment for symptoms. She admits to not checking her glucose levels today.   The history is provided by the patient.    Past Medical History:  Diagnosis Date   Anemia    Diabetes mellitus    Type 2   Fibroid    Hypertension    Infection    UTI   Vaginal delivery [phone]   Vaginal Pap smear, abnormal     Patient Active Problem List   Diagnosis Date Noted   Fibroids 12/22/2015   Post-operative state 12/22/2015   DUB (dysfunctional uterine bleeding) 10/12/2015   Obesity 03/01/2012   Diabetes mellitus ([place]) 10/26/2011    Past Surgical History:  Procedure Laterality Date   ABDOMINAL HYSTERECTOMY     KNEE SURGERY     x 3   ROBOTIC ASSISTED TOTAL HYSTERECTOMY WITH SALPINGECTOMY Bilateral 12/22/2015   Procedure: ROBOTIC ASSISTED TOTAL HYSTERECTOMY WITH BILATERAL SALPINGECTOMY;  Surgeon: Lavonia Drafts, MD;  Location: [facility];  Service: Gynecology;  Laterality: Bilateral;    OB History     Gravida  3   Para  3   Term  3   Preterm      AB      Living  3      SAB      IAB      Ectopic      Multiple      Live Births               Home Medications    Prior to Admission medications   Medication Sig Start Date End Date Taking? Authorizing Provider  liraglutide (VICTOZA) 18 MG/3ML SOPN Inject 1.2 mg into the skin.   Yes [provider]  losartan (COZAAR) 50 MG tablet Take 50 mg by mouth daily.   Yes [provider]  metFORMIN  (GLUCOPHAGE) 1000 MG tablet Take 1 tablet (1,000 mg total) by mouth 2 (two) times daily with a meal. 12/25/19 06/14/22 Yes Yu, Amy V, PA-C  nateglinide (STARLIX) 120 MG tablet Take 120 mg by mouth 3 (three) times daily. 08/10/21  Yes [provider]  rosuvastatin (CRESTOR) 5 MG tablet Take 5 mg by mouth daily.   Yes [provider]  fluconazole (DIFLUCAN) 150 MG tablet Take 1 tablet (150 mg total) by mouth daily. 10/14/21   Crain, Whitney L, PA  metFORMIN (GLUCOPHAGE) 1000 MG tablet metformin 1,000 mg tablet  TAKE 1 TABLET TWICE A DAY BY ORAL ROUTE FOR 90 DAYS.    [provider]  Meth-Hyo-M Bl-Na Phos-Ph Sal (URIBEL) 118 MG CAPS Take 1 capsule (118 mg total) by mouth in the morning, at noon, in the evening, and at bedtime. 10/14/21   Crain, Whitney L, PA  nitrofurantoin, macrocrystal-monohydrate, (MACROBID) 100 MG capsule Take 1 capsule (100 mg total) by mouth 2 (two) times daily. 05/02/22   Francene Finders, PA-C  carbamazepine (TEGRETOL) 200 MG tablet 200 mg day 1, 200 mg twice daily  for day 2 and 3, 400 mg twice daily for day 3 and 4. Use 400 mg twice daily, as needed, on days 5 through 7. 02/28/16 02/11/19  Joy, Shawn C, PA-C  glimepiride (AMARYL) 4 MG tablet Take 1 tablet (4 mg total) by mouth daily with breakfast. 12/23/15 02/11/19  Lavonia Drafts, MD  lisinopril (PRINIVIL,ZESTRIL) 5 MG tablet Take 5 mg by mouth daily.  02/11/19  [provider]  sitaGLIPtin-metformin (JANUMET) 50-1000 MG per tablet Take 1 tablet by mouth 2 (two) times daily with a meal.  02/11/19  [provider]    Family History Family History  Problem Relation Age of Onset   Hypertension Mother    Diabetes Father    Hypertension Father    Hypertension Maternal Grandmother    Hypertension Maternal Grandfather    Hypertension Paternal Grandmother    Hypertension Paternal Grandfather    Breast cancer Maternal Aunt     Social History Social History   Tobacco Use    Smoking status: Never   Smokeless tobacco: Never  Vaping Use   Vaping Use: Never used  Substance Use Topics   Alcohol use: No   Drug use: No     Allergies   Bactrim [sulfamethoxazole-trimethoprim] and Percocet [oxycodone-acetaminophen]   Review of Systems Review of Systems  Constitutional:  Positive for appetite change and fatigue. Negative for chills and fever.  HENT:  Negative for congestion, ear pain and sore throat.   Eyes:  Negative for discharge and redness.  Respiratory:  Negative for cough, shortness of breath and wheezing.   Cardiovascular:  Negative for chest pain.  Gastrointestinal:  Negative for abdominal pain, diarrhea, nausea and vomiting.     Physical Exam Triage Vital Signs ED Triage Vitals  Enc Vitals Group     BP 06/14/22 1815 92/65     Pulse Rate 06/14/22 1815 (!) 101     Resp 06/14/22 1815 18     Temp 06/14/22 1815 98 F (36.7 C)     Temp Source 06/14/22 1815 Oral     SpO2 06/14/22 1815 97 %     Weight --      Height --      Head Circumference --      Peak Flow --      Pain Score 06/14/22 1812 0     Pain Loc --      Pain Edu? --      Excl. in [place]? --    No data found.  Updated Vital Signs BP (!) 95/59   Pulse (!) 101   Temp 98 F (36.7 C) (Oral)   Resp 18   LMP 10/02/2015   SpO2 97%    Physical Exam Vitals and nursing note reviewed.  Constitutional:      General: She is not in acute distress.    Appearance: Normal appearance. She is not ill-appearing.  HENT:     Head: Normocephalic and atraumatic.     Nose: Nose normal. No congestion or rhinorrhea.  Eyes:     Conjunctiva/sclera: Conjunctivae normal.  Cardiovascular:     Rate and Rhythm: Normal rate and regular rhythm.     Heart sounds: Normal heart sounds.  Pulmonary:     Effort: Pulmonary effort is normal. No respiratory distress.     Breath sounds: Normal breath sounds. No wheezing, rhonchi or rales.  Neurological:     Mental Status: She is alert.  Psychiatric:         Mood and Affect: Mood normal.  Behavior: Behavior normal.      UC Treatments / Results  Labs (all labs ordered are listed, but only abnormal results are displayed) Labs Reviewed  POCT FASTING CBG [facility] MANUAL ENTRY - Abnormal; Notable for the following components:      Result Value   POCT Glucose ([facility]) 133 (*)    All other components within normal limits  SARS CORONAVIRUS 2 (TAT 6-24 HRS)  CBC WITH DIFFERENTIAL/PLATELET  COMPREHENSIVE METABOLIC PANEL    EKG   Radiology No results found.  Procedures Procedures (including critical care time)  Medications Ordered in UC Medications - No data to display  Initial Impression / Assessment and Plan / UC Course  I have reviewed the triage vital signs and the nursing notes.  Pertinent labs & imaging results that were available during my care of the patient were reviewed by me and considered in my medical decision making (see chart for details).    BP lower than typical for patient- discussed same- will order labs including covid screening with strict instruction to report to ED with any worsening symptoms. Patient expresses understanding. Work note provided and recommended increased hydration. Will await lab results for further recommendation.   Final Clinical Impressions(s) / UC Diagnoses   Final diagnoses:  Fatigue, unspecified type  Encounter for screening for COVID-19   Discharge Instructions   None    ED Prescriptions   None    PDMP not reviewed this encounter.   Francene Finders, PA-C 06/14/22 1859

## 2022-06-14 NOTE — ED Triage Notes (Signed)
PA notified of BP

## 2022-06-14 NOTE — ED Triage Notes (Signed)
Pt presents with cough, chills, sore throat, lethargy, decreased appetite, nausea for over a week.  Cough, sore throat and nausea have resolved but lethargy and decreased appetite has remained. Very fatigued.

## 2022-06-16 ENCOUNTER — Other Ambulatory Visit: Payer: Self-pay

## 2022-06-16 ENCOUNTER — Ambulatory Visit
Admission: EM | Admit: 2022-06-16 | Discharge: 2022-06-16 | Disposition: A | Discharge status: Home / Self Care | Payer: No Typology Code available for payment source | Attending: Internal Medicine | Admitting: Internal Medicine

## 2022-06-16 ENCOUNTER — Encounter: Payer: Self-pay | Admitting: Emergency Medicine

## 2022-06-16 DIAGNOSIS — R899 Unspecified abnormal finding in specimens from other organs, systems and tissues: Secondary | ICD-10-CM

## 2022-06-16 LAB — COMPREHENSIVE METABOLIC PANEL
ALT: 11 IU/L (ref 0–32)
AST: 11 IU/L (ref 0–40)
Albumin/Globulin Ratio: 1.2 (ref 1.2–2.2)
Albumin: 4.5 g/dL (ref 3.9–4.9)
Alkaline Phosphatase: 50 IU/L (ref 44–121)
BUN/Creatinine Ratio: 14 (ref 9–23)
BUN: 33 mg/dL — ABNORMAL HIGH (ref 6–24)
Bilirubin Total: 0.3 mg/dL (ref 0.0–1.2)
CO2: 23 mmol/L (ref 20–29)
Calcium: 9.7 mg/dL (ref 8.7–10.2)
Chloride: 97 mmol/L (ref 96–106)
Creatinine, Ser: 2.44 mg/dL — ABNORMAL HIGH (ref 0.57–1.00)
Globulin, Total: 3.7 g/dL (ref 1.5–4.5)
Glucose: 124 mg/dL — ABNORMAL HIGH (ref 70–99)
Potassium: 4.1 mmol/L (ref 3.5–5.2)
Sodium: 138 mmol/L (ref 134–144)
Total Protein: 8.2 g/dL (ref 6.0–8.5)
eGFR: 24 mL/min/{1.73_m2} — ABNORMAL LOW (ref >59)

## 2022-06-16 LAB — CBC WITH DIFFERENTIAL/PLATELET
Basophils Absolute: 0.1 10*3/uL (ref 0.0–0.2)
Basos: 1 %
EOS (ABSOLUTE): 0.1 10*3/uL (ref 0.0–0.4)
Eos: 1 %
Hematocrit: 43.7 % (ref 34.0–46.6)
Hemoglobin: 14.4 g/dL (ref 11.1–15.9)
Immature Grans (Abs): 0 10*3/uL (ref 0.0–0.1)
Immature Granulocytes: 0 %
Lymphocytes Absolute: 4.7 10*3/uL — ABNORMAL HIGH (ref 0.7–3.1)
Lymphs: 40 %
MCH: 29.4 pg (ref 26.6–33.0)
MCHC: 33 g/dL (ref 31.5–35.7)
MCV: 89 fL (ref 79–97)
Monocytes Absolute: 0.9 10*3/uL (ref 0.1–0.9)
Monocytes: 7 %
Neutrophils Absolute: 6 10*3/uL (ref 1.4–7.0)
Neutrophils: 51 %
Platelets: 270 10*3/uL (ref 150–450)
RBC: 4.9 x10E6/uL (ref 3.77–5.28)
RDW: 13.2 % (ref 11.7–15.4)
WBC: 11.8 10*3/uL — ABNORMAL HIGH (ref 3.4–10.8)

## 2022-06-16 LAB — SARS CORONAVIRUS 2 (TAT 6-24 HRS): SARS Coronavirus 2: NEGATIVE

## 2022-06-16 NOTE — ED Triage Notes (Signed)
Pt here for lab recheck

## 2022-06-17 LAB — BASIC METABOLIC PANEL
BUN/Creatinine Ratio: 21 (ref 9–23)
BUN: 43 mg/dL — ABNORMAL HIGH (ref 6–24)
CO2: 23 mmol/L (ref 20–29)
Calcium: 10 mg/dL (ref 8.7–10.2)
Chloride: 95 mmol/L — ABNORMAL LOW (ref 96–106)
Creatinine, Ser: 2.08 mg/dL — ABNORMAL HIGH (ref 0.57–1.00)
Glucose: 104 mg/dL — ABNORMAL HIGH (ref 70–99)
Potassium: 4.4 mmol/L (ref 3.5–5.2)
Sodium: 138 mmol/L (ref 134–144)
eGFR: 29 mL/min/{1.73_m2} — ABNORMAL LOW (ref >59)

## 2022-06-18 ENCOUNTER — Encounter (HOSPITAL_COMMUNITY): Payer: Self-pay

## 2022-06-18 ENCOUNTER — Other Ambulatory Visit: Payer: Self-pay

## 2022-06-18 ENCOUNTER — Emergency Department (HOSPITAL_COMMUNITY)
Admission: EM | Admit: 2022-06-18 | Discharge: 2022-06-18 | Disposition: A | Discharge status: Home / Self Care | Payer: No Typology Code available for payment source | Attending: Emergency Medicine | Admitting: Emergency Medicine

## 2022-06-18 DIAGNOSIS — N179 Acute kidney failure, unspecified: Secondary | ICD-10-CM

## 2022-06-18 DIAGNOSIS — R799 Abnormal finding of blood chemistry, unspecified: Secondary | ICD-10-CM | POA: Diagnosis present

## 2022-06-18 DIAGNOSIS — Z7984 Long term (current) use of oral hypoglycemic drugs: Secondary | ICD-10-CM | POA: Insufficient documentation

## 2022-06-18 LAB — CBC WITH DIFFERENTIAL/PLATELET
Abs Immature Granulocytes: 0.01 10*3/uL (ref 0.00–0.07)
Basophils Absolute: 0.1 10*3/uL (ref 0.0–0.1)
Basophils Relative: 1 %
Eosinophils Absolute: 0.1 10*3/uL (ref 0.0–0.5)
Eosinophils Relative: 2 %
HCT: 41.6 % (ref 36.0–46.0)
Hemoglobin: 13.8 g/dL (ref 12.0–15.0)
Immature Granulocytes: 0 %
Lymphocytes Relative: 50 %
Lymphs Abs: 4.1 10*3/uL — ABNORMAL HIGH (ref 0.7–4.0)
MCH: 29.2 pg (ref 26.0–34.0)
MCHC: 33.2 g/dL (ref 30.0–36.0)
MCV: 88.1 fL (ref 80.0–100.0)
Monocytes Absolute: 0.7 10*3/uL (ref 0.1–1.0)
Monocytes Relative: 9 %
Neutro Abs: 3 10*3/uL (ref 1.7–7.7)
Neutrophils Relative %: 38 %
Platelets: 273 10*3/uL (ref 150–400)
RBC: 4.72 MIL/uL (ref 3.87–5.11)
RDW: 12.5 % (ref 11.5–15.5)
WBC: 7.9 10*3/uL (ref 4.0–10.5)
nRBC: 0 % (ref 0.0–0.2)

## 2022-06-18 LAB — COMPREHENSIVE METABOLIC PANEL
ALT: 16 U/L (ref 0–44)
AST: 18 U/L (ref 15–41)
Albumin: 4.3 g/dL (ref 3.5–5.0)
Alkaline Phosphatase: 43 U/L (ref 38–126)
Anion gap: 12 (ref 5–15)
BUN: 39 mg/dL — ABNORMAL HIGH (ref 6–20)
CO2: 27 mmol/L (ref 22–32)
Calcium: 10 mg/dL (ref 8.9–10.3)
Chloride: 100 mmol/L (ref 98–111)
Creatinine, Ser: 1.56 mg/dL — ABNORMAL HIGH (ref 0.44–1.00)
GFR, Estimated: 40 mL/min — ABNORMAL LOW (ref >60)
Glucose, Bld: 143 mg/dL — ABNORMAL HIGH (ref 70–99)
Potassium: 3.9 mmol/L (ref 3.5–5.1)
Sodium: 139 mmol/L (ref 135–145)
Total Bilirubin: 0.5 mg/dL (ref 0.3–1.2)
Total Protein: 8.7 g/dL — ABNORMAL HIGH (ref 6.5–8.1)

## 2022-06-18 MED ORDER — SODIUM CHLORIDE 0.9 % IV BOLUS: 1000.0000 mL | Freq: Once | INTRAVENOUS | Status: DC

## 2022-06-18 NOTE — ED Provider Triage Note (Signed)
Emergency Medicine Provider Triage Evaluation Note  [name] , a 51 y.o. female  was evaluated in triage.  Pt complains of fatigue.  Has been fatigued over the past week, states that is getting significantly better.  Saw urgent care twice and had elevated creatinines each time.  Presented to her primary care provider today who was unable to draw labs so he referred her to the ED for evaluation of acute kidney injury.  She has been eating and drinking as normal for the past 3 days.  States she feels slightly tired but is improved, no other symptoms.  Review of Systems  Positive: As above Negative: As above  Physical Exam  BP 109/75 (BP Location: Left Arm)   Pulse (!) 116   Temp 98.8 F (37.1 C) (Oral)   Resp 18   Ht '5\' 2"'$  (1.575 m)   Wt 113 kg   LMP 10/02/2015   SpO2 99%   BMI 45.56 kg/m  Gen:   Awake, no distress   Resp:  Normal effort  MSK:   Moves extremities without difficulty  Other:    Medical Decision Making  Medically screening exam initiated at 2:21 PM.  Appropriate orders placed.  [name] was informed that the remainder of the evaluation will be completed by another provider, this initial triage assessment does not replace that evaluation, and the importance of remaining in the ED until their evaluation is complete.     Roylene Reason, [place] 06/18/22 1422

## 2022-06-18 NOTE — ED Provider Notes (Signed)
[facility] DEPT Provider Note   CSN: [id number] Arrival date & time: 06/18/22  1345     History  Chief Complaint  Patient presents with   abnormal labs    [name] is a 51 y.o. female.  51 yo F with a chief complaints of worsening renal function.  This was noticed to her family doctor's office.  She has had this checked a couple times this month and has had some modest improvement.  This is in the context of her having what sounds like an upper respiratory illness.  He has not been eating and drinking as well as she normally would.  Since she has been eating and drinking better she has felt better.  She called her family doctor today who suggested that she come to the ED for recheck.        Home Medications Prior to Admission medications   Medication Sig Start Date End Date Taking? Authorizing Provider  fluconazole (DIFLUCAN) 150 MG tablet Take 1 tablet (150 mg total) by mouth daily. 10/14/21   Crain, Whitney L, PA  liraglutide (VICTOZA) 18 MG/3ML SOPN Inject 1.2 mg into the skin.    [provider]  losartan (COZAAR) 50 MG tablet Take 50 mg by mouth daily.    [provider]  metFORMIN (GLUCOPHAGE) 1000 MG tablet Take 1 tablet (1,000 mg total) by mouth 2 (two) times daily with a meal. 12/25/19 06/14/22  Yu, Amy V, PA-C  metFORMIN (GLUCOPHAGE) 1000 MG tablet metformin 1,000 mg tablet  TAKE 1 TABLET TWICE A DAY BY ORAL ROUTE FOR 90 DAYS.    [provider]  Meth-Hyo-M Bl-Na Phos-Ph Sal (URIBEL) 118 MG CAPS Take 1 capsule (118 mg total) by mouth in the morning, at noon, in the evening, and at bedtime. 10/14/21   Crain, Whitney L, PA  nateglinide (STARLIX) 120 MG tablet Take 120 mg by mouth 3 (three) times daily. 08/10/21   [provider]  nitrofurantoin, macrocrystal-monohydrate, (MACROBID) 100 MG capsule Take 1 capsule (100 mg total) by mouth 2 (two) times daily. 05/02/22   Francene Finders, PA-C  rosuvastatin  (CRESTOR) 5 MG tablet Take 5 mg by mouth daily.    [provider]  carbamazepine (TEGRETOL) 200 MG tablet 200 mg day 1, 200 mg twice daily for day 2 and 3, 400 mg twice daily for day 3 and 4. Use 400 mg twice daily, as needed, on days 5 through 7. 02/28/16 02/11/19  Joy, Shawn C, PA-C  glimepiride (AMARYL) 4 MG tablet Take 1 tablet (4 mg total) by mouth daily with breakfast. 12/23/15 02/11/19  Lavonia Drafts, MD  lisinopril (PRINIVIL,ZESTRIL) 5 MG tablet Take 5 mg by mouth daily.  02/11/19  [provider]  sitaGLIPtin-metformin (JANUMET) 50-1000 MG per tablet Take 1 tablet by mouth 2 (two) times daily with a meal.  02/11/19  [provider]      Allergies    Bactrim [sulfamethoxazole-trimethoprim] and Percocet [oxycodone-acetaminophen]    Review of Systems   Review of Systems  Physical Exam Updated Vital Signs BP (!) 168/98   Pulse (!) 109   Temp 98.7 F (37.1 C) (Oral)   Resp 18   Ht '5\' 2"'$  (1.575 m)   Wt 113 kg   LMP 10/02/2015   SpO2 98%   BMI 45.56 kg/m  Physical Exam Vitals and nursing note reviewed.  Constitutional:      General: She is not in acute distress.    Appearance: She is  well-developed. She is not diaphoretic.  HENT:     Head: Normocephalic and atraumatic.  Eyes:     Pupils: Pupils are equal, round, and reactive to light.  Cardiovascular:     Rate and Rhythm: Normal rate and regular rhythm.     Heart sounds: No murmur heard.    No friction rub. No gallop.  Pulmonary:     Effort: Pulmonary effort is normal.     Breath sounds: No wheezing or rales.  Abdominal:     General: There is no distension.     Palpations: Abdomen is soft.     Tenderness: There is no abdominal tenderness.  Musculoskeletal:        General: No tenderness.     Cervical back: Normal range of motion and neck supple.  Skin:    General: Skin is warm and dry.  Neurological:     Mental Status: She is alert and oriented to person, place, and time.   Psychiatric:        Behavior: Behavior normal.     ED Results / Procedures / Treatments   Labs (all labs ordered are listed, but only abnormal results are displayed) Labs Reviewed  COMPREHENSIVE METABOLIC PANEL - Abnormal; Notable for the following components:      Result Value   Glucose, Bld 143 (*)    BUN 39 (*)    Creatinine, Ser 1.56 (*)    Total Protein 8.7 (*)    GFR, Estimated 40 (*)    All other components within normal limits  CBC WITH DIFFERENTIAL/PLATELET - Abnormal; Notable for the following components:   Lymphs Abs 4.1 (*)    All other components within normal limits  URINALYSIS, ROUTINE W REFLEX MICROSCOPIC    EKG None  Radiology No results found.  Procedures Procedures    Medications Ordered in ED Medications  sodium chloride 0.9 % bolus 1,000 mL (1,000 mLs Intravenous Not Given 06/18/22 1925)    ED Course/ Medical Decision Making/ A&P                           Medical Decision Making  51 yo F with a chief complaints of an acute kidney injury.  This was diagnosed on the 19th of this month.  Since then the patient has had her blood work rechecked and then had called her family doctor and they suggest she coming in today to have it rechecked again.  She actually is feeling a bit better.  Was suffering from what sounds like an upper respiratory illness.  Renal function today 1.5 which is down from 2.4.  Will have her continue to have this followed as an outpatient.  7:27 PM:  I have discussed the diagnosis/risks/treatment options with the patient.  Evaluation and diagnostic testing in the emergency department does not suggest an emergent condition requiring admission or immediate intervention beyond what has been performed at this time.  They will follow up with PCP. We also discussed returning to the ED immediately if new or worsening sx occur. We discussed the sx which are most concerning (e.g., sudden worsening pain, fever, inability to tolerate by mouth)  that necessitate immediate return. Medications administered to the patient during their visit and any new prescriptions provided to the patient are listed below.  Medications given during this visit Medications  sodium chloride 0.9 % bolus 1,000 mL (1,000 mLs Intravenous Not Given 06/18/22 1925)     The patient appears reasonably screen and/or  stabilized for discharge and I doubt any other medical condition or other [facility] requiring further screening, evaluation, or treatment in the ED at this time prior to discharge.          Final Clinical Impression(s) / ED Diagnoses Final diagnoses:  AKI (acute kidney injury) [facility])    Rx / DC Orders ED Discharge Orders     None         Deno Etienne, DO 06/18/22 1927

## 2022-06-18 NOTE — Discharge Instructions (Signed)
Injuries closed can for the next few days.  Please follow-up with your family doctor in the office.  They likely want to recheck your blood work.

## 2022-06-18 NOTE — ED Triage Notes (Signed)
Pt sent to ed from pcp for creatinine of 2.08. Pt reports nausea and fatigue.

## 2022-06-22 ENCOUNTER — Other Ambulatory Visit: Payer: Self-pay | Admitting: Family Medicine

## 2022-06-22 DIAGNOSIS — N179 Acute kidney failure, unspecified: Secondary | ICD-10-CM

## 2022-07-01 ENCOUNTER — Ambulatory Visit
Admission: RE | Admit: 2022-07-01 | Discharge: 2022-07-01 | Disposition: A | Discharge status: Home / Self Care | Payer: No Typology Code available for payment source | Source: Ambulatory Visit | Attending: Family Medicine | Admitting: Family Medicine

## 2022-07-01 DIAGNOSIS — N179 Acute kidney failure, unspecified: Secondary | ICD-10-CM

## 2022-07-28 ENCOUNTER — Other Ambulatory Visit: Payer: Self-pay | Admitting: Physician Assistant

## 2022-07-28 DIAGNOSIS — Z1231 Encounter for screening mammogram for malignant neoplasm of breast: Secondary | ICD-10-CM

## 2022-07-29 ENCOUNTER — Ambulatory Visit
Admission: RE | Admit: 2022-07-29 | Discharge: 2022-07-29 | Disposition: A | Discharge status: Home / Self Care | Payer: No Typology Code available for payment source | Source: Ambulatory Visit | Attending: Physician Assistant | Admitting: Physician Assistant

## 2022-07-29 DIAGNOSIS — Z1231 Encounter for screening mammogram for malignant neoplasm of breast: Secondary | ICD-10-CM

## 2022-12-07 ENCOUNTER — Ambulatory Visit
Admission: EM | Admit: 2022-12-07 | Discharge: 2022-12-07 | Disposition: A | Discharge status: Home / Self Care | Payer: No Typology Code available for payment source | Attending: Internal Medicine | Admitting: Internal Medicine

## 2022-12-07 ENCOUNTER — Other Ambulatory Visit (HOSPITAL_COMMUNITY)
Admission: RE | Admit: 2022-12-07 | Discharge: 2022-12-07 | Disposition: A | Discharge status: Home / Self Care | Payer: No Typology Code available for payment source | Source: Other Acute Inpatient Hospital

## 2022-12-07 DIAGNOSIS — R350 Frequency of micturition: Secondary | ICD-10-CM | POA: Diagnosis not present

## 2022-12-07 DIAGNOSIS — R3915 Urgency of urination: Secondary | ICD-10-CM

## 2022-12-07 LAB — POCT URINALYSIS DIP (MANUAL ENTRY)
Bilirubin, UA: NEGATIVE
Glucose, UA: 250 mg/dL — AB
Ketones, POC UA: NEGATIVE mg/dL
Nitrite, UA: NEGATIVE
Spec Grav, UA: 1.025 (ref 1.010–1.025)
Urobilinogen, UA: 4 E.U./dL — AB
pH, UA: 6.5 (ref 5.0–8.0)

## 2022-12-07 MED ORDER — CEPHALEXIN 500 MG PO CAPS: 500.0000 mg | ORAL_CAPSULE | Freq: Two times a day (BID) | ORAL | 0 refills | Status: AC

## 2022-12-07 NOTE — Discharge Instructions (Addendum)
Your urinalysis shows Indie Boehne blood cells which help to fight infections but does not show nitrates which is an enzyme released for bacteria, your urine will be sent to the lab to determine exactly which bacteria is present, if any changes need to be made to your medications you will be notified  Begin use of Celexa and every morning and every evening for 5 days, this antibiotic was chosen based on the last urine culture seen in your chart which showed Klebsiella  You may use over-the-counter Azo to help minimize your symptoms until antibiotic removes bacteria, this medication will turn your urine orange  Increase your fluid intake through use of water  As always practice good hygiene, wiping front to back and avoidance of scented vaginal products to prevent further irritation  If symptoms continue to persist after use of medication or recur please follow-up with urgent care or your primary doctor as needed

## 2022-12-07 NOTE — ED Triage Notes (Signed)
Pt c/o burning of urination with urgency and frequency x2 days. Hx of UTI's and feels the same.

## 2022-12-07 NOTE — ED Provider Notes (Signed)
[name]    CSN: [id number] Arrival date & time: 12/07/22  1822      History   Chief Complaint Chief Complaint  Patient presents with   Urinary Tract Infection    HPI [name] is a 52 y.o. female.   Patient presents for evaluation of urinary frequency, urgency and dysuria present for 2 days.  Associated intermittent right flank pain.  Has not attempted treatment.  Endorses history of reoccurring UTI that has had similar symptoms in the past.  Denies vaginal symptoms, lower abdominal pain or pressure, fevers, hematuria.  Past Medical History:  Diagnosis Date   Anemia    Diabetes mellitus    Type 2   Fibroid    Hypertension    Infection    UTI   Vaginal delivery [phone]   Vaginal Pap smear, abnormal     Patient Active Problem List   Diagnosis Date Noted   Fibroids 12/22/2015   Post-operative state 12/22/2015   DUB (dysfunctional uterine bleeding) 10/12/2015   Obesity 03/01/2012   Diabetes mellitus (HCC) 10/26/2011    Past Surgical History:  Procedure Laterality Date   ABDOMINAL HYSTERECTOMY     KNEE SURGERY     x 3   ROBOTIC ASSISTED TOTAL HYSTERECTOMY WITH SALPINGECTOMY Bilateral 12/22/2015   Procedure: ROBOTIC ASSISTED TOTAL HYSTERECTOMY WITH BILATERAL SALPINGECTOMY;  Surgeon: Willodean Rosenthal, MD;  Location: [facility];  Service: Gynecology;  Laterality: Bilateral;    OB History     Gravida  3   Para  3   Term  3   Preterm      AB      Living  3      SAB      IAB      Ectopic      Multiple      Live Births               Home Medications    Prior to Admission medications   Medication Sig Start Date End Date Taking? Authorizing Provider  cephALEXin (KEFLEX) 500 MG capsule Take 1 capsule (500 mg total) by mouth 2 (two) times daily for 5 days. 12/07/22 12/12/22 Yes Jonita Hirota R, NP  liraglutide (VICTOZA) 18 MG/3ML SOPN Inject 1.2 mg into the skin.    [provider]  losartan (COZAAR)  50 MG tablet Take 50 mg by mouth daily.    [provider]  metFORMIN (GLUCOPHAGE) 1000 MG tablet metformin 1,000 mg tablet  TAKE 1 TABLET TWICE A DAY BY ORAL ROUTE FOR 90 DAYS.    [provider]  nateglinide (STARLIX) 120 MG tablet Take 120 mg by mouth 3 (three) times daily. 08/10/21   [provider]  rosuvastatin (CRESTOR) 5 MG tablet Take 5 mg by mouth daily.    [provider]  carbamazepine (TEGRETOL) 200 MG tablet 200 mg day 1, 200 mg twice daily for day 2 and 3, 400 mg twice daily for day 3 and 4. Use 400 mg twice daily, as needed, on days 5 through 7. 02/28/16 02/11/19  Joy, Shawn C, PA-C  glimepiride (AMARYL) 4 MG tablet Take 1 tablet (4 mg total) by mouth daily with breakfast. 12/23/15 02/11/19  Willodean Rosenthal, MD  lisinopril (PRINIVIL,ZESTRIL) 5 MG tablet Take 5 mg by mouth daily.  02/11/19  [provider]  sitaGLIPtin-metformin (JANUMET) 50-1000 MG per tablet Take 1 tablet by mouth 2 (two) times daily with a meal.  02/11/19  [provider]    Family  History Family History  Problem Relation Age of Onset   Hypertension Mother    Diabetes Father    Hypertension Father    Hypertension Maternal Grandmother    Hypertension Maternal Grandfather    Hypertension Paternal Grandmother    Hypertension Paternal Grandfather    Breast cancer Maternal Aunt     Social History Social History   Tobacco Use   Smoking status: Never   Smokeless tobacco: Never  Vaping Use   Vaping Use: Never used  Substance Use Topics   Alcohol use: No   Drug use: No     Allergies   Bactrim [sulfamethoxazole-trimethoprim] and Percocet [oxycodone-acetaminophen]   Review of Systems Review of Systems   Physical Exam Triage Vital Signs ED Triage Vitals  Enc Vitals Group     BP 12/07/22 1859 (!) 155/91     Pulse Rate 12/07/22 1859 (!) 107     Resp 12/07/22 1859 20     Temp 12/07/22 1859 97.8 F (36.6 C)     Temp Source 12/07/22 1859  Oral     SpO2 12/07/22 1859 97 %     Weight --      Height --      Head Circumference --      Peak Flow --      Pain Score 12/07/22 1900 0     Pain Loc --      Pain Edu? --      Excl. in GC? --    No data found.  Updated Vital Signs BP (!) 155/91 (BP Location: Left Arm)   Pulse (!) 107   Temp 97.8 F (36.6 C) (Oral)   Resp 20   LMP 10/02/2015   SpO2 97%   Visual Acuity Right Eye Distance:   Left Eye Distance:   Bilateral Distance:    Right Eye Near:   Left Eye Near:    Bilateral Near:     Physical Exam Constitutional:      Appearance: Normal appearance.  Eyes:     Extraocular Movements: Extraocular movements intact.  Abdominal:     General: Abdomen is flat. Bowel sounds are normal. There is no distension.     Palpations: Abdomen is soft.     Tenderness: There is no abdominal tenderness. There is no right CVA tenderness, left CVA tenderness or guarding.  Neurological:     Mental Status: She is alert and oriented to person, place, and time.      UC Treatments / Results  Labs (all labs ordered are listed, but only abnormal results are displayed) Labs Reviewed  POCT URINALYSIS DIP (MANUAL ENTRY) - Abnormal; Notable for the following components:      Result Value   Clarity, UA cloudy (*)    Glucose, UA =250 (*)    Blood, UA moderate (*)    Protein Ur, POC trace (*)    Urobilinogen, UA 4.0 (*)    Leukocytes, UA Large (3+) (*)    All other components within normal limits    EKG   Radiology No results found.  Procedures Procedures (including critical care time)  Medications Ordered in UC Medications - No data to display  Initial Impression / Assessment and Plan / UC Course  I have reviewed the triage vital signs and the nursing notes.  Pertinent labs & imaging results that were available during my care of the patient were reviewed by me and considered in my medical decision making (see chart for details).  Urinary frequency, urinary  urgency  Urinalysis showing leukocytes, negative for nitrates, sent for culture, as patient is symptomatic with history prophylactically prescribing antibiotic, cephalexin chosen based off urine culture from 2023 which showed Klebsiella, last result available, advise supportive care through increase fluid intake primarily through water, good hygiene and over-the-counter Azo as needed, may follow-up with urgent care or primary doctor if symptoms persist or recur or worsen Final Clinical Impressions(s) / UC Diagnoses   Final diagnoses:  Urinary frequency  Urinary urgency     Discharge Instructions      Your urinalysis shows Dyshon Philbin blood cells which help to fight infections but does not show nitrates which is an enzyme released for bacteria, your urine will be sent to the lab to determine exactly which bacteria is present, if any changes need to be made to your medications you will be notified  Begin use of Celexa and every morning and every evening for 5 days, this antibiotic was chosen based on the last urine culture seen in your chart which showed Klebsiella  You may use over-the-counter Azo to help minimize your symptoms until antibiotic removes bacteria, this medication will turn your urine orange  Increase your fluid intake through use of water  As always practice good hygiene, wiping front to back and avoidance of scented vaginal products to prevent further irritation  If symptoms continue to persist after use of medication or recur please follow-up with urgent care or your primary doctor as needed    ED Prescriptions     Medication Sig Dispense Auth. Provider   cephALEXin (KEFLEX) 500 MG capsule Take 1 capsule (500 mg total) by mouth 2 (two) times daily for 5 days. 10 capsule Valinda Hoar, NP      PDMP not reviewed this encounter.   Valinda Hoar, NP 12/07/22 1922

## 2023-05-30 NOTE — Progress Notes (Addendum)
[facility] - Clinic Note  06/05/2023   CHIEF COMPLAINT Patient presents for Retina Evaluation  HISTORY OF PRESENT ILLNESS: [name] is a 52 y.o. female who presents to the clinic today for:  HPI     Retina Evaluation   In both eyes.  I, the attending physician,  performed the HPI with the patient and updated documentation appropriately.        Comments   Patient here for Retina Evaluation. Reffered by Salvatore Decent PA-C. Patient states vision is a little blurry. Has eye strain from looking at computer all day at work. No eye pain not using drops.       Last edited by Rennis Chris, MD on 06/05/2023  5:43 PM.    Pt is here on the referral of Salvatore Decent, PA-C for DM exam, pt states she is on metformin and Mounjaro, her last A1c was 7.0 on 10.21.24, but it's been as high as 12, pt has seen Dr. Conley Rolls for routine eye exams, pt states she has noticed eye strain due to her job, she is on the computer all day, she denies any kidney or peripheral nerve problems   Referring physician: Salvatore Decent, PA-C [id number] [place],  [place] [address]  HISTORICAL INFORMATION:  Selected notes from the MEDICAL RECORD NUMBER Referred by Salvatore Decent, PA-C for DM exam LEE:  Ocular Hx- PMH-   CURRENT MEDICATIONS: No current outpatient medications on file. (Ophthalmic Drugs)   No current facility-administered medications for this visit. (Ophthalmic Drugs)   Current Outpatient Medications (Other)  Medication Sig   metFORMIN (GLUCOPHAGE) 1000 MG tablet metformin 1,000 mg tablet  TAKE 1 TABLET TWICE A DAY BY ORAL ROUTE FOR 90 DAYS.   nateglinide (STARLIX) 120 MG tablet Take 120 mg by mouth 3 (three) times daily.   rosuvastatin (CRESTOR) 5 MG tablet Take 5 mg by mouth daily.   tirzepatide (MOUNJARO) 10 MG/0.5ML Pen Inject 10 mg into the skin once a week.   liraglutide (VICTOZA) 18 MG/3ML SOPN Inject 1.2 mg into the skin. (Patient not taking: Reported on  06/05/2023)   losartan (COZAAR) 50 MG tablet Take 50 mg by mouth daily. (Patient not taking: Reported on 06/05/2023)   No current facility-administered medications for this visit. (Other)   REVIEW OF SYSTEMS: ROS   Positive for: Endocrine, Eyes Last edited by Laddie Aquas, COA on 06/05/2023  2:54 PM.     ALLERGIES Allergies  Allergen Reactions   Bactrim [Sulfamethoxazole-Trimethoprim] Hives   Percocet [Oxycodone-Acetaminophen] Itching   PAST MEDICAL HISTORY Past Medical History:  Diagnosis Date   Anemia    Diabetes mellitus    Type 2   Fibroid    Hypertension    Infection    UTI   Vaginal delivery [phone]   Vaginal Pap smear, abnormal    Past Surgical History:  Procedure Laterality Date   ABDOMINAL HYSTERECTOMY     KNEE SURGERY     x 3   ROBOTIC ASSISTED TOTAL HYSTERECTOMY WITH SALPINGECTOMY Bilateral 12/22/2015   Procedure: ROBOTIC ASSISTED TOTAL HYSTERECTOMY WITH BILATERAL SALPINGECTOMY;  Surgeon: Willodean Rosenthal, MD;  Location: [facility];  Service: Gynecology;  Laterality: Bilateral;   FAMILY HISTORY Family History  Problem Relation Age of Onset   Hypertension Mother    Diabetes Father    Hypertension Father    Hypertension Maternal Grandmother    Hypertension Maternal Grandfather    Hypertension Paternal Grandmother    Hypertension Paternal Actor  Breast cancer Maternal Aunt    SOCIAL HISTORY Social History   Tobacco Use   Smoking status: Never   Smokeless tobacco: Never  Vaping Use   Vaping status: Never Used  Substance Use Topics   Alcohol use: No   Drug use: No       OPHTHALMIC EXAM:  Base Eye Exam     Visual Acuity (Snellen - Linear)       Right Left   Dist cc 20/70 -1 20/25   Dist ph cc 20/40 NI    Correction: Glasses         Tonometry (Tonopen, 2:50 PM)       Right Left   Pressure 16 20         Pupils       Dark Light Shape React APD   Right 3 2 Round Brisk None   Left 3 2 Round Brisk None          Visual Fields (Counting fingers)       Left Right    Full Full         Extraocular Movement       Right Left    Full, Ortho Full, Ortho         Neuro/Psych     Oriented x3: Yes   Mood/Affect: Normal         Dilation     Both eyes: 1.0% Mydriacyl, 2.5% Phenylephrine @ 2:50 PM           Slit Lamp and Fundus Exam     Slit Lamp Exam       Right Left   Lids/Lashes Dermatochalasis - upper lid Dermatochalasis - upper lid   Conjunctiva/Sclera mild melanosis mild melanosis   Cornea tear film debris tear film debris   Anterior Chamber deep and clear deep and clear   Iris Round and dilated, No NVI Round and dilated, No NVI   Lens Trace cortical changes, 1+PSC 2+ Nuclear sclerosis, 2+ Cortical cataract   Anterior Vitreous mild syneresis mild syneresis         Fundus Exam       Right Left   Disc Pink and Sharp Pink and Sharp   C/D Ratio 0.5 0.4   Macula Flat, Good foveal reflex, No heme or edema Flat, Good foveal reflex, No heme or edema   Vessels attenuated, Tortuous attenuated, mild tortuosity   Periphery Attached, No heme Attached, No heme           Refraction     Wearing Rx       Sphere Cylinder Axis Add   Right -3.00 +0.25 090 +1.75   Left -3.00 Sphere  +1.75         Manifest Refraction       Sphere Cylinder Dist VA   Right      Left -2.50 Sphere 20/25-2  Unable to correct any better OD stated everything looked blurred  switching between 1 or 2          IMAGING AND PROCEDURES  Imaging and Procedures for 06/05/2023  OCT, Retina - OU - Both Eyes       Right Eye Quality was good. Central Foveal Thickness: 250. Progression has no prior data. Findings include normal foveal contour, no IRF, no SRF, vitreomacular adhesion .   Left Eye Quality was good. Central Foveal Thickness: 246. Progression has no prior data. Findings include normal foveal contour, no IRF, no SRF, vitreomacular adhesion .   Notes *Images  captured and stored  on drive  Diagnosis / Impression:  NFP, no IRF/SRF OU No DME OU  Clinical management:  See below  Abbreviations: NFP - Normal foveal profile. CME - cystoid macular edema. PED - pigment epithelial detachment. IRF - intraretinal fluid. SRF - subretinal fluid. EZ - ellipsoid zone. ERM - epiretinal membrane. ORA - outer retinal atrophy. ORT - outer retinal tubulation. SRHM - subretinal hyper-reflective material. IRHM - intraretinal hyper-reflective material           ASSESSMENT/PLAN:   ICD-10-CM   1. Diabetes mellitus type 2 without retinopathy (HCC)  E11.9 OCT, Retina - OU - Both Eyes    2. Long term (current) use of oral hypoglycemic drugs  Z79.84     3. Long-term (current) use of injectable non-insulin antidiabetic drugs  Z79.85     4. Essential hypertension  I10     5. Hypertensive retinopathy of both eyes  H35.033     6. Combined forms of age-related cataract of both eyes  H25.813      1-3. Diabetes mellitus, type 2 without retinopathy  - last A1c 7.0 (10.31.24) - The incidence, risk factors for progression, natural history and treatment options for diabetic retinopathy  were discussed with patient.   - The need for close monitoring of blood glucose, blood pressure, and serum lipids, avoiding cigarette or any type of tobacco, and the need for long term follow up was also discussed with patient. - f/u in 1 year, sooner prn  4,5. Hypertensive retinopathy OU - discussed importance of tight BP control - monitor  6. Mixed Cataract OU - The symptoms of cataract, surgical options, and treatments and risks were discussed with patient. - discussed diagnosis and progression - pt has significant PSC OD and reports significant glare symptoms - BCVA 20/40 OD - will refer to [facility] for cataract consult .  Ophthalmic Meds Ordered this visit:  No orders of the defined types were placed in this encounter.    Return in about 1 year (around 06/04/2024) for f/u DM exam, DFE,  OCT.  There are no Patient Instructions on file for this visit.  Explained the diagnoses, plan, and follow up with the patient and they expressed understanding.  Patient expressed understanding of the importance of proper follow up care.   This document serves as a record of services personally performed by Karie Chimera, MD, PhD. It was created on their behalf by Glee Arvin. Manson Passey, OA an ophthalmic technician. The creation of this record is the provider's dictation and/or activities during the visit.    Electronically signed by: Glee Arvin. Manson Passey, OA 06/05/23 5:46 PM  Karie Chimera, M.D., Ph.D. Diseases & Surgery of the Retina and Vitreous [facility] 06/05/2023  I have reviewed the above documentation for accuracy and completeness, and I agree with the above. Karie Chimera, M.D., Ph.D. 06/05/23 5:46 PM   Abbreviations: M myopia (nearsighted); A astigmatism; H hyperopia (farsighted); P presbyopia; Mrx spectacle prescription;  CTL contact lenses; OD right eye; OS left eye; OU both eyes  XT exotropia; ET esotropia; PEK punctate epithelial keratitis; PEE punctate epithelial erosions; DES dry eye syndrome; MGD meibomian gland dysfunction; ATs artificial tears; PFAT's preservative free artificial tears; NSC nuclear sclerotic cataract; PSC posterior subcapsular cataract; ERM epi-retinal membrane; PVD posterior vitreous detachment; RD retinal detachment; DM diabetes mellitus; DR diabetic retinopathy; NPDR non-proliferative diabetic retinopathy; PDR proliferative diabetic retinopathy; CSME clinically significant macular edema; DME diabetic macular edema; dbh dot  blot hemorrhages; CWS cotton wool spot; POAG primary open angle glaucoma; C/D cup-to-disc ratio; HVF humphrey visual field; GVF goldmann visual field; OCT optical coherence tomography; IOP intraocular pressure; BRVO Branch retinal vein occlusion; CRVO central retinal vein occlusion; CRAO central retinal artery occlusion;  BRAO branch retinal artery occlusion; RT retinal tear; SB scleral buckle; PPV pars plana vitrectomy; VH Vitreous hemorrhage; PRP panretinal laser photocoagulation; IVK intravitreal kenalog; VMT vitreomacular traction; MH Macular hole;  NVD neovascularization of the disc; NVE neovascularization elsewhere; AREDS age related eye disease study; ARMD age related macular degeneration; POAG primary open angle glaucoma; EBMD epithelial/anterior basement membrane dystrophy; ACIOL anterior chamber intraocular lens; IOL intraocular lens; PCIOL posterior chamber intraocular lens; Phaco/IOL phacoemulsification with intraocular lens placement; PRK photorefractive keratectomy; LASIK laser assisted in situ keratomileusis; HTN hypertension; DM diabetes mellitus; COPD chronic obstructive pulmonary disease

## 2023-06-05 ENCOUNTER — Ambulatory Visit (INDEPENDENT_AMBULATORY_CARE_PROVIDER_SITE_OTHER): Payer: No Typology Code available for payment source | Admitting: Ophthalmology

## 2023-06-05 ENCOUNTER — Encounter (INDEPENDENT_AMBULATORY_CARE_PROVIDER_SITE_OTHER): Payer: Self-pay | Admitting: Ophthalmology

## 2023-06-05 DIAGNOSIS — I10 Essential (primary) hypertension: Secondary | ICD-10-CM | POA: Diagnosis not present

## 2023-06-05 DIAGNOSIS — E119 Type 2 diabetes mellitus without complications: Secondary | ICD-10-CM | POA: Diagnosis not present

## 2023-06-05 DIAGNOSIS — H35033 Hypertensive retinopathy, bilateral: Secondary | ICD-10-CM

## 2023-06-05 DIAGNOSIS — Z7985 Long-term (current) use of injectable non-insulin antidiabetic drugs: Secondary | ICD-10-CM

## 2023-06-05 DIAGNOSIS — H25813 Combined forms of age-related cataract, bilateral: Secondary | ICD-10-CM

## 2023-06-05 DIAGNOSIS — Z7984 Long term (current) use of oral hypoglycemic drugs: Secondary | ICD-10-CM

## 2023-06-05 DIAGNOSIS — H3581 Retinal edema: Secondary | ICD-10-CM

## 2023-08-09 ENCOUNTER — Other Ambulatory Visit: Payer: Self-pay | Admitting: Physician Assistant

## 2023-08-09 DIAGNOSIS — Z1231 Encounter for screening mammogram for malignant neoplasm of breast: Secondary | ICD-10-CM

## 2023-08-17 ENCOUNTER — Ambulatory Visit: Payer: Self-pay

## 2023-08-30 ENCOUNTER — Ambulatory Visit: Payer: Self-pay

## 2023-08-31 ENCOUNTER — Ambulatory Visit
Admission: RE | Admit: 2023-08-31 | Discharge: 2023-08-31 | Disposition: A | Discharge status: Home / Self Care | Payer: Self-pay | Source: Ambulatory Visit | Attending: Physician Assistant | Admitting: Physician Assistant

## 2023-08-31 DIAGNOSIS — Z1231 Encounter for screening mammogram for malignant neoplasm of breast: Secondary | ICD-10-CM

## 2023-09-21 ENCOUNTER — Encounter: Payer: Self-pay | Admitting: Orthopaedic Surgery

## 2023-09-21 ENCOUNTER — Other Ambulatory Visit (INDEPENDENT_AMBULATORY_CARE_PROVIDER_SITE_OTHER): Payer: Self-pay

## 2023-09-21 ENCOUNTER — Ambulatory Visit: Admitting: Orthopaedic Surgery

## 2023-09-21 ENCOUNTER — Other Ambulatory Visit: Payer: Self-pay

## 2023-09-21 VITALS — Ht 62.0 in | Wt 227.0 lb

## 2023-09-21 DIAGNOSIS — M25562 Pain in left knee: Secondary | ICD-10-CM | POA: Diagnosis not present

## 2023-09-21 DIAGNOSIS — M25561 Pain in right knee: Secondary | ICD-10-CM

## 2023-09-21 DIAGNOSIS — G8929 Other chronic pain: Secondary | ICD-10-CM

## 2023-09-21 NOTE — Progress Notes (Signed)
 Office Visit Note   Patient: [name]           Date of Birth: 1971/05/06           MRN: [id number] Visit Date: 09/21/2023              Requested by: [facility] [address] [place],  [place] [address] PCP: [facility]   Assessment & Plan: Visit Diagnoses:  1. Bilateral chronic knee pain     Plan: Patient with history of patellar dislocations had a Hauser procedure on the left with tibial tubercle transfer by Dr. Myrtie Neither which is healed.  She needs some physical therapy for bilateral quad strengthening isometrics, terminal arc quad strengthening in the last 20 degrees and straight leg raising.  We discussed quad strengthening where the patellofemoral joint is not in contact to avoid exacerbating her area of wear.  At this point it does not look like she needs total knee arthroplasty and hopefully with continued weight loss with the [facility] that she started and with quadricep strengthening she would be able to make it number of years before total knee arthroplasty has to be considered.  Patient can follow-up in 7 weeks with Dr. August Saucer after therapy.  Follow-Up Instructions: No follow-ups on file.   Orders:  Orders Placed This Encounter  Procedures   XR KNEE 3 VIEW LEFT   XR KNEE 3 VIEW RIGHT   No orders of the defined types were placed in this encounter.     Procedures: No procedures performed   Clinical Data: No additional findings.   Subjective: Chief Complaint  Patient presents with   Right Knee - Pain   Left Knee - Pain    HPI 53 year old female with bilateral knee pain.  She has had previous arthroscopic surgeries and then had patellar realignment procedure with arthrotomy long anterior incision with patellar tubercle transfer on the left knee.  She states she still has to be careful and sometimes feels like the kneecap will shift but she has not had any dislocations since  surgery with Dr. Myrtie Neither back in about 2010 or 2011.  She has used ice anti-inflammatories.  She has been ambulating with a cane and somehow feels like her knees are unsteady difficulty getting from sitting standing she avoids going down stairs and hills.  When she is upright she does better unless she misses a step.  She is on Mounjaro now and is losing some weight.  Diabetes on Glucophage.  She also has hypertension.  Review of Systems note positive for morbid obesity BMI 41.  On oral medication Glucophage for diabetes    Objective: Vital Signs: Ht 5\' 2"  (1.575 m)   Wt 227 lb (103 kg)   LMP 10/02/2015   BMI 41.52 kg/m   Physical Exam Constitutional:      Appearance: She is well-developed.  HENT:     Head: Normocephalic.     Right Ear: External ear normal.     Left Ear: External ear normal. There is no impacted cerumen.  Eyes:     Pupils: Pupils are equal, round, and reactive to light.  Neck:     Thyroid: No thyromegaly.     Trachea: No tracheal deviation.  Cardiovascular:     Rate and Rhythm: Normal rate.  Pulmonary:     Effort: Pulmonary effort is normal.  Abdominal:     Palpations: Abdomen is soft.  Musculoskeletal:  Cervical back: No rigidity.  Skin:    General: Skin is warm and dry.  Neurological:     Mental Status: She is alert and oriented to person, place, and time.  Psychiatric:        Behavior: Behavior normal.     Ortho Exam  Specialty Comments:  No specialty comments available.  Imaging: No results found.   PMFS History: Patient Active Problem List   Diagnosis Date Noted   Fibroids 12/22/2015   Post-operative state 12/22/2015   DUB (dysfunctional uterine bleeding) 10/12/2015   Obesity 03/01/2012   Diabetes mellitus (HCC) 10/26/2011   Past Medical History:  Diagnosis Date   Anemia    Diabetes mellitus    Type 2   Fibroid    Hypertension    Infection    UTI   Vaginal delivery [phone]   Vaginal Pap smear, abnormal      Family History  Problem Relation Age of Onset   Hypertension Mother    Diabetes Father    Hypertension Father    Hypertension Maternal Grandmother    Hypertension Maternal Grandfather    Hypertension Paternal Grandmother    Hypertension Paternal Grandfather    Breast cancer Maternal Aunt     Past Surgical History:  Procedure Laterality Date   ABDOMINAL HYSTERECTOMY     KNEE SURGERY     x 3   ROBOTIC ASSISTED TOTAL HYSTERECTOMY WITH SALPINGECTOMY Bilateral 12/22/2015   Procedure: ROBOTIC ASSISTED TOTAL HYSTERECTOMY WITH BILATERAL SALPINGECTOMY;  Surgeon: Willodean Rosenthal, MD;  Location: [facility];  Service: Gynecology;  Laterality: Bilateral;   Social History   Occupational History   Not on file  Tobacco Use   Smoking status: Never   Smokeless tobacco: Never  Vaping Use   Vaping status: Never Used  Substance and Sexual Activity   Alcohol use: No   Drug use: No   Sexual activity: Yes    Birth control/protection: Post-menopausal

## 2023-09-28 ENCOUNTER — Other Ambulatory Visit: Payer: Self-pay

## 2023-09-28 ENCOUNTER — Encounter: Payer: Self-pay | Admitting: Physical Therapy

## 2023-09-28 ENCOUNTER — Ambulatory Visit: Attending: Orthopaedic Surgery | Admitting: Physical Therapy

## 2023-09-28 DIAGNOSIS — M25562 Pain in left knee: Secondary | ICD-10-CM | POA: Diagnosis not present

## 2023-09-28 DIAGNOSIS — M25561 Pain in right knee: Secondary | ICD-10-CM | POA: Diagnosis not present

## 2023-09-28 DIAGNOSIS — G8929 Other chronic pain: Secondary | ICD-10-CM | POA: Diagnosis not present

## 2023-09-28 DIAGNOSIS — M6281 Muscle weakness (generalized): Secondary | ICD-10-CM | POA: Insufficient documentation

## 2023-09-28 DIAGNOSIS — R2689 Other abnormalities of gait and mobility: Secondary | ICD-10-CM | POA: Insufficient documentation

## 2023-09-28 NOTE — Therapy (Addendum)
 OUTPATIENT PHYSICAL THERAPY LOWER EXTREMITY EVALUATION   Patient Name: [name] MRN: [id number] DOB:13-Aug-1970, 53 y.o., female Today's Date: 09/28/2023  END OF SESSION:  PT End of Session - 09/28/23 1351     Visit Number 1    Number of Visits 13    Date for PT Re-Evaluation 11/09/23    PT Start Time 1355    PT Stop Time 1435    PT Time Calculation (min) 40 min    Activity Tolerance Patient tolerated treatment well    Behavior During Therapy [facility] for tasks assessed/performed             Past Medical History:  Diagnosis Date   Anemia    Diabetes mellitus    Type 2   Fibroid    Hypertension    Infection    UTI   Vaginal delivery [phone]   Vaginal Pap smear, abnormal    Past Surgical History:  Procedure Laterality Date   ABDOMINAL HYSTERECTOMY     KNEE SURGERY     x 3   ROBOTIC ASSISTED TOTAL HYSTERECTOMY WITH SALPINGECTOMY Bilateral 12/22/2015   Procedure: ROBOTIC ASSISTED TOTAL HYSTERECTOMY WITH BILATERAL SALPINGECTOMY;  Surgeon: Willodean Rosenthal, MD;  Location: [facility];  Service: Gynecology;  Laterality: Bilateral;   Patient Active Problem List   Diagnosis Date Noted   Fibroids 12/22/2015   Post-operative state 12/22/2015   DUB (dysfunctional uterine bleeding) 10/12/2015   Obesity 03/01/2012   Diabetes mellitus (HCC) 10/26/2011    PCP: [facility]  REFERRING PROVIDER: Eldred Manges, MD  REFERRING DIAG: M25.561,M25.562,G89.29 (ICD-10-CM) - Bilateral chronic knee pain   Rationale for Evaluation and Treatment: Rehabilitation  THERAPY DIAG:  Muscle weakness (generalized)  Other abnormalities of gait and mobility  PERTINENT HISTORY: Dm2,  knee surgery 33 years ago, a Physiological scientist procedure with L tibial tubercle transfer  WEIGHT BEARING RESTRICTIONS: No  FALLS:  Has patient fallen in last 6 months? No  LIVING ENVIRONMENT: Lives with: lives with their family Lives in: House/apartment Stairs: No Has  following equipment at home: Single point cane, Quad cane small base, and Walker - 2 wheeled  OCCUPATION: not currently working   PRECAUTIONS: None ---------------------------------------------------------------------------------------------  SUBJECTIVE:   SUBJECTIVE STATEMENT: Eval statement 09/28/2023: pt describes that she isnt necessarily having pain, its more of a feeling of weakness and general feeling that the knees will give out, like she doesn't trust them. Gets that feeling with prolonged walking. 0/10 pain currently, onset past 33 years, N/T   RED FLAGS: None   PLOF: Independent  PATIENT GOALS: get better at walking.  NEXT MD VISIT: 7 weeks from 09/21/2023 ---------------------------------------------------------------------------------------------  OBJECTIVE:  Note: Objective measures were completed at Evaluation unless otherwise noted.  DIAGNOSTIC FINDINGS: Impression: Patellofemoral degenerative changes.  Patella seems centrally  located on AP image.   PATIENT SURVEYS:  LEFS 40/80  COGNITION: Overall cognitive status: Within functional limits for tasks assessed     SENSATION: WFL   MUSCLE LENGTH: Hamstrings: Right 0 deg; Left -50 deg   POSTURE: rounded shoulders  PALPATION: No TTP  LOWER EXTREMITY ROM:  Passive ROM Right eval Left eval  Hip flexion [facility]  Hip extension [facility]  Hip abduction [facility]  Hip adduction    Hip internal rotation [facility]  Hip external rotation [facility]  Knee flexion [facility] WFL  Knee extension WFL  Fearful of L knee ext  Ankle dorsiflexion    Ankle plantarflexion    Ankle inversion  Ankle eversion     (Blank rows = not tested)  ! Indicates pain with testing  LOWER EXTREMITY MMT:  MMT Right eval Left eval  Hip flexion 4- 4-  Hip extension 4 4  Hip abduction 4- 4-  Hip adduction    Hip internal rotation    Hip external rotation    Knee flexion 5 4+  Knee extension 5 3+  Ankle dorsiflexion    Ankle  plantarflexion    Ankle inversion    Ankle eversion     (Blank rows = not tested)  ! Indicates pain with testing  FUNCTIONAL TESTS:  2 minute walk test: 220ft X5 SLR: (R, No extensor lag) (L, 50d extensor lag)   GAIT: Distance walked: 269ft Assistive device utilized: None Level of assistance: SBA Comments: decreased L quad activatoin, B trunk lean during stance, indicating functional glute med weakness.                                                                                                                                [facility] Adult PT Treatment:                                                DATE: 09/28/2023   Self Care: Pt education, detailed below POC discussion    PATIENT EDUCATION:  Education details: Pt received education regarding HEP performance, ADL performance, functional activity tolerance, impairment education, appropriate performance of therapeutic activities. Person educated: Patient Education method: Medical illustrator Education comprehension: verbalized understanding and returned demonstration  HOME EXERCISE PROGRAM: Access Code: [email or web address] URL: [email or web address] Date: 09/28/2023 Prepared by: Sheliah Plane  Exercises - Supine Hamstring Stretch with Strap  - 1 x daily - 7 x weekly - 2 sets - 1 reps - 54m hold - Active Straight Leg Raise with Quad Set  - 1 x daily - 7 x weekly - 2-3 sets - 8 reps - 4s hold - Supine Bridge with Resistance Band  - 1 x daily - 7 x weekly - 3 sets - 8 reps - 5s hold - Standing March with Counter Support  - 1 x daily - 7 x weekly - 2-3 sets - 10 reps - 3s hold - Standing Hip Abduction with Unilateral Counter Support  - 1 x daily - 7 x weekly - 2-3 sets - 10 reps - 3s hold ---------------------------------------------------------------------------------------------  ASSESSMENT:  CLINICAL IMPRESSION: Eval impression (09/28/2023): Pt. attended today's physical therapy session for evaluation of B  knee pain. Pt has complaints of 0/10 pain with a general feeling of weakness onset 33 years. Pt has notable deficits with L knee extensor lag, Confidence during ambulation, global BLE hip/knee strength, and gait speed/quality.  Pt would benefit from therapeutic focus on B hip/knee strengthening, progressive activity for confidence in L knee during ambulation and  functional activity, L quadriceps motor recruitment, and gait training .  Treatment performed today focused on pt education detailed in obj. Pt demonstrated great understanding of education provided. required minimal verbal/tactile cues and stand by assistance for appropriate performance with today's activities. Pt requires the intervention of skilled outpatient physical therapy to address the aforementioned deficits and progress towards a functional level in line with therapeutic goals.   OBJECTIVE IMPAIRMENTS: Abnormal gait, decreased balance, decreased endurance, difficulty walking, decreased strength, and improper body mechanics.   ACTIVITY LIMITATIONS: lifting, bending, standing, squatting, stairs, and locomotion level  PARTICIPATION LIMITATIONS: interpersonal relationship, community activity, and occupation  PERSONAL FACTORS: Behavior pattern, Fitness, Past/current experiences, and Time since onset of injury/illness/exacerbation are also affecting patient's functional outcome.   REHAB POTENTIAL: Good  CLINICAL DECISION MAKING: Stable/uncomplicated  EVALUATION COMPLEXITY: Low   GOALS: Goals reviewed with patient? YES  SHORT TERM GOALS: Target date: 10/19/2023  Pt will be independent with administered HEP to demonstrate the competency necessary for long term managemnet of symptoms at home.  Baseline: Goal status: INITIAL    LONG TERM GOALS: Target date: 11/09/2023  Pt. Will achieve a LEFS score of at least 50 as to demonstrate improvement in self-perceived functional ability with daily activities.  Baseline: 40 Goal  status: INITIAL  2.  Pt will improve Global Hip/knee strength to a 4+/5 to demonstrate improvement in strength for quality of motion and activity performance.  Baseline: see obj chart Goal status: INITIAL  3.  Pt will improve by ambulating 368ft with LRAD and independence to demonstrate improved gait quality, speed, and confidence necessary for community ambulation Baseline: 261ft, SBA Goal status: INITIAL  4.  Pt will improve x5 SLR on L side to have no extensor lag to demonstrate improved motor recruitment and functional strength of L quadriceps. Baseline: 50d Goal status: INITIAL   --------------------------------------------------------------------------------------------- PLAN:  PT FREQUENCY: 1-2x/week  PT DURATION: 6 weeks  PLANNED INTERVENTIONS: 97110-Therapeutic exercises, 97530- Therapeutic activity, 97112- Neuromuscular re-education, 97535- Self Care, 16109- Manual therapy, [phone]- Gait training, Patient/Family education, Balance training, Stair training, Dry Needling, and Joint mobilization  PLAN FOR NEXT SESSION: Review HEP, Begin POC as detailed in the assessment   Sheliah Plane, PT, DPT 09/28/2023, 2:36 PM  For all possible CPT codes, reference the Planned Interventions line above.     Check all conditions that are expected to impact treatment: {Conditions expected to impact treatment:None of these apply   If treatment provided at initial evaluation, no treatment charged due to lack of authorization.

## 2023-10-04 ENCOUNTER — Encounter: Payer: Self-pay | Admitting: Physical Therapy

## 2023-10-04 ENCOUNTER — Ambulatory Visit: Admitting: Physical Therapy

## 2023-10-04 DIAGNOSIS — R2689 Other abnormalities of gait and mobility: Secondary | ICD-10-CM

## 2023-10-04 DIAGNOSIS — M6281 Muscle weakness (generalized): Secondary | ICD-10-CM | POA: Diagnosis not present

## 2023-10-04 NOTE — Therapy (Signed)
 OUTPATIENT PHYSICAL THERAPY LOWER EXTREMITY EVALUATION   Patient Name: [name] MRN: [id number] DOB:December 27, 1970, 53 y.o., female Today's Date: 10/04/2023  END OF SESSION:  PT End of Session - 10/04/23 1616     Visit Number 2    Number of Visits 13    Date for PT Re-Evaluation 11/09/23    PT Start Time 1616    PT Stop Time 1656    PT Time Calculation (min) 40 min              Past Medical History:  Diagnosis Date   Anemia    Diabetes mellitus    Type 2   Fibroid    Hypertension    Infection    UTI   Vaginal delivery [phone]   Vaginal Pap smear, abnormal    Past Surgical History:  Procedure Laterality Date   ABDOMINAL HYSTERECTOMY     KNEE SURGERY     x 3   ROBOTIC ASSISTED TOTAL HYSTERECTOMY WITH SALPINGECTOMY Bilateral 12/22/2015   Procedure: ROBOTIC ASSISTED TOTAL HYSTERECTOMY WITH BILATERAL SALPINGECTOMY;  Surgeon: Willodean Rosenthal, MD;  Location: [facility];  Service: Gynecology;  Laterality: Bilateral;   Patient Active Problem List   Diagnosis Date Noted   Fibroids 12/22/2015   Post-operative state 12/22/2015   DUB (dysfunctional uterine bleeding) 10/12/2015   Obesity 03/01/2012   Diabetes mellitus (HCC) 10/26/2011    PCP: [facility]  REFERRING PROVIDER: Eldred Manges, MD  REFERRING DIAG: M25.561,M25.562,G89.29 (ICD-10-CM) - Bilateral chronic knee pain   Rationale for Evaluation and Treatment: Rehabilitation  THERAPY DIAG:  Muscle weakness (generalized)  Other abnormalities of gait and mobility  PERTINENT HISTORY: Dm2,  knee surgery 33 years ago, a Physiological scientist procedure with L tibial tubercle transfer  WEIGHT BEARING RESTRICTIONS: No  FALLS:  Has patient fallen in last 6 months? No  LIVING ENVIRONMENT: Lives with: lives with their family Lives in: House/apartment Stairs: No Has following equipment at home: Single point cane, Quad cane small base, and Walker - 2 wheeled  OCCUPATION: not  currently working   PRECAUTIONS: None ---------------------------------------------------------------------------------------------  SUBJECTIVE:   SUBJECTIVE STATEMENT: Stated that she's feeling good today, noticing that she can alrady do more with the L knee, feelings like its much looser and stronger Eval statement 09/28/2023: pt describes that she isnt necessarily having pain, its more of a feeling of weakness and general feeling that the knees will give out, like she doesn't trust them. Gets that feeling with prolonged walking. 0/10 pain currently, onset past 33 years, N/T   RED FLAGS: None   PLOF: Independent  PATIENT GOALS: get better at walking.  NEXT MD VISIT: 7 weeks from 09/21/2023 ---------------------------------------------------------------------------------------------  OBJECTIVE:  Note: Objective measures were completed at Evaluation unless otherwise noted.  DIAGNOSTIC FINDINGS: Impression: Patellofemoral degenerative changes.  Patella seems centrally  located on AP image.   PATIENT SURVEYS:  LEFS 40/80  COGNITION: Overall cognitive status: Within functional limits for tasks assessed     SENSATION: WFL   MUSCLE LENGTH: Hamstrings: Right 0 deg; Left -50 deg   POSTURE: rounded shoulders  PALPATION: No TTP  LOWER EXTREMITY ROM:  Passive ROM Right eval Left eval  Hip flexion [facility]  Hip extension [facility]  Hip abduction [facility]  Hip adduction    Hip internal rotation [facility]  Hip external rotation [facility]  Knee flexion [facility] WFL  Knee extension WFL  Fearful of L knee ext  Ankle dorsiflexion    Ankle  plantarflexion    Ankle inversion    Ankle eversion     (Blank rows = not tested)  ! Indicates pain with testing  LOWER EXTREMITY MMT:  MMT Right eval Left eval  Hip flexion 4- 4-  Hip extension 4 4  Hip abduction 4- 4-  Hip adduction    Hip internal rotation    Hip external rotation    Knee flexion 5 4+  Knee extension 5 3+  Ankle  dorsiflexion    Ankle plantarflexion    Ankle inversion    Ankle eversion     (Blank rows = not tested)  ! Indicates pain with testing  FUNCTIONAL TESTS:  2 minute walk test: 214ft X5 SLR: (R, No extensor lag) (L, 50d extensor lag)   GAIT: Distance walked: 227ft Assistive device utilized: None Level of assistance: SBA Comments: decreased L quad activatoin, B trunk lean during stance, indicating functional glute med weakness.    [facility] Adult PT Treatment:                                                DATE: 10/04/2023  Neuromuscular re-ed: Machine quad isometric 3x30s ea. At mid range knee ext Quad set with elevated ankle 2x15, 10s hold Therapeutic Activity: Nustep 5' lvl 5 for activity tolerance Lat Step up/down 2" step  2x6, control eccentric step down, toe touch on RLE to assist PRN                                                                                                                              [facility] Adult PT Treatment:                                                DATE: 10/04/2023   Self Care: Pt education, detailed below POC discussion    PATIENT EDUCATION:  Education details: Pt received education regarding HEP performance, ADL performance, functional activity tolerance, impairment education, appropriate performance of therapeutic activities. Person educated: Patient Education method: Medical illustrator Education comprehension: verbalized understanding and returned demonstration  HOME EXERCISE PROGRAM: Access Code: [email or web address] URL: [email or web address] Date: 09/28/2023 Prepared by: Sheliah Plane  Exercises - Supine Hamstring Stretch with Strap  - 1 x daily - 7 x weekly - 2 sets - 1 reps - 3m hold - Active Straight Leg Raise with Quad Set  - 1 x daily - 7 x weekly - 2-3 sets - 8 reps - 4s hold - Supine Bridge with Resistance Band  - 1 x daily - 7 x weekly - 3 sets - 8 reps - 5s hold - Standing March with Counter Support  - 1 x  daily - 7 x weekly -  2-3 sets - 10 reps - 3s hold - Standing Hip Abduction with Unilateral Counter Support  - 1 x daily - 7 x weekly - 2-3 sets - 10 reps - 3s hold ---------------------------------------------------------------------------------------------  ASSESSMENT:  CLINICAL IMPRESSION: Pt attended physical therapy session for continuation of treatment regarding B knee pain. Today's treatment focused on improvement of  quadriceps recruitment, global hip/knee strength and functional movement tolerance such as stair ambulation. Pt showed  great tolerance to administered treatment with no adverse effects by the end of session. Improvement was noted with quad recruitment and functional movement tolerance by the end of today's session. Pt required moderate verbal/tactile cuing alongside minimal physical assistance for safe and appropriate performance of today's standing activities. Continue with therapeutic focus on quadriceps recruitment, global hip/knee strength and functional movement tolerance such as stair ambulation and gait quality over complex terrain.   Eval impression (09/28/2023): Pt. attended today's physical therapy session for evaluation of B knee pain. Pt has complaints of 0/10 pain with a general feeling of weakness onset 33 years. Pt has notable deficits with L knee extensor lag, Confidence during ambulation, global BLE hip/knee strength, and gait speed/quality.  Pt would benefit from therapeutic focus on B hip/knee strengthening, progressive activity for confidence in L knee during ambulation and functional activity, L quadriceps motor recruitment, and gait training .  Treatment performed today focused on pt education detailed in obj. Pt demonstrated great understanding of education provided. required minimal verbal/tactile cues and stand by assistance for appropriate performance with today's activities. Pt requires the intervention of skilled outpatient physical therapy to address the  aforementioned deficits and progress towards a functional level in line with therapeutic goals.   OBJECTIVE IMPAIRMENTS: Abnormal gait, decreased balance, decreased endurance, difficulty walking, decreased strength, and improper body mechanics.   ACTIVITY LIMITATIONS: lifting, bending, standing, squatting, stairs, and locomotion level  PARTICIPATION LIMITATIONS: interpersonal relationship, community activity, and occupation  PERSONAL FACTORS: Behavior pattern, Fitness, Past/current experiences, and Time since onset of injury/illness/exacerbation are also affecting patient's functional outcome.   REHAB POTENTIAL: Good  CLINICAL DECISION MAKING: Stable/uncomplicated  EVALUATION COMPLEXITY: Low   GOALS: Goals reviewed with patient? YES  SHORT TERM GOALS: Target date: 10/19/2023  Pt will be independent with administered HEP to demonstrate the competency necessary for long term managemnet of symptoms at home.  Baseline: Goal status: INITIAL    LONG TERM GOALS: Target date: 11/09/2023  Pt. Will achieve a LEFS score of at least 50 as to demonstrate improvement in self-perceived functional ability with daily activities.  Baseline: 40 Goal status: INITIAL  2.  Pt will improve Global Hip/knee strength to a 4+/5 to demonstrate improvement in strength for quality of motion and activity performance.  Baseline: see obj chart Goal status: INITIAL  3.  Pt will improve by ambulating 379ft with LRAD and independence to demonstrate improved gait quality, speed, and confidence necessary for community ambulation Baseline: 257ft, SBA Goal status: INITIAL  4.  Pt will improve x5 SLR on L side to have no extensor lag to demonstrate improved motor recruitment and functional strength of L quadriceps. Baseline: 50d Goal status: INITIAL   --------------------------------------------------------------------------------------------- PLAN:  PT FREQUENCY: 1-2x/week  PT DURATION: 6  weeks  PLANNED INTERVENTIONS: 97110-Therapeutic exercises, 97530- Therapeutic activity, 97112- Neuromuscular re-education, 97535- Self Care, 16109- Manual therapy, [phone]- Gait training, Patient/Family education, Balance training, Stair training, Dry Needling, and Joint mobilization  PLAN FOR NEXT SESSION: Continue with therapeutic focus on quadriceps recruitment, global hip/knee strength and functional movement tolerance such  as stair ambulation and gait quality over complex terrain.   Sheliah Plane, PT, DPT 10/04/2023, 4:58 PM  For all possible CPT codes, reference the Planned Interventions line above.     Check all conditions that are expected to impact treatment: {Conditions expected to impact treatment:None of these apply   If treatment provided at initial evaluation, no treatment charged due to lack of authorization.

## 2023-10-11 ENCOUNTER — Ambulatory Visit: Admitting: Physical Therapy

## 2023-10-11 DIAGNOSIS — M6281 Muscle weakness (generalized): Secondary | ICD-10-CM | POA: Diagnosis not present

## 2023-10-11 DIAGNOSIS — R2689 Other abnormalities of gait and mobility: Secondary | ICD-10-CM

## 2023-10-11 NOTE — Therapy (Cosign Needed)
 OUTPATIENT PHYSICAL THERAPY LOWER EXTREMITY EVALUATION   Patient Name: [name] MRN: [id number] DOB:13-Dec-1970, 53 y.o., female Today's Date: 10/11/2023  END OF SESSION:  PT End of Session - 10/11/23 1652     Visit Number 3    Number of Visits 13    Date for PT Re-Evaluation 11/09/23    PT Start Time 1530    PT Stop Time 1615    PT Time Calculation (min) 45 min    Activity Tolerance Patient tolerated treatment well    Behavior During Therapy [facility] for tasks assessed/performed               Past Medical History:  Diagnosis Date   Anemia    Diabetes mellitus    Type 2   Fibroid    Hypertension    Infection    UTI   Vaginal delivery [phone]   Vaginal Pap smear, abnormal    Past Surgical History:  Procedure Laterality Date   ABDOMINAL HYSTERECTOMY     KNEE SURGERY     x 3   ROBOTIC ASSISTED TOTAL HYSTERECTOMY WITH SALPINGECTOMY Bilateral 12/22/2015   Procedure: ROBOTIC ASSISTED TOTAL HYSTERECTOMY WITH BILATERAL SALPINGECTOMY;  Surgeon: Lenord Radon, MD;  Location: [facility];  Service: Gynecology;  Laterality: Bilateral;   Patient Active Problem List   Diagnosis Date Noted   Fibroids 12/22/2015   Post-operative state 12/22/2015   DUB (dysfunctional uterine bleeding) 10/12/2015   Obesity 03/01/2012   Diabetes mellitus (HCC) 10/26/2011    PCP: [facility]  REFERRING PROVIDER: Adah Acron, MD  REFERRING DIAG: M25.561,M25.562,G89.29 (ICD-10-CM) - Bilateral chronic knee pain   Rationale for Evaluation and Treatment: Rehabilitation  THERAPY DIAG:  Muscle weakness (generalized)  Other abnormalities of gait and mobility  PERTINENT HISTORY: Dm2,  knee surgery 33 years ago, a Physiological scientist procedure with L tibial tubercle transfer  WEIGHT BEARING RESTRICTIONS: No  FALLS:  Has patient fallen in last 6 months? No  LIVING ENVIRONMENT: Lives with: lives with their family Lives in: House/apartment Stairs:  No Has following equipment at home: Single point cane, Quad cane small base, and Walker - 2 wheeled  OCCUPATION: not currently working   PRECAUTIONS: None ---------------------------------------------------------------------------------------------  SUBJECTIVE:   SUBJECTIVE STATEMENT: Pt attended today's session with reports of  continued "stiffness" in L knee Pt stated that they have maintained good compliance with current HEP.      Eval statement 09/28/2023: pt describes that she isnt necessarily having pain, its more of a feeling of weakness and general feeling that the knees will give out, like she doesn't trust them. Gets that feeling with prolonged walking. 0/10 pain currently, onset past 33 years, N/T   RED FLAGS: None   PLOF: Independent  PATIENT GOALS: get better at walking.  NEXT MD VISIT: 7 weeks from 09/21/2023 ---------------------------------------------------------------------------------------------  OBJECTIVE:  Note: Objective measures were completed at Evaluation unless otherwise noted.  DIAGNOSTIC FINDINGS: Impression: Patellofemoral degenerative changes.  Patella seems centrally  located on AP image.   PATIENT SURVEYS:  LEFS 40/80  COGNITION: Overall cognitive status: Within functional limits for tasks assessed     SENSATION: WFL   MUSCLE LENGTH: Hamstrings: Right 0 deg; Left -50 deg   POSTURE: rounded shoulders  PALPATION: No TTP  LOWER EXTREMITY ROM:  Passive ROM Right eval Left eval  Hip flexion [facility]  Hip extension [facility]  Hip abduction [facility]  Hip adduction    Hip internal rotation [facility]  Hip external  rotation [facility]  Knee flexion [facility] WFL  Knee extension WFL  Fearful of L knee ext  Ankle dorsiflexion    Ankle plantarflexion    Ankle inversion    Ankle eversion     (Blank rows = not tested)  ! Indicates pain with testing  LOWER EXTREMITY MMT:  MMT Right eval Left eval  Hip flexion 4- 4-  Hip extension 4 4   Hip abduction 4- 4-  Hip adduction    Hip internal rotation    Hip external rotation    Knee flexion 5 4+  Knee extension 5 3+  Ankle dorsiflexion    Ankle plantarflexion    Ankle inversion    Ankle eversion     (Blank rows = not tested)  ! Indicates pain with testing  FUNCTIONAL TESTS:  2 minute walk test: 222ft X5 SLR: (R, No extensor lag) (L, 50d extensor lag)   GAIT: Distance walked: 274ft Assistive device utilized: None Level of assistance: SBA Comments: decreased L quad activatoin, B trunk lean during stance, indicating functional glute med weakness.   [facility] Adult PT Treatment:                                                DATE: 10/11/2023  Neuromuscular re-ed: [place] ESTIM intramuscular w/dry needles Quad set 10' Setup 5' Therapeutic Activity: Nustep 5' lvl 5 for activity tolerance Backwards treadmill walk 5', quad activation, activity tolerance Lat Step up/down 2" step  2x8,  control eccentric step down, toe touch on RLE to assist PRN Step up and over 2'' box on LLE 2x5  Trigger Point Dry Needling  Initial Treatment: Pt instructed on Dry Needling rational, procedures, and possible side effects. Pt instructed to expect mild to moderate muscle soreness later in the day and/or into the next day.  Pt instructed in methods to reduce muscle soreness. Pt instructed to continue prescribed HEP. Patient was educated on signs and symptoms of infection and other risk factors and advised to seek medical attention should they occur.  Patient verbalized understanding of these instructions and education.   Patient Verbal Consent Given: Yes Education Handout Provided: Yes Muscles Treated: Quadriceps muscle group Electrical Stimulation Performed: Yes, Parameters: frequency at 100, reduced to 20 for pt comfort, intensity to tolerance. Treatment Response/Outcome: muscle twitch response, quad activation with [facility] Adult PT Treatment:                                                 DATE: 10/04/2023  Neuromuscular re-ed: Machine quad isometric 3x30s ea. At mid range knee ext Quad set with elevated ankle 2x15, 10s hold Therapeutic Activity: Nustep 5' lvl 5 for activity tolerance Lat Step up/down 2" step  2x6, control eccentric step down, toe touch on RLE to assist PRN    PATIENT EDUCATION:  Education details: Pt received education regarding HEP performance, ADL performance, functional activity tolerance, impairment education, appropriate performance of therapeutic activities. Person educated: Patient Education method: Medical illustrator Education comprehension: verbalized understanding and returned demonstration  HOME EXERCISE PROGRAM: Access Code: [email or web address] URL: [email or web address] Date: 09/28/2023 Prepared by: Albesa Huguenin  Exercises - Supine Hamstring Stretch with Strap  - 1 x daily -  7 x weekly - 2 sets - 1 reps - 78m hold - Active Straight Leg Raise with Quad Set  - 1 x daily - 7 x weekly - 2-3 sets - 8 reps - 4s hold - Supine Bridge with Resistance Band  - 1 x daily - 7 x weekly - 3 sets - 8 reps - 5s hold - Standing March with Counter Support  - 1 x daily - 7 x weekly - 2-3 sets - 10 reps - 3s hold - Standing Hip Abduction with Unilateral Counter Support  - 1 x daily - 7 x weekly - 2-3 sets - 10 reps - 3s hold ---------------------------------------------------------------------------------------------  ASSESSMENT:  CLINICAL IMPRESSION: Pt attended physical therapy session for continuation of treatment regarding B knee pain. Today's treatment focused on improvement of  L quadriceps recruitment and global hip/knee strengthening. Pt showed  great tolerance to administered treatment with no adverse effects by the end of session. Pt required moderate verbal/tactile cuing alongside minimal physical assistance for safe and appropriate performance of today's activities. Continue with therapeutic focus on L quad  recruitment, B functional strength, and ambulation quality.   Eval impression (09/28/2023): Pt. attended today's physical therapy session for evaluation of B knee pain. Pt has complaints of 0/10 pain with a general feeling of weakness onset 33 years. Pt has notable deficits with L knee extensor lag, Confidence during ambulation, global BLE hip/knee strength, and gait speed/quality.  Pt would benefit from therapeutic focus on B hip/knee strengthening, progressive activity for confidence in L knee during ambulation and functional activity, L quadriceps motor recruitment, and gait training .  Treatment performed today focused on pt education detailed in obj. Pt demonstrated great understanding of education provided. required minimal verbal/tactile cues and stand by assistance for appropriate performance with today's activities. Pt requires the intervention of skilled outpatient physical therapy to address the aforementioned deficits and progress towards a functional level in line with therapeutic goals.   OBJECTIVE IMPAIRMENTS: Abnormal gait, decreased balance, decreased endurance, difficulty walking, decreased strength, and improper body mechanics.   ACTIVITY LIMITATIONS: lifting, bending, standing, squatting, stairs, and locomotion level  PARTICIPATION LIMITATIONS: interpersonal relationship, community activity, and occupation  PERSONAL FACTORS: Behavior pattern, Fitness, Past/current experiences, and Time since onset of injury/illness/exacerbation are also affecting patient's functional outcome.   REHAB POTENTIAL: Good  CLINICAL DECISION MAKING: Stable/uncomplicated  EVALUATION COMPLEXITY: Low   GOALS: Goals reviewed with patient? YES  SHORT TERM GOALS: Target date: 10/19/2023  Pt will be independent with administered HEP to demonstrate the competency necessary for long term managemnet of symptoms at home.  Baseline: Goal status: INITIAL    LONG TERM GOALS: Target date: 11/09/2023  Pt.  Will achieve a LEFS score of at least 50 as to demonstrate improvement in self-perceived functional ability with daily activities.  Baseline: 40 Goal status: INITIAL  2.  Pt will improve Global Hip/knee strength to a 4+/5 to demonstrate improvement in strength for quality of motion and activity performance.  Baseline: see obj chart Goal status: INITIAL  3.  Pt will improve by ambulating 368ft with LRAD and independence to demonstrate improved gait quality, speed, and confidence necessary for community ambulation Baseline: 268ft, SBA Goal status: INITIAL  4.  Pt will improve x5 SLR on L side to have no extensor lag to demonstrate improved motor recruitment and functional strength of L quadriceps. Baseline: 50d Goal status: INITIAL   --------------------------------------------------------------------------------------------- PLAN:  PT FREQUENCY: 1-2x/week  PT DURATION: 6 weeks  PLANNED INTERVENTIONS: 97110-Therapeutic exercises, 97530-  Therapeutic activity, [id number]- Neuromuscular re-education, [phone]- Self Care, 19147- Manual therapy, [phone]- Gait training, Patient/Family education, Balance training, Stair training, Dry Needling, and Joint mobilization  PLAN FOR NEXT SESSION: Continue with therapeutic focus on quadriceps recruitment, global hip/knee strength and functional movement tolerance such as stair ambulation and gait quality over complex terrain.   Albesa Huguenin, PT, DPT 10/11/2023, 4:54 PM  For all possible CPT codes, reference the Planned Interventions line above.     Check all conditions that are expected to impact treatment: {Conditions expected to impact treatment:None of these apply   If treatment provided at initial evaluation, no treatment charged due to lack of authorization.    Kristoffer Leamon PT, DPT, LAT, ATC  10/12/23  2:23 PM

## 2023-10-18 ENCOUNTER — Ambulatory Visit: Admitting: Physical Therapy

## 2023-10-18 DIAGNOSIS — M6281 Muscle weakness (generalized): Secondary | ICD-10-CM

## 2023-10-18 DIAGNOSIS — R2689 Other abnormalities of gait and mobility: Secondary | ICD-10-CM

## 2023-10-18 NOTE — Therapy (Addendum)
 OUTPATIENT PHYSICAL THERAPY LOWER EXTREMITY EVALUATION   Patient Name: [name] MRN: [id number] DOB:Feb 11, 1971, 53 y.o., female Today's Date: 10/18/2023  PHYSICAL THERAPY DISCHARGE SUMMARY  Visits from Start of Care: 4  Current functional level related to goals / functional outcomes: See most recent assessment.   Remaining deficits: See most recent assessment.   Education / Equipment: See most recent assessment.   Patient agrees to discharge. Patient goals were  unknown, pt had a change in status from 10/21/2023 ED visit . Patient is being discharged due to a change in medical status.  Albesa Huguenin, PT, DPT 10/25/2023, 10:33 AM    END OF SESSION:  PT End of Session - 10/18/23 1613     Visit Number 4    Number of Visits 13    Date for PT Re-Evaluation 11/09/23    PT Start Time 1530    PT Stop Time 1610    PT Time Calculation (min) 40 min                Past Medical History:  Diagnosis Date   Anemia    Diabetes mellitus    Type 2   Fibroid    Hypertension    Infection    UTI   Vaginal delivery [phone]   Vaginal Pap smear, abnormal    Past Surgical History:  Procedure Laterality Date   ABDOMINAL HYSTERECTOMY     KNEE SURGERY     x 3   ROBOTIC ASSISTED TOTAL HYSTERECTOMY WITH SALPINGECTOMY Bilateral 12/22/2015   Procedure: ROBOTIC ASSISTED TOTAL HYSTERECTOMY WITH BILATERAL SALPINGECTOMY;  Surgeon: Lenord Radon, MD;  Location: [facility];  Service: Gynecology;  Laterality: Bilateral;   Patient Active Problem List   Diagnosis Date Noted   Fibroids 12/22/2015   Post-operative state 12/22/2015   DUB (dysfunctional uterine bleeding) 10/12/2015   Obesity 03/01/2012   Diabetes mellitus (HCC) 10/26/2011    PCP: [facility]  REFERRING PROVIDER: Adah Acron, MD  REFERRING DIAG: M25.561,M25.562,G89.29 (ICD-10-CM) - Bilateral chronic knee pain   Rationale for Evaluation and Treatment:  Rehabilitation  THERAPY DIAG:  Muscle weakness (generalized)  Other abnormalities of gait and mobility  PERTINENT HISTORY: Dm2,  knee surgery 33 years ago, a Physiological scientist procedure with L tibial tubercle transfer  WEIGHT BEARING RESTRICTIONS: No  FALLS:  Has patient fallen in last 6 months? No  LIVING ENVIRONMENT: Lives with: lives with their family Lives in: House/apartment Stairs: No Has following equipment at home: Single point cane, Quad cane small base, and Walker - 2 wheeled  OCCUPATION: not currently working   PRECAUTIONS: None ---------------------------------------------------------------------------------------------  SUBJECTIVE:   SUBJECTIVE STATEMENT: Pt attended today's session with reports of minimal  knee pain. Pt stated that they have maintained good compliance with current HEP.  Feels pretty good today, noticed more mobility with exercises since last session.   Eval statement 09/28/2023: pt describes that she isnt necessarily having pain, its more of a feeling of weakness and general feeling that the knees will give out, like she doesn't trust them. Gets that feeling with prolonged walking. 0/10 pain currently, onset past 33 years, N/T   RED FLAGS: None   PLOF: Independent  PATIENT GOALS: get better at walking.  NEXT MD VISIT: 7 weeks from 09/21/2023 ---------------------------------------------------------------------------------------------  OBJECTIVE:  Note: Objective measures were completed at Evaluation unless otherwise noted.  DIAGNOSTIC FINDINGS: Impression: Patellofemoral degenerative changes.  Patella seems centrally  located on AP image.   PATIENT SURVEYS:  LEFS 40/80  COGNITION: Overall cognitive status: Within functional limits for tasks assessed     SENSATION: WFL   MUSCLE LENGTH: Hamstrings: Right 0 deg; Left -50 deg   POSTURE: rounded shoulders  PALPATION: No TTP  LOWER EXTREMITY ROM:  Passive ROM Right eval Left eval   Hip flexion [facility]  Hip extension [facility]  Hip abduction [facility]  Hip adduction    Hip internal rotation [facility]  Hip external rotation [facility]  Knee flexion [facility] WFL  Knee extension WFL  Fearful of L knee ext  Ankle dorsiflexion    Ankle plantarflexion    Ankle inversion    Ankle eversion     (Blank rows = not tested)  ! Indicates pain with testing  LOWER EXTREMITY MMT:  MMT Right eval Left eval  Hip flexion 4- 4-  Hip extension 4 4  Hip abduction 4- 4-  Hip adduction    Hip internal rotation    Hip external rotation    Knee flexion 5 4+  Knee extension 5 3+  Ankle dorsiflexion    Ankle plantarflexion    Ankle inversion    Ankle eversion     (Blank rows = not tested)  ! Indicates pain with testing  FUNCTIONAL TESTS:  2 minute walk test: 244ft X5 SLR: (R, No extensor lag) (L, 50d extensor lag)   GAIT: Distance walked: 227ft Assistive device utilized: None Level of assistance: SBA Comments: decreased L quad activatoin, B trunk lean during stance, indicating functional glute med weakness.  [facility] Adult PT Treatment:                                                DATE: 10/18/2023 Neuromuscular re-ed: Machine knee extension 3x1' mid range LLE Standing TKE  3x8, 5s hold  SLR X12, 5s hold LAQ, ecc control 2x8, 8s hold Therapeutic Activity: Nustep 5' lvl 5 for activity tolerance Pt education Anatomy/physiology of knee Motor recruitment for knee stability  [facility] Adult PT Treatment:                                                DATE: 10/11/2023  Neuromuscular re-ed: [place] ESTIM intramuscular w/dry needles Quad set 10' Setup 5' Therapeutic Activity: Nustep 5' lvl 5 for activity tolerance Backwards treadmill walk 5', quad activation, activity tolerance Lat Step up/down 2" step  2x8,  control eccentric step down, toe touch on RLE to assist PRN Step up and over 2'' box on LLE 2x5  Trigger Point Dry Needling  Initial Treatment: Pt instructed on Dry  Needling rational, procedures, and possible side effects. Pt instructed to expect mild to moderate muscle soreness later in the day and/or into the next day.  Pt instructed in methods to reduce muscle soreness. Pt instructed to continue prescribed HEP. Patient was educated on signs and symptoms of infection and other risk factors and advised to seek medical attention should they occur.  Patient verbalized understanding of these instructions and education.   Patient Verbal Consent Given: Yes Education Handout Provided: Yes Muscles Treated: Quadriceps muscle group Electrical Stimulation Performed: Yes, Parameters: frequency at 100, reduced to 20 for pt comfort, intensity to tolerance. Treatment Response/Outcome: muscle twitch response, quad activation with ESTIM  PATIENT EDUCATION:  Education details: Pt received education regarding HEP performance, ADL performance, functional activity tolerance, impairment education, appropriate performance of therapeutic activities. Person educated: Patient Education method: Medical illustrator Education comprehension: verbalized understanding and returned demonstration  HOME EXERCISE PROGRAM: Access Code: [email or web address] URL: [email or web address] Date: 09/28/2023 Prepared by: Albesa Huguenin  Exercises - Supine Hamstring Stretch with Strap  - 1 x daily - 7 x weekly - 2 sets - 1 reps - 109m hold - Active Straight Leg Raise with Quad Set  - 1 x daily - 7 x weekly - 2-3 sets - 8 reps - 4s hold - Supine Bridge with Resistance Band  - 1 x daily - 7 x weekly - 3 sets - 8 reps - 5s hold - Standing March with Counter Support  - 1 x daily - 7 x weekly - 2-3 sets - 10 reps - 3s hold - Standing Hip Abduction with Unilateral Counter Support  - 1 x daily - 7 x weekly - 2-3 sets - 10 reps - 3s hold ---------------------------------------------------------------------------------------------  ASSESSMENT:  CLINICAL IMPRESSION: Pt attended  physical therapy session for continuation of treatment regarding B knee pain. Today's treatment focused on improvement of  L quadriceps recruitment and activity/standing tolerance. Pt showed  great tolerance to administered treatment with no adverse effects by the end of session. Pt required moderate verbal/tactile cuing alongside minimal physical assistance for safe and appropriate performance of today's activities. Continue with therapeutic focus on L quad recruitment, B functional strength, and ambulation quality.   Eval impression (09/28/2023): Pt. attended today's physical therapy session for evaluation of B knee pain. Pt has complaints of 0/10 pain with a general feeling of weakness onset 33 years. Pt has notable deficits with L knee extensor lag, Confidence during ambulation, global BLE hip/knee strength, and gait speed/quality.  Pt would benefit from therapeutic focus on B hip/knee strengthening, progressive activity for confidence in L knee during ambulation and functional activity, L quadriceps motor recruitment, and gait training .  Treatment performed today focused on pt education detailed in obj. Pt demonstrated great understanding of education provided. required minimal verbal/tactile cues and stand by assistance for appropriate performance with today's activities. Pt requires the intervention of skilled outpatient physical therapy to address the aforementioned deficits and progress towards a functional level in line with therapeutic goals.   OBJECTIVE IMPAIRMENTS: Abnormal gait, decreased balance, decreased endurance, difficulty walking, decreased strength, and improper body mechanics.   ACTIVITY LIMITATIONS: lifting, bending, standing, squatting, stairs, and locomotion level  PARTICIPATION LIMITATIONS: interpersonal relationship, community activity, and occupation  PERSONAL FACTORS: Behavior pattern, Fitness, Past/current experiences, and Time since onset of injury/illness/exacerbation are  also affecting patient's functional outcome.   REHAB POTENTIAL: Good  CLINICAL DECISION MAKING: Stable/uncomplicated  EVALUATION COMPLEXITY: Low   GOALS: Goals reviewed with patient? YES  SHORT TERM GOALS: Target date: 10/19/2023  Pt will be independent with administered HEP to demonstrate the competency necessary for long term managemnet of symptoms at home.  Baseline: Goal status: INITIAL    LONG TERM GOALS: Target date: 11/09/2023  Pt. Will achieve a LEFS score of at least 50 as to demonstrate improvement in self-perceived functional ability with daily activities.  Baseline: 40 Goal status: INITIAL  2.  Pt will improve Global Hip/knee strength to a 4+/5 to demonstrate improvement in strength for quality of motion and activity performance.  Baseline: see obj chart Goal status: INITIAL  3.  Pt will improve by ambulating 337ft with LRAD and independence to demonstrate improved  gait quality, speed, and confidence necessary for community ambulation Baseline: 220ft, SBA Goal status: INITIAL  4.  Pt will improve x5 SLR on L side to have no extensor lag to demonstrate improved motor recruitment and functional strength of L quadriceps. Baseline: 50d Goal status: INITIAL   --------------------------------------------------------------------------------------------- PLAN:  PT FREQUENCY: 1-2x/week  PT DURATION: 6 weeks  PLANNED INTERVENTIONS: 97110-Therapeutic exercises, 97530- Therapeutic activity, 97112- Neuromuscular re-education, 97535- Self Care, 16109- Manual therapy, [phone]- Gait training, Patient/Family education, Balance training, Stair training, Dry Needling, and Joint mobilization  PLAN FOR NEXT SESSION: Continue with therapeutic focus on L quad recruitment, B functional strength, and ambulation quality.   Albesa Huguenin, PT, DPT 10/18/2023, 4:14 PM  For all possible CPT codes, reference the Planned Interventions line above.     Check all conditions that  are expected to impact treatment: {Conditions expected to impact treatment:None of these apply   If treatment provided at initial evaluation, no treatment charged due to lack of authorization.

## 2023-10-20 ENCOUNTER — Emergency Department (HOSPITAL_COMMUNITY)
Admission: EM | Admit: 2023-10-20 | Discharge: 2023-10-21 | Discharge status: Against Medical Advice | Attending: Emergency Medicine | Admitting: Emergency Medicine

## 2023-10-20 ENCOUNTER — Emergency Department (HOSPITAL_COMMUNITY)

## 2023-10-20 ENCOUNTER — Other Ambulatory Visit: Payer: Self-pay

## 2023-10-20 DIAGNOSIS — R0989 Other specified symptoms and signs involving the circulatory and respiratory systems: Secondary | ICD-10-CM | POA: Diagnosis not present

## 2023-10-20 DIAGNOSIS — R002 Palpitations: Secondary | ICD-10-CM | POA: Diagnosis present

## 2023-10-20 DIAGNOSIS — E119 Type 2 diabetes mellitus without complications: Secondary | ICD-10-CM | POA: Diagnosis not present

## 2023-10-20 DIAGNOSIS — I10 Essential (primary) hypertension: Secondary | ICD-10-CM | POA: Diagnosis not present

## 2023-10-20 DIAGNOSIS — Z5321 Procedure and treatment not carried out due to patient leaving prior to being seen by health care provider: Secondary | ICD-10-CM | POA: Insufficient documentation

## 2023-10-20 DIAGNOSIS — R059 Cough, unspecified: Secondary | ICD-10-CM | POA: Diagnosis not present

## 2023-10-20 DIAGNOSIS — R42 Dizziness and giddiness: Secondary | ICD-10-CM | POA: Insufficient documentation

## 2023-10-20 DIAGNOSIS — R519 Headache, unspecified: Secondary | ICD-10-CM | POA: Diagnosis not present

## 2023-10-20 DIAGNOSIS — Z7984 Long term (current) use of oral hypoglycemic drugs: Secondary | ICD-10-CM | POA: Diagnosis not present

## 2023-10-20 DIAGNOSIS — Z7985 Long-term (current) use of injectable non-insulin antidiabetic drugs: Secondary | ICD-10-CM | POA: Diagnosis not present

## 2023-10-20 DIAGNOSIS — R0981 Nasal congestion: Secondary | ICD-10-CM | POA: Diagnosis not present

## 2023-10-20 DIAGNOSIS — R300 Dysuria: Secondary | ICD-10-CM | POA: Diagnosis not present

## 2023-10-20 DIAGNOSIS — R0602 Shortness of breath: Secondary | ICD-10-CM | POA: Diagnosis not present

## 2023-10-20 DIAGNOSIS — R0789 Other chest pain: Secondary | ICD-10-CM | POA: Diagnosis present

## 2023-10-20 DIAGNOSIS — Z79899 Other long term (current) drug therapy: Secondary | ICD-10-CM | POA: Diagnosis not present

## 2023-10-20 LAB — CBC
HCT: 42.9 % (ref 36.0–46.0)
Hemoglobin: 13.8 g/dL (ref 12.0–15.0)
MCH: 28.9 pg (ref 26.0–34.0)
MCHC: 32.2 g/dL (ref 30.0–36.0)
MCV: 89.9 fL (ref 80.0–100.0)
Platelets: 235 10*3/uL (ref 150–400)
RBC: 4.77 MIL/uL (ref 3.87–5.11)
RDW: 14 % (ref 11.5–15.5)
WBC: 9.2 10*3/uL (ref 4.0–10.5)
nRBC: 0 % (ref 0.0–0.2)

## 2023-10-20 NOTE — ED Triage Notes (Signed)
 Pt reports concern for palpations that have been intermittent since yesterday occurring even at rest. Denies hx of palpation and cardiac problems. When palpations occur she becomes dizzy and has a discomfort down the left arm that radiates into the back. Reports increased stress d/t loss of job and husband. Denies energy drinks/caffeine use.

## 2023-10-21 ENCOUNTER — Other Ambulatory Visit: Payer: Self-pay

## 2023-10-21 ENCOUNTER — Emergency Department (HOSPITAL_COMMUNITY)
Admission: EM | Admit: 2023-10-21 | Discharge: 2023-10-21 | Disposition: A | Discharge status: Home / Self Care | Source: Home / Self Care | Attending: Emergency Medicine | Admitting: Emergency Medicine

## 2023-10-21 ENCOUNTER — Encounter (HOSPITAL_COMMUNITY): Payer: Self-pay

## 2023-10-21 DIAGNOSIS — R0989 Other specified symptoms and signs involving the circulatory and respiratory systems: Secondary | ICD-10-CM | POA: Insufficient documentation

## 2023-10-21 DIAGNOSIS — Z7984 Long term (current) use of oral hypoglycemic drugs: Secondary | ICD-10-CM | POA: Insufficient documentation

## 2023-10-21 DIAGNOSIS — Z79899 Other long term (current) drug therapy: Secondary | ICD-10-CM | POA: Insufficient documentation

## 2023-10-21 DIAGNOSIS — R0602 Shortness of breath: Secondary | ICD-10-CM | POA: Insufficient documentation

## 2023-10-21 DIAGNOSIS — R0789 Other chest pain: Secondary | ICD-10-CM | POA: Insufficient documentation

## 2023-10-21 DIAGNOSIS — R079 Chest pain, unspecified: Secondary | ICD-10-CM

## 2023-10-21 DIAGNOSIS — E119 Type 2 diabetes mellitus without complications: Secondary | ICD-10-CM | POA: Insufficient documentation

## 2023-10-21 DIAGNOSIS — I10 Essential (primary) hypertension: Secondary | ICD-10-CM | POA: Insufficient documentation

## 2023-10-21 DIAGNOSIS — Z794 Long term (current) use of insulin: Secondary | ICD-10-CM | POA: Insufficient documentation

## 2023-10-21 DIAGNOSIS — R0981 Nasal congestion: Secondary | ICD-10-CM | POA: Insufficient documentation

## 2023-10-21 DIAGNOSIS — R519 Headache, unspecified: Secondary | ICD-10-CM | POA: Insufficient documentation

## 2023-10-21 DIAGNOSIS — R300 Dysuria: Secondary | ICD-10-CM | POA: Insufficient documentation

## 2023-10-21 DIAGNOSIS — R059 Cough, unspecified: Secondary | ICD-10-CM | POA: Insufficient documentation

## 2023-10-21 LAB — URINALYSIS, ROUTINE W REFLEX MICROSCOPIC
Bilirubin Urine: NEGATIVE
Glucose, UA: NEGATIVE mg/dL
Hgb urine dipstick: NEGATIVE
Ketones, ur: 5 mg/dL — AB
Leukocytes,Ua: NEGATIVE
Nitrite: NEGATIVE
Protein, ur: NEGATIVE mg/dL
Specific Gravity, Urine: 1.01 (ref 1.005–1.030)
pH: 7 (ref 5.0–8.0)

## 2023-10-21 LAB — TROPONIN I (HIGH SENSITIVITY)
Troponin I (High Sensitivity): 3 ng/L (ref <18)
Troponin I (High Sensitivity): 28 ng/L — ABNORMAL HIGH (ref <18)
Troponin I (High Sensitivity): 3 ng/L (ref <18)

## 2023-10-21 LAB — BASIC METABOLIC PANEL WITH GFR
Anion gap: 12 (ref 5–15)
BUN: 11 mg/dL (ref 6–20)
CO2: 24 mmol/L (ref 22–32)
Calcium: 9.1 mg/dL (ref 8.9–10.3)
Chloride: 103 mmol/L (ref 98–111)
Creatinine, Ser: 0.71 mg/dL (ref 0.44–1.00)
GFR, Estimated: >60 mL/min (ref >60)
Glucose, Bld: 102 mg/dL — ABNORMAL HIGH (ref 70–99)
Potassium: 3.5 mmol/L (ref 3.5–5.1)
Sodium: 139 mmol/L (ref 135–145)

## 2023-10-21 LAB — RESP PANEL BY RT-PCR (RSV, FLU A&B, COVID)  RVPGX2
Influenza A by PCR: NEGATIVE
Influenza B by PCR: NEGATIVE
Resp Syncytial Virus by PCR: NEGATIVE
SARS Coronavirus 2 by RT PCR: NEGATIVE

## 2023-10-21 LAB — D-DIMER, QUANTITATIVE: D-Dimer, Quant: <0.27 ug{FEU}/mL (ref 0.00–0.50)

## 2023-10-21 LAB — BRAIN NATRIURETIC PEPTIDE: B Natriuretic Peptide: 23.2 pg/mL (ref 0.0–100.0)

## 2023-10-21 MED ORDER — ALUM & MAG HYDROXIDE-SIMETH 200-200-20 MG/5ML PO SUSP
30.0000 mL | Freq: Once | ORAL | Status: AC
  Administered 2023-10-21: 30 mL via ORAL
  Filled 2023-10-21: qty 30

## 2023-10-21 MED ORDER — FAMOTIDINE 20 MG PO TABS: 20.0000 mg | ORAL_TABLET | Freq: Two times a day (BID) | ORAL | 0 refills | Status: AC

## 2023-10-21 MED ORDER — CEPHALEXIN 500 MG PO CAPS
500.0000 mg | ORAL_CAPSULE | Freq: Once | ORAL | Status: AC
  Administered 2023-10-21: 500 mg via ORAL
  Filled 2023-10-21: qty 1

## 2023-10-21 MED ORDER — CEPHALEXIN 500 MG PO CAPS
500.0000 mg | ORAL_CAPSULE | Freq: Two times a day (BID) | ORAL | 0 refills | Status: AC
Start: 2023-10-21 — End: 2023-10-28

## 2023-10-21 NOTE — Discharge Instructions (Signed)
 You are being treated for possible urinary infection based on your symptoms.  Your urinalysis and urine culture showed result over the next few days.  You were given an antibiotic today and prescribed more at home.  Your blood work overall was reassuring.  Even a single abnormal blood test for and a heart enzyme level called troponin.  This was felt to likely be a lab error, as your repeat tests were normal.  I think your chest pain is most likely caused by inflammation or irritation of your stomach or esophagus from the apple cider vinegar.  I recommend you stop taking this vinegar.  I recommend that you start taking Pepcid twice a day.  As a precaution, we did refer you to see cardiology for an evaluation of your heart.  This is based on your age and your risk factors for heart disease, including diabetes, high cholesterol and high blood pressure.  If you do not hear from their scheduling office within 3 business days, please call the number above for heart care and request a follow-up appointment.

## 2023-10-21 NOTE — ED Triage Notes (Addendum)
 Pt BIBA with c/o SHOB that's worse when lying down and palpitations x2 days w/ intermittent nausea no vomiting. Pt also endorses congestion, chills, and post nasal drip.Denies fevers Recently here last night and LWS.  110-16-160/100-100%RA  CBG 169

## 2023-10-21 NOTE — ED Notes (Signed)
 Pt sts she's is leaving at this time

## 2023-10-21 NOTE — ED Provider Notes (Signed)
 [place] EMERGENCY DEPARTMENT AT [facility] Provider Note   CSN: [id number] Arrival date & time: 10/21/23  7829     History  Chief Complaint  Patient presents with   Shortness of Breath    [name] is a 53 y.o. female presented to the ED with chest heaviness and shortness of breath ongoing for 2 days.  Patient reports that she has noted specifically at night when she is lying down a sensation like a "balloon expanding in the middle of my chest" when she is in bed, making it difficult to breathe.  She has noted some bilateral leg swelling as well.  She denies history of coronary disease, cardiac disease, heart failure, DVT or PE.  She says during the daytime she is able to perform normal activities has not noticed any significant change in her exertional level or any dyspnea.  Denies chest pain.  Reports she has had some head congestion, runny nose, headache, mild cough, wonders about a viral syndrome as well.   Patient came to the emergency department late last night and was triaged to left without being seen due to prolonged waiting times.  I reviewed those labs and her troponin level was 3, BMP was unremarkable and normal levels, CBC also normal.  Patient report significant stress in her life with the recent passing of her husband in January, and a loss of her job in December.  She is here with her son at the bedside  She reports that she was told in the past that she has a "faster than normal heart rate at rest".  She also with a history of diabetes for which she is on oral medications and Mounjaro.  She has a history of hypertension.  She did take her morning medications.  HPI     Home Medications Prior to Admission medications   Medication Sig Start Date End Date Taking? Authorizing Provider  cephALEXin  (KEFLEX ) 500 MG capsule Take 1 capsule (500 mg total) by mouth 2 (two) times daily for 7 days. 10/21/23 10/28/23 Yes Nikkol Pai, Janalyn Me, MD  famotidine (PEPCID)  20 MG tablet Take 1 tablet (20 mg total) by mouth 2 (two) times daily. 10/21/23 11/20/23 Yes Armstead Heiland, Janalyn Me, MD  amLODipine (NORVASC) 5 MG tablet Take 5 mg by mouth daily.    [provider]  liraglutide (VICTOZA) 18 MG/3ML SOPN Inject 1.2 mg into the skin. Patient not taking: Reported on 06/05/2023    [provider]  losartan  (COZAAR ) 50 MG tablet Take 50 mg by mouth daily. Patient not taking: Reported on 06/05/2023    [provider]  metFORMIN  (GLUCOPHAGE ) 1000 MG tablet metformin  1,000 mg tablet  TAKE 1 TABLET TWICE A DAY BY ORAL ROUTE FOR 90 DAYS.    [provider]  nateglinide (STARLIX) 120 MG tablet Take 120 mg by mouth 3 (three) times daily. 08/10/21   [provider]  rosuvastatin (CRESTOR) 5 MG tablet Take 5 mg by mouth daily.    [provider]  tirzepatide Florence Hunt) 10 MG/0.5ML Pen Inject 10 mg into the skin once a week.    [provider]  carbamazepine  (TEGRETOL ) 200 MG tablet 200 mg day 1, 200 mg twice daily for day 2 and 3, 400 mg twice daily for day 3 and 4. Use 400 mg twice daily, as needed, on days 5 through 7. 02/28/16 02/11/19  Joy, Shawn C, PA-C  glimepiride  (AMARYL ) 4 MG tablet Take 1 tablet (4 mg total) by mouth daily  with breakfast. 12/23/15 02/11/19  Lenord Radon, MD  lisinopril  (PRINIVIL ,ZESTRIL ) 5 MG tablet Take 5 mg by mouth daily.  02/11/19  [provider]  sitaGLIPtin -metformin  (JANUMET ) 50-1000 MG per tablet Take 1 tablet by mouth 2 (two) times daily with a meal.  02/11/19  [provider]      Allergies    Bactrim [sulfamethoxazole -trimethoprim] and Percocet [oxycodone-acetaminophen]    Review of Systems   Review of Systems  Physical Exam Updated Vital Signs BP (!) 150/106   Pulse (!) 109   Temp 99.3 F (37.4 C)   Resp 11   Ht 5\' 2"  (1.575 m)   Wt 103 kg   LMP 10/02/2015   SpO2 100%   BMI 41.52 kg/m  Physical Exam Constitutional:      General: She is not in  acute distress. HENT:     Head: Normocephalic and atraumatic.  Eyes:     Conjunctiva/sclera: Conjunctivae normal.     Pupils: Pupils are equal, round, and reactive to light.  Cardiovascular:     Rate and Rhythm: Regular rhythm. Tachycardia present.  Pulmonary:     Effort: Pulmonary effort is normal. No respiratory distress.     Comments: Difficult auscultate breath sounds posteriorly, anterior breath sounds with no audible wheezing or crackles Abdominal:     General: There is no distension.     Tenderness: There is no abdominal tenderness.  Musculoskeletal:     Right lower leg: Edema present.     Left lower leg: Edema present.  Skin:    General: Skin is warm and dry.  Neurological:     General: No focal deficit present.     Mental Status: She is alert. Mental status is at baseline.  Psychiatric:        Mood and Affect: Mood normal.        Behavior: Behavior normal.     ED Results / Procedures / Treatments   Labs (all labs ordered are listed, but only abnormal results are displayed) Labs Reviewed  URINALYSIS, ROUTINE W REFLEX MICROSCOPIC - Abnormal; Notable for the following components:      Result Value   Color, Urine STRAW (*)    Ketones, ur 5 (*)    All other components within normal limits  RESP PANEL BY RT-PCR (RSV, FLU A&B, COVID)  RVPGX2  D-DIMER, QUANTITATIVE  BRAIN NATRIURETIC PEPTIDE  TROPONIN I (HIGH SENSITIVITY)    EKG None  Radiology DG Chest Port 1 View Result Date: 10/20/2023 CLINICAL DATA:  [id number] Pain [id number] EXAM: PORTABLE CHEST 1 VIEW COMPARISON:  Chest x-ray 02/24/2012 FINDINGS: The heart and mediastinal contours are within normal limits. No focal consolidation. No pulmonary edema. No pleural effusion. No pneumothorax. No acute osseous abnormality. IMPRESSION: No active disease. Electronically Signed   By: Morgane  Naveau M.D.   On: 10/20/2023 23:28    Procedures Procedures    Medications Ordered in ED Medications  alum & mag hydroxide-simeth  (MAALOX/MYLANTA) 200-200-20 MG/5ML suspension 30 mL (30 mLs Oral Given 10/21/23 1247)  alum & mag hydroxide-simeth (MAALOX/MYLANTA) 200-200-20 MG/5ML suspension 30 mL (30 mLs Oral Given 10/21/23 1247)  cephALEXin  (KEFLEX ) capsule 500 mg (500 mg Oral Given 10/21/23 1247)    ED Course/ Medical Decision Making/ A&P Clinical Course as of 10/21/23 1615  Sat Oct 21, 2023  1149 Troponin I (High Sensitivity): 3 [MT]  1149 Trop reported from this morning was 28 but repeat trop is 3 which is rather atypical, I wonder about a lab error.  Trop last  night was 3.  We are trying to clarify [MT]  1225 Patient reassessed and remains minimally symptomatic.  Urinalysis was not originally sent but she reports she is continuing to have dysuria and burning with urination, so we will send a UA but treat her empirically for possible UTI.  She now clarifies to me that she has been taking apple cider vinegar for the past week, and I strongly suspect that this may be triggering esophagitis or reflux gastritis.  This to be more consistent with her pain that worsens with lying flat.  We will start her on Pepcid and I think she will be stable for discharge.  She does have risk factors for cardiovascular disease and never had a cardiac workup, so I think a referral to cardiology as an outpatient be reasonable, but she is stable for discharge and comfortable going home [MT]    Clinical Course User Index [MT] Kathalene Sporer, Janalyn Me, MD                                 Medical Decision Making Amount and/or Complexity of Data Reviewed Labs: ordered. Decision-making details documented in ED Course. ECG/medicine tests: ordered.  Risk OTC drugs. Prescription drug management.   This patient presents to the ED with concern for shortness of breath, dysuria, chest pressure. This involves an extensive number of treatment options, and is a complaint that carries with it a high risk of complications and morbidity.  The differential diagnosis  includes reflux esophagitis or gastritis versus atypical ACS versus congestive heart failure versus PE versus other  The fact that the patient symptoms began after drinking apple cider vinegar for the past 7 days, and are specifically worse with lying down at night, are strongly suggestive of reflux or gastritis.  We can start her on Pepcid.  Dysuria and urinary frequency may be related to symptomatic hyperglycemia versus UTI versus other  Co-morbidities that complicate the patient evaluation: History of diabetes, high cholesterol, high blood pressure, cardiovascular risk factors   I ordered and personally interpreted labs.  The pertinent results include: Troponin 3 (last night trop 3).  BNP normal.  Ddimer < 0.27, covid flu negative.  I reviewed her labs from last night with no emergent findings of dehydration or anemia on BMP, CBC.  There was a single isolated morning trop of 28 which I suspect was lab error as proceeding trop and following trop were both 3.  Patient also had an x-ray of her chest performed yesterday from triage which I reviewed myself with no active disease, no evidence of pneumonia or pneumothorax  The patient was maintained on a cardiac monitor.  I personally viewed and interpreted the cardiac monitored which showed an underlying rhythm of: Sinus rhythm and sinus tachycardia  Per my interpretation the patient's ECG shows sinus rhythm with no acute ischemic findings  I ordered medication including GI cocktail for gastritis potential symptoms or chest pressure, Keflex  for dysuria and possible UTI  I have reviewed the patients home medicines and have made adjustments as needed  Test Considered: With a negative D-dimer and no other acute risk factors, have a low suspicion for acute PE did not feel that she needed a CT angiogram  After the interventions noted above, I reevaluated the patient and found that they have: improved   Disposition:  After consideration of the  diagnostic results and the patients response to treatment, I feel that the patent  would benefit from close outpatient follow-up.         Final Clinical Impression(s) / ED Diagnoses Final diagnoses:  Chest pain, unspecified type  Dysuria    Rx / DC Orders ED Discharge Orders          Ordered    famotidine (PEPCID) 20 MG tablet  2 times daily        10/21/23 1227    cephALEXin  (KEFLEX ) 500 MG capsule  2 times daily        10/21/23 1227    Ambulatory referral to Cardiology        10/21/23 1227              Arvilla Birmingham, MD 10/21/23 [phone]

## 2023-10-23 ENCOUNTER — Encounter: Payer: Self-pay | Admitting: *Deleted

## 2023-10-23 ENCOUNTER — Ambulatory Visit
Admission: EM | Admit: 2023-10-23 | Discharge: 2023-10-23 | Disposition: A | Discharge status: Home / Self Care | Attending: Family Medicine | Admitting: Family Medicine

## 2023-10-23 DIAGNOSIS — R002 Palpitations: Secondary | ICD-10-CM

## 2023-10-23 DIAGNOSIS — K219 Gastro-esophageal reflux disease without esophagitis: Secondary | ICD-10-CM | POA: Diagnosis not present

## 2023-10-23 MED ORDER — PANTOPRAZOLE SODIUM 20 MG PO TBEC: 20.0000 mg | DELAYED_RELEASE_TABLET | Freq: Two times a day (BID) | ORAL | 0 refills | Status: DC

## 2023-10-23 NOTE — ED Triage Notes (Signed)
 Pt seen in the ED this weekend due to palpitations- states she is to be referred to Cardiology. States she also was given antibiotics for possible UTI. States pharmacist advised her to take the pepcid after she completes the antibiotics so she is only taking TUMS. She is concerned mostly about the palpitations which are worse when she is at rest and the heartburn.

## 2023-10-23 NOTE — Discharge Instructions (Addendum)
 I have prescribed you Protonix  you may take twice daily with meals for the next 10 days to help with your reflux symptoms.  Wait 24 hours to see if your symptoms return if they do not return only take famotidine if needed for acid symptoms such as burning in the esophagus, heartburn or belching. Suspect reflux symptom maybe medications prescribed for treatment of diabetes.  The 2 diabetes is greatly improved and there is no indication to discontinue these medications just to manage effects with medications to help treat acid reflux and indigestion. I have resent your referral to cardiology as I was unable to see that it had completely gone through in the system. ER if symptoms of palpations or chest tightness or pain develop

## 2023-10-23 NOTE — ED Provider Notes (Signed)
 [name]    CSN: [id number] Arrival date & time: 10/23/23  1101      History   Chief Complaint Chief Complaint  Patient presents with   Follow-up    HPI [name] is a 53 y.o. female.   HPI With a history of type 2 diabetes presents today for evaluation of ongoing palpitations , itching and burning in the chest.  Of note patient was seen on 10/21/2023 the emergency department and had a full workup to rule out cardiac cause of symptoms. EDP worked patient up suggested that this is likely related to acid reflux however given patient's history of diabetes hypertension recommended evaluation by cardiology on an outpatient basis.  Patient here for follow-up as she is not scheduled to see her primary care doctor until Nov 23, 2023.  This visit patient was treated for a UTI with Keflex  due to dysuria and was told by the pharmacist not to take famotidine with the Keflex  therefore she has been taken times however GI symptoms have worsened since being discharged from the hospital.  She denies any chest pain but endorses chest burning and symptoms are worse when eating certain foods and with laying down.  Past Medical History:  Diagnosis Date   Anemia    Diabetes mellitus    Type 2   Fibroid    Hypertension    Infection    UTI   Vaginal delivery [phone]   Vaginal Pap smear, abnormal     Patient Active Problem List   Diagnosis Date Noted   Fibroids 12/22/2015   Post-operative state 12/22/2015   DUB (dysfunctional uterine bleeding) 10/12/2015   Obesity 03/01/2012   Diabetes mellitus (HCC) 10/26/2011    Past Surgical History:  Procedure Laterality Date   ABDOMINAL HYSTERECTOMY     KNEE SURGERY     x 3   ROBOTIC ASSISTED TOTAL HYSTERECTOMY WITH SALPINGECTOMY Bilateral 12/22/2015   Procedure: ROBOTIC ASSISTED TOTAL HYSTERECTOMY WITH BILATERAL SALPINGECTOMY;  Surgeon: Lenord Radon, MD;  Location: [facility];  Service: Gynecology;  Laterality:  Bilateral;    OB History     Gravida  3   Para  3   Term  3   Preterm      AB      Living  3      SAB      IAB      Ectopic      Multiple      Live Births               Home Medications    Prior to Admission medications   Medication Sig Start Date End Date Taking? Authorizing Provider  amLODipine (NORVASC) 5 MG tablet Take 5 mg by mouth daily.   Yes [provider]  cephALEXin  (KEFLEX ) 500 MG capsule Take 1 capsule (500 mg total) by mouth 2 (two) times daily for 7 days. 10/21/23 10/28/23 Yes Trifan, Janalyn Me, MD  metFORMIN  (GLUCOPHAGE ) 1000 MG tablet metformin  1,000 mg tablet  TAKE 1 TABLET TWICE A DAY BY ORAL ROUTE FOR 90 DAYS.   Yes [provider]  nateglinide (STARLIX) 120 MG tablet Take 120 mg by mouth 3 (three) times daily. 08/10/21  Yes [provider]  pantoprazole  (PROTONIX ) 20 MG tablet Take 1 tablet (20 mg total) by mouth 2 (two) times daily before a meal. 10/23/23  Yes Buena Carmine, NP  rosuvastatin (CRESTOR) 5 MG tablet Take 5 mg by mouth daily.   Yes [provider]  tirzepatide (MOUNJARO) 10 MG/0.5ML Pen Inject 10 mg into the skin once a week.   Yes [provider]  famotidine (PEPCID) 20 MG tablet Take 1 tablet (20 mg total) by mouth 2 (two) times daily. Patient not taking: Reported on 10/23/2023 10/21/23 11/20/23  Arvilla Birmingham, MD  liraglutide (VICTOZA) 18 MG/3ML SOPN Inject 1.2 mg into the skin. Patient not taking: Reported on 06/05/2023    [provider]  losartan  (COZAAR ) 50 MG tablet Take 50 mg by mouth daily. Patient not taking: Reported on 06/05/2023    [provider]  carbamazepine  (TEGRETOL ) 200 MG tablet 200 mg day 1, 200 mg twice daily for day 2 and 3, 400 mg twice daily for day 3 and 4. Use 400 mg twice daily, as needed, on days 5 through 7. 02/28/16 02/11/19  Joy, Shawn C, PA-C  glimepiride  (AMARYL ) 4 MG tablet Take 1 tablet (4 mg total) by mouth daily with breakfast.  12/23/15 02/11/19  Lenord Radon, MD  lisinopril  (PRINIVIL ,ZESTRIL ) 5 MG tablet Take 5 mg by mouth daily.  02/11/19  [provider]  sitaGLIPtin -metformin  (JANUMET ) 50-1000 MG per tablet Take 1 tablet by mouth 2 (two) times daily with a meal.  02/11/19  [provider]    Family History Family History  Problem Relation Age of Onset   Hypertension Mother    Diabetes Father    Hypertension Father    Hypertension Maternal Grandmother    Hypertension Maternal Grandfather    Hypertension Paternal Grandmother    Hypertension Paternal Grandfather    Breast cancer Maternal Aunt     Social History Social History   Tobacco Use   Smoking status: Never   Smokeless tobacco: Never  Vaping Use   Vaping status: Never Used  Substance Use Topics   Alcohol use: No   Drug use: No     Allergies   Bactrim [sulfamethoxazole -trimethoprim] and Percocet [oxycodone-acetaminophen]   Review of Systems Review of Systems Pertinent negatives listed in HPI  Physical Exam Triage Vital Signs ED Triage Vitals [10/23/23 1224]  Encounter Vitals Group     BP 131/89     Systolic BP Percentile      Diastolic BP Percentile      Pulse Rate (!) 104     Resp 14     Temp 98.1 F (36.7 C)     Temp Source Oral     SpO2 98 %     Weight      Height      Head Circumference      Peak Flow      Pain Score      Pain Loc      Pain Education      Exclude from Growth Chart    No data found.  Updated Vital Signs BP 131/89 (BP Location: Left Arm)   Pulse (!) 104   Temp 98.1 F (36.7 C) (Oral)   Resp 14   LMP 10/02/2015   SpO2 98%   Visual Acuity Right Eye Distance:   Left Eye Distance:   Bilateral Distance:    Right Eye Near:   Left Eye Near:    Bilateral Near:     Physical Exam Vitals reviewed.  Constitutional:      Appearance: Normal appearance.  HENT:     Head: Normocephalic and atraumatic.  Eyes:     Extraocular Movements: Extraocular movements intact.      Pupils: Pupils are equal, round, and reactive to light.  Cardiovascular:  Rate and Rhythm: Normal rate and regular rhythm.  Pulmonary:     Effort: Pulmonary effort is normal.     Breath sounds: Normal breath sounds.  Musculoskeletal:     Cervical back: Normal range of motion and neck supple.  Skin:    General: Skin is warm and dry.  Neurological:     General: No focal deficit present.     Mental Status: She is alert and oriented to person, place, and time.  Psychiatric:        Mood and Affect: Mood normal.      UC Treatments / Results  Labs (all labs ordered are listed, but only abnormal results are displayed) Labs Reviewed - No data to display  EKG   Radiology No results found.  Procedures Procedures (including critical care time)  Medications Ordered in UC Medications - No data to display  Initial Impression / Assessment and Plan / UC Course  I have reviewed the triage vital signs and the nursing notes.  Pertinent labs & imaging results that were available during my care of the patient were reviewed by me and considered in my medical decision making (see chart for details).    GERD, treated with Protonix  20 mg tablet twice daily with food x 10 days.  Advised to wait 24 hours if symptoms have resolved resume famotidine only as needed.  Able to see referral in epic sent a new referral to cardiology for patient to have full cardiac workup given her risk factors of type 2 diabetes, female, African-American, obesity hypertension patient warrants a full cardiac dilation given symptoms.  Today patient generally well-appearing and vital signs overall stable.  Given ED precautions if any of her symptoms worsen and advised to keep follow-up with primary care doctor scheduled for 11/23/2023. Final Clinical Impressions(s) / UC Diagnoses   Final diagnoses:  Gastroesophageal reflux disease without esophagitis  Palpitations     Discharge Instructions      I have prescribed  you Protonix  you may take twice daily with meals for the next 10 days to help with your reflux symptoms.  Wait 24 hours to see if your symptoms return if they do not return only take famotidine if needed for acid symptoms such as burning in the esophagus, heartburn or belching. Suspect reflux symptom maybe medications prescribed for treatment of diabetes.  The 2 diabetes is greatly improved and there is no indication to discontinue these medications just to manage effects with medications to help treat acid reflux and indigestion. I have resent your referral to cardiology as I was unable to see that it had completely gone through in the system. ER if symptoms of palpations or chest tightness or pain develop    ED Prescriptions     Medication Sig Dispense Auth. Provider   pantoprazole  (PROTONIX ) 20 MG tablet Take 1 tablet (20 mg total) by mouth 2 (two) times daily before a meal. 20 tablet Buena Carmine, NP      PDMP not reviewed this encounter.   Buena Carmine, NP 10/23/23 1308

## 2023-10-25 ENCOUNTER — Ambulatory Visit: Admitting: Physical Therapy

## 2023-10-26 ENCOUNTER — Encounter: Payer: Self-pay | Admitting: Physician Assistant

## 2023-11-01 ENCOUNTER — Encounter: Payer: Self-pay | Admitting: Family

## 2023-11-01 ENCOUNTER — Ambulatory Visit: Admitting: Physical Therapy

## 2023-11-01 ENCOUNTER — Ambulatory Visit (INDEPENDENT_AMBULATORY_CARE_PROVIDER_SITE_OTHER): Admitting: Family

## 2023-11-01 VITALS — BP 116/80 | HR 99 | Temp 98.0°F | Resp 16 | Ht 62.0 in | Wt 217.6 lb

## 2023-11-01 DIAGNOSIS — Z7984 Long term (current) use of oral hypoglycemic drugs: Secondary | ICD-10-CM | POA: Diagnosis not present

## 2023-11-01 DIAGNOSIS — I10 Essential (primary) hypertension: Secondary | ICD-10-CM | POA: Diagnosis not present

## 2023-11-01 DIAGNOSIS — Z7985 Long-term (current) use of injectable non-insulin antidiabetic drugs: Secondary | ICD-10-CM

## 2023-11-01 DIAGNOSIS — K219 Gastro-esophageal reflux disease without esophagitis: Secondary | ICD-10-CM

## 2023-11-01 DIAGNOSIS — F418 Other specified anxiety disorders: Secondary | ICD-10-CM

## 2023-11-01 DIAGNOSIS — E119 Type 2 diabetes mellitus without complications: Secondary | ICD-10-CM

## 2023-11-01 DIAGNOSIS — R002 Palpitations: Secondary | ICD-10-CM | POA: Diagnosis not present

## 2023-11-01 DIAGNOSIS — F419 Anxiety disorder, unspecified: Secondary | ICD-10-CM

## 2023-11-01 DIAGNOSIS — E1165 Type 2 diabetes mellitus with hyperglycemia: Secondary | ICD-10-CM

## 2023-11-01 DIAGNOSIS — Z7689 Persons encountering health services in other specified circumstances: Secondary | ICD-10-CM

## 2023-11-01 DIAGNOSIS — E785 Hyperlipidemia, unspecified: Secondary | ICD-10-CM

## 2023-11-01 LAB — POCT GLYCOSYLATED HEMOGLOBIN (HGB A1C): Hemoglobin A1C: 6.4 % — AB (ref 4.0–5.6)

## 2023-11-01 MED ORDER — HYDROXYZINE PAMOATE 25 MG PO CAPS: 25.0000 mg | ORAL_CAPSULE | Freq: Three times a day (TID) | ORAL | 2 refills | Status: DC | PRN

## 2023-11-01 MED ORDER — ESCITALOPRAM OXALATE 5 MG PO TABS: 5.0000 mg | ORAL_TABLET | Freq: Every day | ORAL | 0 refills | Status: DC

## 2023-11-01 MED ORDER — MOUNJARO 10 MG/0.5ML ~~LOC~~ SOAJ: 10.0000 mg | SUBCUTANEOUS | 0 refills | Status: DC

## 2023-11-01 NOTE — Progress Notes (Signed)
 Subjective:    [name] - 53 y.o. female MRN [id number]  Date of birth: 13-Mar-1971  HPI  [name] is to establish care and Emergency Department/Urgent Care follow-up.   Current issues and/or concerns: - Hypertension. Reports doing well on Amloidipine, no issues/concerns. No refills needed presently. She does not complain of red flag symptoms such as but not limited to chest pain, shortness of breath, worst headache of life, nausea/vomiting.  - Type 2 diabetes. Reports doing well on Metformin , Tirzepatide, and Nateglinide, no issues/concerns. States she only needs refills of Tirzepatide. Denies red flag symptoms associated with diabetes.  - States she is up to date on diabetic eye exam. - Hyperlipidemia. Reports doing well on Rosuvastatin, no issues/concerns. No refills needed presently. - Patient states since Emergency Department and Urgent Care discharge still having intermittent palpitations. Denies red flag symptoms. States acid reflux has improved. States she has upcoming appointments with Cardiology and Gastroenterology. - Anxiety depression. States primarily related to her husband recently passed away from colorectal cancer. States she was his caregiver. States she is not currently working but keeps herself busy during the day. States it is difficult for her to sleep during nighttime due to thinking of the memories her and her husband shared before he passed away. States she is considering joining a grief group at her church. She would like to try medication to help with anxiety depression and a referral to a therapist. She denies thoughts of self-harm, suicidal ideations, homicidal ideations. - No further issues/concerns for discussion today.   ROS per HPI     Health Maintenance:  Health Maintenance Due  Topic Date Due   FOOT EXAM  Never done   OPHTHALMOLOGY EXAM  Never done   HIV Screening  Never done   Diabetic kidney evaluation - Urine ACR  Never done    Hepatitis C Screening  Never done   HEMOGLOBIN A1C  06/07/2014   Colonoscopy  Never done     Past Medical History: Patient Active Problem List   Diagnosis Date Noted   Fibroids 12/22/2015   Post-operative state 12/22/2015   DUB (dysfunctional uterine bleeding) 10/12/2015   Obesity 03/01/2012   Diabetes mellitus (HCC) 10/26/2011   Social History   reports that she has never smoked. She has never used smokeless tobacco. She reports that she does not drink alcohol and does not use drugs.   Family History  family history includes Breast cancer in her maternal aunt; Diabetes in her father; Hypertension in her father, maternal grandfather, maternal grandmother, mother, paternal grandfather, and paternal grandmother.   Medications: reviewed and updated   Objective:   Physical Exam BP 116/80   Pulse 99   Temp 98 F (36.7 C) (Oral)   Resp 16   Ht 5\' 2"  (1.575 m)   Wt 217 lb 9.6 oz (98.7 kg)   LMP 10/02/2015   SpO2 94%   BMI 39.80 kg/m   Physical Exam HENT:     Head: Normocephalic and atraumatic.     Nose: Nose normal.     Mouth/Throat:     Mouth: Mucous membranes are moist.     Pharynx: Oropharynx is clear.  Eyes:     Extraocular Movements: Extraocular movements intact.     Conjunctiva/sclera: Conjunctivae normal.     Pupils: Pupils are equal, round, and reactive to light.  Cardiovascular:     Rate and Rhythm: Normal rate and regular rhythm.     Pulses: Normal pulses.     Heart  sounds: Normal heart sounds.  Pulmonary:     Effort: Pulmonary effort is normal.     Breath sounds: Normal breath sounds.  Musculoskeletal:        General: Normal range of motion.     Cervical back: Normal range of motion and neck supple.  Neurological:     General: No focal deficit present.     Mental Status: She is alert and oriented to person, place, and time.  Psychiatric:        Mood and Affect: Mood normal.        Behavior: Behavior normal.        Assessment & Plan:  1.  Encounter to establish care (Primary) - Patient presents today to establish care. During the interim follow-up with primary provider as scheduled.  - Return for annual physical examination, labs, and health maintenance. Arrive fasting meaning having no food for at least 8 hours prior to appointment. You may have only water  or black coffee. Please take scheduled medications as normal.  2. Primary hypertension 3. Palpitations - Continue Amlodipine as prescribed. No refills needed as of present.  - Counseled on blood pressure goal of less than 130/80, low-sodium, DASH diet, medication compliance, and 150 minutes of moderate intensity exercise per week as tolerated. Counseled on medication adherence and adverse effects. - Keep all scheduled appointments with Cardiology.  - Follow-up with primary provider as scheduled.  4. Type 2 diabetes mellitus with hyperglycemia, without long-term current use of insulin  (HCC) - Continue Metformin  and Nateglinide as prescribed. No refills needed as of present. - Continue Tirzepatide as prescribed.  - Hemoglobin A1c result pending.  - Routine screening.  - Discussed the importance of healthy eating habits, low-carbohydrate diet, low-sugar diet, regular aerobic exercise (at least 150 minutes a week as tolerated) and medication compliance to achieve or maintain control of diabetes. Counseled on medication adherence/adverse effects. - Follow-up with primary provider as scheduled. - POCT glycosylated hemoglobin (Hb A1C) - Urine Albumin/Creatinine with ratio (send out) [LAB689] - tirzepatide (MOUNJARO) 10 MG/0.5ML Pen; Inject 10 mg into the skin once a week.  Dispense: 6 mL; Refill: 0  5. Encounter for diabetic foot exam [facility]) - Referral to Podiatry for evaluation/management. - Ambulatory referral to Podiatry  6. Hyperlipidemia, unspecified hyperlipidemia type - Continue Rosuvastatin as prescribed. Counseled on medication adherence/adverse effects. No refills  needed as of present.  - Keep all scheduled appointments with Cardiology.  - Follow-up with primary provider as scheduled.  7. Anxiety and depression - Patient denies thoughts of self-harm, suicidal ideations, homicidal ideations. - Trial Escitalopram and Hydroxyzine as prescribed. Counseled on medication adherence/adverse effects.  - Referral to Psychiatry for evaluation/management.  - Follow-up with primary provider as scheduled. - Ambulatory referral to Psychiatry - escitalopram (LEXAPRO) 5 MG tablet; Take 1 tablet (5 mg total) by mouth daily.  Dispense: 90 tablet; Refill: 0 - hydrOXYzine (VISTARIL) 25 MG capsule; Take 1 capsule (25 mg total) by mouth every 8 (eight) hours as needed.  Dispense: 30 capsule; Refill: 2  8. Gastroesophageal reflux disease, unspecified whether esophagitis present - Continue present management. Counseled on medication adherence/adverse effects. No refills needed as of present.  - Keep all scheduled appointments with Gastroenterology.  - Follow-up with primary provider as scheduled.    Patient was given clear instructions to go to Emergency Department or return to medical center if symptoms don't improve, worsen, or new problems develop.The patient verbalized understanding.  I discussed the assessment and treatment plan with the patient. The patient  was provided an opportunity to ask questions and all were answered. The patient agreed with the plan and demonstrated an understanding of the instructions.   The patient was advised to call back or seek an in-person evaluation if the symptoms worsen or if the condition fails to improve as anticipated.    Lavona Pounds, NP 11/01/2023, 11:49 AM Primary Care at [facility]

## 2023-11-04 LAB — MICROALBUMIN / CREATININE URINE RATIO
Creatinine, Urine: 300.1 mg/dL
Microalb/Creat Ratio: 20 mg/g{creat} (ref 0–29)
Microalbumin, Urine: 60.7 ug/mL

## 2023-11-06 ENCOUNTER — Encounter: Payer: Self-pay | Admitting: Cardiology

## 2023-11-06 ENCOUNTER — Other Ambulatory Visit: Payer: Self-pay | Admitting: Cardiology

## 2023-11-06 ENCOUNTER — Ambulatory Visit

## 2023-11-06 ENCOUNTER — Ambulatory Visit: Attending: Cardiology | Admitting: Cardiology

## 2023-11-06 VITALS — BP 130/87 | HR 130 | Ht 62.0 in

## 2023-11-06 DIAGNOSIS — R0602 Shortness of breath: Secondary | ICD-10-CM

## 2023-11-06 DIAGNOSIS — I10 Essential (primary) hypertension: Secondary | ICD-10-CM

## 2023-11-06 DIAGNOSIS — R079 Chest pain, unspecified: Secondary | ICD-10-CM | POA: Diagnosis not present

## 2023-11-06 DIAGNOSIS — G4719 Other hypersomnia: Secondary | ICD-10-CM

## 2023-11-06 DIAGNOSIS — R002 Palpitations: Secondary | ICD-10-CM | POA: Diagnosis not present

## 2023-11-06 MED ORDER — METOPROLOL TARTRATE 100 MG PO TABS: ORAL_TABLET | ORAL | 0 refills | Status: DC

## 2023-11-06 NOTE — Progress Notes (Signed)
 Cardiology CONSULT Note    Date:  11/06/2023   ID:  [name], DOB 12/15/1970, MRN [id number]  PCP:  Senaida Dama, NP  Cardiologist:  Gaylyn Keas, MD   Chief Complaint  Patient presents with   New Patient (Initial Visit)    Chest pain, shortness of breath    Patient Profile: [name] is a 53 y.o. female who is being seen today for the evaluation of capitation's at the request of Buena Carmine, NP.  History of Present Illness:  Veronica CABRIALES is a 53 y.o. female who is being seen today for the evaluation of palpitations at the request of Buena Carmine, NP.  This is a 53 year old African-American female with history of diabetes mellitus 2 and hypertension.  She was seen in the emergency room on 10/21/2023 with complaints of chest heaviness, shortness of breath and palpitations.  She apparently had been having chest heaviness and shortness of breath 2 days ongoing prior to being seen in the ER.  She would notices symptoms especially at night when she be laying down feeling a sensation like "balloon expanding in the middle of my chest" making her feel short of breath.  She also had noted some leg swelling.  She had been under a lot of stress recently with the passing of her husband in January and lost her job in December.  Workup in the ER showed that high-sensitivity troponin were 3/28 and 3.  Chest x-ray was unremarkable.  No evidence of arrhythmias on telemetry.  D-dimer was negative.  She presented back to the ER 2 days later because of ongoing palpitations and an itching/burning sensation in her chest.  She was started on Protonix  20 mg twice daily and referred to cardiology for cardiac workup.  She tells me that the first time she had sx it work her from sleep with her heart racing and SOB.  She got scared and went to the ER but waited so long and went back home.  She could not get back to sleep because her heart was racing so fast and workup was normal  including EKGs and tele.  They felt sx were related to GERD and sent home.  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 She never slept well when her husband was ill because she was awake with every sound he made.    Now she has a hard time getting to sleep because she gets very anxious and when she does get to sleep she wakes up. Her PCP gave her  Lexapro  and hydroxyzine  to help with anxiety and sleep.    She tells me that the SOB only occurs at night when she goes to lay down and gets anxious and does not want to go to sleep.  She has no SOB or DOE during the day.  She denies any chest pain or pressure since the ER when she was started on meds for GERD. She occasionally has some mild LE edema but does admit to using table salt.  She has never smoked before.  Her MGM had heart problems but she does not know the specifics. She has no palpitations during the day.  The only time she gets palpitations is when she starts to think about laying down to go to bed and starts to get anxious.  Past Medical History:  Diagnosis Date   Anemia    Diabetes mellitus    Type 2   Fibroid    Hypertension    Infection    UTI   Vaginal delivery [phone]   Vaginal Pap smear, abnormal     Past Surgical History:  Procedure Laterality Date   ABDOMINAL HYSTERECTOMY     KNEE SURGERY     x 3   ROBOTIC ASSISTED TOTAL HYSTERECTOMY WITH SALPINGECTOMY Bilateral 12/22/2015   Procedure: ROBOTIC ASSISTED TOTAL HYSTERECTOMY WITH BILATERAL SALPINGECTOMY;  Surgeon: Lenord Radon, MD;  Location: [facility];  Service: Gynecology;  Laterality: Bilateral;    Current Medications: Current Meds  Medication Sig   amLODipine (NORVASC) 5 MG tablet Take 5 mg by mouth daily.   escitalopram  (LEXAPRO ) 5 MG tablet Take 1 tablet (5 mg total) by mouth daily.   hydrOXYzine  (VISTARIL ) 25 MG  capsule Take 1 capsule (25 mg total) by mouth every 8 (eight) hours as needed.   metFORMIN  (GLUCOPHAGE ) 1000 MG tablet metformin  1,000 mg tablet  TAKE 1 TABLET TWICE A DAY BY ORAL ROUTE FOR 90 DAYS.   nateglinide (STARLIX) 120 MG tablet Take 120 mg by mouth 3 (three) times daily.   pantoprazole  (PROTONIX ) 20 MG tablet Take 1 tablet (20 mg total) by mouth 2 (two) times daily before a meal.   rosuvastatin (CRESTOR) 5 MG tablet Take 5 mg by mouth daily.   tirzepatide (MOUNJARO) 10 MG/0.5ML Pen Inject 10 mg into the skin once a week.    Allergies:   Aloe, Oxycodone-acetaminophen, Bactrim [sulfamethoxazole -trimethoprim], and Percocet [oxycodone-acetaminophen]   Social History   Socioeconomic History   Marital status: Married    Spouse name: Not on file   Number of children: Not on file   Years of education: Not on file   Highest education level: Not on file  Occupational History   Not on file  Tobacco Use   Smoking status: Never   Smokeless tobacco: Never  Vaping Use   Vaping status: Never Used  Substance and Sexual Activity   Alcohol use: No   Drug use: No   Sexual activity: Yes    Birth control/protection: Post-menopausal  Other Topics Concern   Not on file  Social History Narrative   Not on file   Social Drivers of Health   Financial Resource Strain: Low Risk  (11/01/2023)   Overall Financial Resource Strain (CARDIA)    Difficulty of Paying Living Expenses: Not hard at all  Food Insecurity: No Food Insecurity (11/01/2023)   Hunger Vital Sign    Worried About Running Out of Food in the Last Year: Never true    Ran Out of Food in the Last Year: Never true  Transportation Needs: No Transportation Needs (11/01/2023)   PRAPARE - Administrator, Civil Service (Medical): No    Lack of Transportation (Non-Medical): No  Physical Activity: Inactive (11/01/2023)   Exercise Vital Sign    Days of Exercise per Week: 0 days    Minutes of Exercise per Session: 0 min  Stress: No  Stress Concern Present (11/01/2023)   [employer] of Occupational Health - Occupational Stress Questionnaire    Feeling of Stress : Not at all  Social Connections: Moderately Isolated (11/01/2023)   Social Connection and Isolation Panel [NHANES]    Frequency of Communication with Friends and Family: Twice a week    Frequency of Social Gatherings with Friends and Family: Twice a week    Attends Religious Services: 1  to 4 times per year    Active Member of Clubs or Organizations: No    Attends Banker Meetings: Never    Marital Status: Never married     Family History:  The patient's family history includes Breast cancer in her maternal aunt; Diabetes in her father; Hypertension in her father, maternal grandfather, maternal grandmother, mother, paternal grandfather, and paternal grandmother.   ROS:   Please see the history of present illness.    ROS All other systems reviewed and are negative.     11/06/2023   10:11 AM  PAD Screen  Previous PAD dx? No  Previous surgical procedure? No  Pain with walking? Yes  Subsides with rest? Yes  Feet/toe relief with dangling? No  Painful, non-healing ulcers? No  Extremities discolored? No       PHYSICAL EXAM:   VS:  BP 130/87   Pulse (!) 130   Ht 5\' 2"  (1.575 m)   LMP 10/02/2015   SpO2 97%   BMI 39.80 kg/m    GEN: Well nourished, well developed, in no acute distress  HEENT: normal  Neck: no JVD, carotid bruits, or masses Cardiac: RRR; no murmurs, rubs, or gallops,no edema.  Intact distal pulses bilaterally.  Respiratory:  clear to auscultation bilaterally, normal work of breathing GI: soft, nontender, nondistended, + BS MS: no deformity or atrophy  Skin: warm and dry, no rash Neuro:  Alert and Oriented x 3, Strength and sensation are intact Psych: euthymic mood, full affect  Wt Readings from Last 3 Encounters:  11/01/23 217 lb 9.6 oz (98.7 kg)  10/21/23 227 lb (103 kg)  10/20/23 227 lb (103 kg)       Studies/Labs Reviewed:           Recent Labs: 10/20/2023: BUN 11; Creatinine, Ser 0.71; Hemoglobin 13.8; Platelets 235; Potassium 3.5; Sodium 139 10/21/2023: B Natriuretic Peptide 23.2   Lipid Panel    Component Value Date/Time   CHOL 221 (H) 02/24/2012 1013   TRIG 95 02/24/2012 1013   HDL 44 02/24/2012 1013   CHOLHDL 5.0 02/24/2012 1013   VLDL 19 02/24/2012 1013   LDLCALC 158 (H) 02/24/2012 1013     Additional studies/ records that were reviewed today include:  ER records including EKGs and labs    ASSESSMENT:    1. Chest pain of uncertain etiology   2. SOB (shortness of breath)   3. Palpitations   4. Benign essential HTN      PLAN:  In order of problems listed above:  #Chest pain #Shortness of breath - She has had 2 ER visits back-to-back last month for the symptoms with normal workup and presumed to be due to GERD>>her sx of SOB only occur at night when it is time to go to bed and she will start having palpitations and get SOB.  Sometimes when she is asleep she will wake up in a panic with palpitations and SOB.  She never has DOE or CP with exertional activities.  The chest discomfort she was having at night resolved with PPI and felt to be related to GERD - She is also been under a lot of stress given the loss of her husband in January and losing her job last December - EKGs in the ER were nonischemic - She does have cardiac risk factors including diabetes 2 and hypertension - I have recommended a coronary CTA to define coronary anatomy and rule out CAD - Check 2D echo to assess LV function and  rule out valvular heart disease - she is being referred to a therapist for help - I told her she is fine to take the hydroxyzine  and Lexapro   #Palpitations - Suspect this is more due to anxiety - EKG done in the ER was normal and no arrhythmias on telemetry at that time - Will get a 2-week Zio patch to assess for arrhythmias  #Hypertension -Continue amlodipine 5  mg daily with as needed refills -Will check a home sleep study to rule out  Time Spent: 20 minutes total time of encounter, including 15 minutes spent in face-to-face patient care on the date of this encounter. This time includes coordination of care and counseling regarding above mentioned problem list. Remainder of non-face-to-face time involved reviewing chart documents/testing relevant to the patient encounter and documentation in the medical record. I have independently reviewed documentation from referring provider  Followup:  PRN  Medication Adjustments/Labs and Tests Ordered: Current medicines are reviewed at length with the patient today.  Concerns regarding medicines are outlined above.  Medication changes, Labs and Tests ordered today are listed in the Patient Instructions below.  There are no Patient Instructions on file for this visit.   Signed, Gaylyn Keas, MD  11/06/2023 1:49 PM    [facility] [address] [place], [place], [place]  [address] Phone: [phone]; Fax: [phone]

## 2023-11-06 NOTE — Progress Notes (Unsigned)
 Enrolled for [employer] to mail a ZIO XT long term holter monitor to the patients address on file.

## 2023-11-06 NOTE — Addendum Note (Signed)
 Addended by: Cherylyn Cos on: 11/06/2023 02:17 PM   Modules accepted: Orders

## 2023-11-06 NOTE — Addendum Note (Signed)
 Addended by: Render Carrie on: 11/06/2023 02:36 PM   Modules accepted: Orders

## 2023-11-06 NOTE — Patient Instructions (Addendum)
 Medication Instructions:  Your physician recommends that you continue on your current medications as directed. Please refer to the Current Medication list given to you today.  *If you need a refill on your cardiac medications before your next appointment, please call your pharmacy*  Lab Work: None.  If you have labs (blood work) drawn today and your tests are completely normal, you will receive your results only by: MyChart Message (if you have MyChart) OR A paper copy in the mail If you have any lab test that is abnormal or we need to change your treatment, we will call you to review the results.  Testing/Procedures: Your physician has requested that you have an echocardiogram. Echocardiography is a painless test that uses sound waves to create images of your heart. It provides your doctor with information about the size and shape of your heart and how well your heart's chambers and valves are working. This procedure takes approximately one hour. There are no restrictions for this procedure. Please do NOT wear cologne, perfume, aftershave, or lotions (deodorant is allowed). Please arrive 15 minutes prior to your appointment time.  Please note: We ask at that you not bring children with you during ultrasound (echo/ vascular) testing. Due to room size and safety concerns, children are not allowed in the ultrasound rooms during exams. Our front office staff cannot provide observation of children in our lobby area while testing is being conducted. An adult accompanying a patient to their appointment will only be allowed in the ultrasound room at the discretion of the ultrasound technician under special circumstances. We apologize for any inconvenience.  Your physician has recommended that you have a home sleep study. This test records several body functions during sleep, including: brain activity, eye movement, oxygen and carbon dioxide blood levels, heart rate and rhythm, breathing rate and rhythm,  the flow of air through your mouth and nose, snoring, body muscle movements, and chest and belly movement.  ZIO XT- Long Term Monitor Instructions  Your physician has requested you wear a ZIO patch monitor for 14 days.  This is a single patch monitor. [employer] supplies one patch monitor per enrollment. Additional stickers are not available. Please do not apply patch if you will be having a Nuclear Stress Test,  Echocardiogram, Cardiac CT, MRI, or Chest Xray during the period you would be wearing the  monitor. The patch cannot be worn during these tests. You cannot remove and re-apply the  ZIO XT patch monitor.  Your ZIO patch monitor will be mailed 3 day USPS to your address on file. It may take 3-5 days  to receive your monitor after you have been enrolled.  Once you have received your monitor, please review the enclosed instructions. Your monitor  has already been registered assigning a specific monitor serial # to you.  Billing and Patient Assistance Program Information  We have supplied [employer] with any of your insurance information on file for billing purposes. [employer] offers a sliding scale Patient Assistance Program for patients that do not have  insurance, or whose insurance does not completely cover the cost of the ZIO monitor.  You must apply for the Patient Assistance Program to qualify for this discounted rate.  To apply, please call [employer] at [phone], select option 4, select option 2, ask to apply for  Patient Assistance Program. Sanna Crystal will ask your household income, and how many people  are in your household. They will quote your out-of-pocket cost based on that information.  [employer] will also  be able to set up a 64-month, interest-free payment plan if needed.  Applying the monitor   Shave hair from upper left chest.  Hold abrader disc by orange tab. Rub abrader in 40 strokes over the upper left chest as  indicated in your monitor instructions.  Clean area with 4  enclosed alcohol pads. Let dry.  Apply patch as indicated in monitor instructions. Patch will be placed under collarbone on left  side of chest with arrow pointing upward.  Rub patch adhesive wings for 2 minutes. Remove white label marked "1". Remove the white  label marked "2". Rub patch adhesive wings for 2 additional minutes.  While looking in a mirror, press and release button in center of patch. A small green light will  flash 3-4 times. This will be your only indicator that the monitor has been turned on.  Do not shower for the first 24 hours. You may shower after the first 24 hours.  Press the button if you feel a symptom. You will hear a small click. Record Date, Time and  Symptom in the Patient Logbook.  When you are ready to remove the patch, follow instructions on the last 2 pages of Patient  Logbook. Stick patch monitor onto the last page of Patient Logbook.  Place Patient Logbook in the blue and white box. Use locking tab on box and tape box closed  securely. The blue and white box has prepaid postage on it. Please place it in the mailbox as  soon as possible. Your physician should have your test results approximately 7 days after the  monitor has been mailed back to [facility].  Call [facility] Customer Care at [phone] if you have questions regarding  your ZIO XT patch monitor. Call them immediately if you see an orange light blinking on your  monitor.  If your monitor falls off in less than 4 days, contact our Monitor department at [phone].  If your monitor becomes loose or falls off after 4 days call [employer] at [phone] for  suggestions on securing your monitor   Follow-Up: At [facility], you and your health needs are our priority.  As part of our continuing mission to provide you with exceptional heart care, our providers are all part of one team.  This team includes your primary Cardiologist (physician) and Advanced Practice Providers or  APPs (Physician Assistants and Nurse Practitioners) who all work together to provide you with the care you need, when you need it.    Your cardiac CT will be scheduled at   [facility] [address]  [place], [place] [address] Opening October 23, 2023   If scheduled at the Heart and Vascular Tower at [employer], please enter the parking lot using the [address] street entrance and use the FREE valet service at the patient drop-off area. Enter the buidling and check-in with registration on the main floor.   Please follow these instructions carefully (unless otherwise directed):  An IV will be required for this test and Nitroglycerin will be given.     On the Night Before the Test: Be sure to Drink plenty of water . Do not consume any caffeinated/decaffeinated beverages or chocolate 12 hours prior to your test. Do not take any antihistamines 12 hours prior to your test.  On the Day of the Test: Drink plenty of water  until 1 hour prior to the test. Do not eat any food 1 hour prior to test. You may take your  regular medications prior to the test.  Take metoprolol (Lopressor) 100 mg two hours prior to test. FEMALES- please wear underwire-free bra if available, avoid dresses & tight clothing  After the Test: Drink plenty of water . After receiving IV contrast, you may experience a mild flushed feeling. This is normal. On occasion, you may experience a mild rash up to 24 hours after the test. This is not dangerous. If this occurs, you can take Benadryl 25 mg, Zyrtec, Claritin, or Allegra and increase your fluid intake. (Patients taking Tikosyn should avoid Benadryl, and may take Zyrtec, Claritin, or Allegra) If you experience trouble breathing, this can be serious. If it is severe call 911 IMMEDIATELY. If it is mild, please call our office.  We will call to schedule your test 2-4 weeks out understanding that some insurance companies will need an  authorization prior to the service being performed.   For more information and frequently asked questions, please visit our website : [email or web address]  For non-scheduling related questions, please contact the cardiac imaging nurse navigator should you have any questions/concerns: Cardiac Imaging Nurse Navigators Direct Office Dial: [phone]   For scheduling needs, including cancellations and rescheduling, please call [place], [phone].    Your next appointment will be dependent on the results of your testing and it will be with:     Provider:   Dr.  Gaylyn Keas, MD

## 2023-11-08 ENCOUNTER — Encounter: Admitting: Physical Therapy

## 2023-11-23 ENCOUNTER — Ambulatory Visit: Admitting: Family

## 2023-11-28 ENCOUNTER — Ambulatory Visit: Payer: Self-pay | Admitting: Cardiology

## 2023-11-28 DIAGNOSIS — R002 Palpitations: Secondary | ICD-10-CM | POA: Diagnosis not present

## 2023-11-28 DIAGNOSIS — R0602 Shortness of breath: Secondary | ICD-10-CM | POA: Diagnosis not present

## 2023-11-28 DIAGNOSIS — R079 Chest pain, unspecified: Secondary | ICD-10-CM

## 2023-11-30 ENCOUNTER — Encounter: Payer: Self-pay | Admitting: Family

## 2023-11-30 MED ORDER — METOPROLOL SUCCINATE ER 25 MG PO TB24: 25.0000 mg | ORAL_TABLET | Freq: Every day | ORAL | 3 refills | Status: DC

## 2023-11-30 NOTE — Telephone Encounter (Signed)
-----   Message from Gaylyn Keas sent at 11/28/2023  7:48 PM EDT ----- Heart monitor showed normal rhythm with 7 episodes of SVT (fast heart beat from the top of the heart) up to 13 beats at a time and rare extra heart beats from the top of the heart (PACs) and bottom fo the heart  (PVCs).  Stop Amlodipine and start Toprol  XL 25mg  daily.  Check BP and HR twice daily for a week and call with results.  FOllowup with PA in 4-6 weeks

## 2023-11-30 NOTE — Telephone Encounter (Signed)
 Call to advise patient of heart monitor results. Advised patient via [facility] to stop Amlodipine and start Toprol  XL 25mg  daily.  Also asked patient to check BP and HR twice daily for a week and call with results.  Patient verbalizes understanding. FOllowup with PA in 4-6 weeks scheduled. Orders placed.

## 2023-12-04 ENCOUNTER — Telehealth: Payer: Self-pay

## 2023-12-04 ENCOUNTER — Ambulatory Visit (INDEPENDENT_AMBULATORY_CARE_PROVIDER_SITE_OTHER): Admitting: Family

## 2023-12-04 ENCOUNTER — Encounter: Payer: Self-pay | Admitting: Family

## 2023-12-04 VITALS — BP 120/85 | HR 102 | Temp 98.0°F | Resp 16 | Ht 62.0 in | Wt 216.4 lb

## 2023-12-04 DIAGNOSIS — Z5320 Procedure and treatment not carried out because of patient's decision for unspecified reasons: Secondary | ICD-10-CM

## 2023-12-04 DIAGNOSIS — Z130 Encounter for screening for diseases of the blood and blood-forming organs and certain disorders involving the immune mechanism: Secondary | ICD-10-CM | POA: Diagnosis not present

## 2023-12-04 DIAGNOSIS — Z0000 Encounter for general adult medical examination without abnormal findings: Secondary | ICD-10-CM

## 2023-12-04 DIAGNOSIS — F32A Depression, unspecified: Secondary | ICD-10-CM

## 2023-12-04 DIAGNOSIS — Z13228 Encounter for screening for other metabolic disorders: Secondary | ICD-10-CM

## 2023-12-04 DIAGNOSIS — Z1329 Encounter for screening for other suspected endocrine disorder: Secondary | ICD-10-CM | POA: Diagnosis not present

## 2023-12-04 DIAGNOSIS — Z114 Encounter for screening for human immunodeficiency virus [HIV]: Secondary | ICD-10-CM

## 2023-12-04 DIAGNOSIS — Z124 Encounter for screening for malignant neoplasm of cervix: Secondary | ICD-10-CM

## 2023-12-04 DIAGNOSIS — Z1159 Encounter for screening for other viral diseases: Secondary | ICD-10-CM

## 2023-12-04 DIAGNOSIS — F419 Anxiety disorder, unspecified: Secondary | ICD-10-CM

## 2023-12-04 MED ORDER — HYDROXYZINE PAMOATE 25 MG PO CAPS
25.0000 mg | ORAL_CAPSULE | Freq: Three times a day (TID) | ORAL | 2 refills | Status: AC | PRN
Start: 2023-12-04 — End: ?

## 2023-12-04 MED ORDER — ESCITALOPRAM OXALATE 5 MG PO TABS: 5.0000 mg | ORAL_TABLET | Freq: Every day | ORAL | 0 refills | Status: DC

## 2023-12-04 NOTE — Progress Notes (Signed)
 No concerns they are being taking care of.  She will go over them with the PCP

## 2023-12-04 NOTE — Progress Notes (Signed)
 Patient ID: [name], female    DOB: 09-23-1970  MRN: [id number]  CC: Annual Exam  Subjective: [name] is a 53 y.o. female who presents for annual exam.   Her concerns today include:  - Upcoming appointment with Ophthalmology.  - Established with Podiatry.  - Established with Cardiology. States she is doing well on medication since cardiologist made changes. She does not complain of red flag symptoms such as but not limited to chest pain, shortness of breath, worst headache of life, nausea/vomiting.  - Upcoming appointment with Gastroenterology.  - Doing well on Escitalopram  and Hydroxyzine , no issues/concerns. She denies thoughts of self-harm, suicidal ideations, homicidal ideations.  Patient Active Problem List   Diagnosis Date Noted   Fibroids 12/22/2015   Post-operative state 12/22/2015   DUB (dysfunctional uterine bleeding) 10/12/2015   Obesity 03/01/2012   Diabetes mellitus (HCC) 10/26/2011     Current Outpatient Medications on File Prior to Visit  Medication Sig Dispense Refill   metFORMIN  (GLUCOPHAGE ) 1000 MG tablet metformin  1,000 mg tablet  TAKE 1 TABLET TWICE A DAY BY ORAL ROUTE FOR 90 DAYS.     metoprolol  succinate (TOPROL  XL) 25 MG 24 hr tablet Take 1 tablet (25 mg total) by mouth daily. 90 tablet 3   metoprolol  tartrate (LOPRESSOR ) 100 MG tablet Take 2 hours prior to CT scan 1 tablet 0   nateglinide (STARLIX) 120 MG tablet Take 120 mg by mouth 3 (three) times daily.     pantoprazole  (PROTONIX ) 20 MG tablet Take 1 tablet (20 mg total) by mouth 2 (two) times daily before a meal. 20 tablet 0   rosuvastatin (CRESTOR) 5 MG tablet Take 5 mg by mouth daily.     tirzepatide (MOUNJARO) 10 MG/0.5ML Pen Inject 10 mg into the skin once a week. 6 mL 0   canagliflozin  (INVOKANA ) 100 MG TABS tablet  (Patient not taking: Reported on 11/06/2023)     dapagliflozin propanediol (FARXIGA) 5 MG TABS tablet Take 1 tablet every day by oral route in the morning. (Patient  not taking: Reported on 11/06/2023)     famotidine  (PEPCID ) 20 MG tablet Take 1 tablet (20 mg total) by mouth 2 (two) times daily. (Patient not taking: Reported on 10/23/2023) 60 tablet 0   [DISCONTINUED] carbamazepine  (TEGRETOL ) 200 MG tablet 200 mg day 1, 200 mg twice daily for day 2 and 3, 400 mg twice daily for day 3 and 4. Use 400 mg twice daily, as needed, on days 5 through 7. 30 tablet 0   [DISCONTINUED] glimepiride  (AMARYL ) 4 MG tablet Take 1 tablet (4 mg total) by mouth daily with breakfast. 30 tablet 1   [DISCONTINUED] lisinopril  (PRINIVIL ,ZESTRIL ) 5 MG tablet Take 5 mg by mouth daily.     [DISCONTINUED] sitaGLIPtin -metformin  (JANUMET ) 50-1000 MG per tablet Take 1 tablet by mouth 2 (two) times daily with a meal.     No current facility-administered medications on file prior to visit.    Allergies  Allergen Reactions   Aloe Other (See Comments)   Oxycodone-Acetaminophen Itching and Other (See Comments)   Bactrim [Sulfamethoxazole -Trimethoprim] Hives   Percocet [Oxycodone-Acetaminophen] Itching    Social History   Socioeconomic History   Marital status: Married    Spouse name: Not on file   Number of children: Not on file   Years of education: Not on file   Highest education level: Not on file  Occupational History   Not on file  Tobacco Use   Smoking status: Never   Smokeless  tobacco: Never  Vaping Use   Vaping status: Never Used  Substance and Sexual Activity   Alcohol use: No   Drug use: No   Sexual activity: Yes    Birth control/protection: Post-menopausal  Other Topics Concern   Not on file  Social History Narrative   Not on file   Social Drivers of Health   Financial Resource Strain: Low Risk  (11/01/2023)   Overall Financial Resource Strain (CARDIA)    Difficulty of Paying Living Expenses: Not hard at all  Food Insecurity: No Food Insecurity (11/01/2023)   Hunger Vital Sign    Worried About Running Out of Food in the Last Year: Never true    Ran Out of Food  in the Last Year: Never true  Transportation Needs: No Transportation Needs (11/01/2023)   PRAPARE - Administrator, Civil Service (Medical): No    Lack of Transportation (Non-Medical): No  Physical Activity: Inactive (11/01/2023)   Exercise Vital Sign    Days of Exercise per Week: 0 days    Minutes of Exercise per Session: 0 min  Stress: No Stress Concern Present (11/01/2023)   [employer] of Occupational Health - Occupational Stress Questionnaire    Feeling of Stress : Not at all  Social Connections: Moderately Isolated (11/01/2023)   Social Connection and Isolation Panel [NHANES]    Frequency of Communication with Friends and Family: Twice a week    Frequency of Social Gatherings with Friends and Family: Twice a week    Attends Religious Services: 1 to 4 times per year    Active Member of [employer] or Organizations: No    Attends Banker Meetings: Never    Marital Status: Never married  Intimate Partner Violence: Not At Risk (11/01/2023)   Humiliation, Afraid, Rape, and Kick questionnaire    Fear of Current or Ex-Partner: No    Emotionally Abused: No    Physically Abused: No    Sexually Abused: No    Family History  Problem Relation Age of Onset   Hypertension Mother    Diabetes Father    Hypertension Father    Hypertension Maternal Grandmother    Hypertension Maternal Grandfather    Hypertension Paternal Grandmother    Hypertension Paternal Grandfather    Breast cancer Maternal Aunt     Past Surgical History:  Procedure Laterality Date   ABDOMINAL HYSTERECTOMY     KNEE SURGERY     x 3   ROBOTIC ASSISTED TOTAL HYSTERECTOMY WITH SALPINGECTOMY Bilateral 12/22/2015   Procedure: ROBOTIC ASSISTED TOTAL HYSTERECTOMY WITH BILATERAL SALPINGECTOMY;  Surgeon: Lenord Radon, MD;  Location: [facility];  Service: Gynecology;  Laterality: Bilateral;    ROS: Review of Systems Negative except as stated above  PHYSICAL EXAM: BP 120/85   Pulse (!) 102    Temp 98 F (36.7 C) (Oral)   Resp 16   Ht 5\' 2"  (1.575 m)   Wt 216 lb 6.4 oz (98.2 kg)   LMP 10/02/2015   SpO2 96%   BMI 39.58 kg/m   Physical Exam HENT:     Head: Normocephalic and atraumatic.     Right Ear: Tympanic membrane, ear canal and external ear normal.     Left Ear: Tympanic membrane, ear canal and external ear normal.     Nose: Nose normal.     Mouth/Throat:     Mouth: Mucous membranes are moist.     Pharynx: Oropharynx is clear.  Eyes:     Extraocular Movements: Extraocular movements  intact.     Conjunctiva/sclera: Conjunctivae normal.     Pupils: Pupils are equal, round, and reactive to light.  Neck:     Thyroid: No thyroid mass, thyromegaly or thyroid tenderness.  Cardiovascular:     Rate and Rhythm: Tachycardia present.     Pulses: Normal pulses.     Heart sounds: Normal heart sounds.  Pulmonary:     Effort: Pulmonary effort is normal.     Breath sounds: Normal breath sounds.  Chest:     Comments: Patient declined. Abdominal:     General: Bowel sounds are normal.     Palpations: Abdomen is soft.  Genitourinary:    Comments: Patient declined. Musculoskeletal:        General: Normal range of motion.     Right shoulder: Normal.     Left shoulder: Normal.     Right upper arm: Normal.     Left upper arm: Normal.     Right elbow: Normal.     Left elbow: Normal.     Right forearm: Normal.     Left forearm: Normal.     Right wrist: Normal.     Left wrist: Normal.     Right hand: Normal.     Left hand: Normal.     Cervical back: Normal, normal range of motion and neck supple.     Thoracic back: Normal.     Lumbar back: Normal.     Right hip: Normal.     Left hip: Normal.     Right upper leg: Normal.     Left upper leg: Normal.     Right knee: Normal.     Left knee: Normal.     Right lower leg: Normal.     Left lower leg: Normal.     Right ankle: Normal.     Left ankle: Normal.     Right foot: Normal.     Left foot: Normal.  Skin:    General:  Skin is warm and dry.     Capillary Refill: Capillary refill takes less than 2 seconds.  Neurological:     General: No focal deficit present.     Mental Status: She is alert and oriented to person, place, and time.  Psychiatric:        Mood and Affect: Mood normal.        Behavior: Behavior normal.     ASSESSMENT AND PLAN: 1. Annual physical exam (Primary) - Counseled on 150 minutes of exercise per week as tolerated, healthy eating (including decreased daily intake of saturated fats, cholesterol, added sugars, sodium), STI prevention, and routine healthcare maintenance.  2. Screening for metabolic disorder - Routine screening.  - CMP14+EGFR  3. Screening for deficiency anemia - Routine screening.  - CBC  4. Thyroid disorder screen - Routine screening.  - TSH  5. Cervical cancer screening - Referral to Gynecology for evaluation/management. - Ambulatory referral to Gynecology  6. Colon cancer screening declined - Patient declined.  7. Encounter for screening for HIV - Routine screening.  - HIV antibody (with reflex)  8. Need for hepatitis C screening test - Routine screening.  - Hepatitis C Antibody  9. Anxiety and depression - Patient denies thoughts of self-harm, suicidal ideations, homicidal ideations. - Continue Escitalopram  and Hydroxyzine  as prescribed. Counseled on medication adherence/adverse effects. - Follow-up with primary provider in 3 months or sooner if needed. - escitalopram  (LEXAPRO ) 5 MG tablet; Take 1 tablet (5 mg total) by mouth daily.  Dispense: 90 tablet;  Refill: 0 - hydrOXYzine  (VISTARIL ) 25 MG capsule; Take 1 capsule (25 mg total) by mouth every 8 (eight) hours as needed.  Dispense: 30 capsule; Refill: 2   Patient was given the opportunity to ask questions.  Patient verbalized understanding of the plan and was able to repeat key elements of the plan. Patient was given clear instructions to go to Emergency Department or return to medical center if  symptoms don't improve, worsen, or new problems develop.The patient verbalized understanding.   Orders Placed This Encounter  Procedures   CMP14+EGFR   TSH   CBC   HIV antibody (with reflex)   Hepatitis C Antibody   Ambulatory referral to Gynecology     Requested Prescriptions   Signed Prescriptions Disp Refills   escitalopram  (LEXAPRO ) 5 MG tablet 90 tablet 0    Sig: Take 1 tablet (5 mg total) by mouth daily.   hydrOXYzine  (VISTARIL ) 25 MG capsule 30 capsule 2    Sig: Take 1 capsule (25 mg total) by mouth every 8 (eight) hours as needed.    Return in about 1 year (around 12/03/2024) for Physical per patient preference.  Senaida Dama, NP

## 2023-12-04 NOTE — Telephone Encounter (Signed)
Discussed with patient today in office

## 2023-12-04 NOTE — Telephone Encounter (Signed)
**Note De-Identified [name] Obfuscation** Ordering provider: Dr Micael Adas Associated diagnoses: HTN-I10 and Palpitations-R00.2  WatchPAT PA obtained on 12/04/2023 by [name], Veronica Mao, LPN. Authorization: Per the [employer] NEXT website: [employer] does not require a prior authorization for CPT Code: 40981 ([place]-HST).  Patient notified of PIN (1234) on 12/04/2023 [name] Notification Method: phone.  Phone note routed to covering staff for follow-up.

## 2023-12-05 ENCOUNTER — Ambulatory Visit: Payer: Self-pay | Admitting: Family

## 2023-12-05 DIAGNOSIS — Z13220 Encounter for screening for lipoid disorders: Secondary | ICD-10-CM

## 2023-12-05 LAB — CBC
Hematocrit: 44.2 % (ref 34.0–46.6)
Hemoglobin: 14.3 g/dL (ref 11.1–15.9)
MCH: 29.5 pg (ref 26.6–33.0)
MCHC: 32.4 g/dL (ref 31.5–35.7)
MCV: 91 fL (ref 79–97)
Platelets: 257 10*3/uL (ref 150–450)
RBC: 4.84 x10E6/uL (ref 3.77–5.28)
RDW: 14.4 % (ref 11.7–15.4)
WBC: 6.4 10*3/uL (ref 3.4–10.8)

## 2023-12-05 LAB — CMP14+EGFR
ALT: 14 IU/L (ref 0–32)
AST: 16 IU/L (ref 0–40)
Albumin: 4.2 g/dL (ref 3.8–4.9)
Alkaline Phosphatase: 58 IU/L (ref 44–121)
BUN/Creatinine Ratio: 19 (ref 9–23)
BUN: 11 mg/dL (ref 6–24)
Bilirubin Total: 0.2 mg/dL (ref 0.0–1.2)
CO2: 21 mmol/L (ref 20–29)
Calcium: 9.3 mg/dL (ref 8.7–10.2)
Chloride: 104 mmol/L (ref 96–106)
Creatinine, Ser: 0.59 mg/dL (ref 0.57–1.00)
Globulin, Total: 2.9 g/dL (ref 1.5–4.5)
Glucose: 99 mg/dL (ref 70–99)
Potassium: 4.1 mmol/L (ref 3.5–5.2)
Sodium: 142 mmol/L (ref 134–144)
Total Protein: 7.1 g/dL (ref 6.0–8.5)
eGFR: 108 mL/min/{1.73_m2} (ref >59)

## 2023-12-05 LAB — TSH: TSH: 0.931 u[IU]/mL (ref 0.450–4.500)

## 2023-12-05 LAB — HEPATITIS C ANTIBODY: Hep C Virus Ab: NONREACTIVE

## 2023-12-05 LAB — HIV ANTIBODY (ROUTINE TESTING W REFLEX): HIV Screen 4th Generation wRfx: NONREACTIVE

## 2023-12-06 ENCOUNTER — Telehealth: Payer: Self-pay

## 2023-12-06 NOTE — Telephone Encounter (Signed)
 Copied from CRM [id number]. Topic: Clinical - Lab/Test Results >> Dec 06, 2023 11:22 AM Baldomero Bone wrote: Reason for CRM: Patient is returning a call from [place] about lab results. Callback number is [phone]

## 2023-12-06 NOTE — Telephone Encounter (Signed)
 Done

## 2023-12-06 NOTE — Telephone Encounter (Signed)
 Patient given labs results per provider - Kidney function normal. - Liver function normal. - Thyroid function normal. - No anemia. - HIV non reactive. - Hepatitis C non reactive. - All other values are normal, stable or within acceptable limits. Call patient with update. Lipid panel added to lab order. Schedule patient a lab only appointment to have collected. Please ask patient to fast for lipid panel. Advised patient she would receive a call to schedule lab draw for Lipids . Patient was very Adult nurse.

## 2023-12-07 ENCOUNTER — Encounter: Payer: Self-pay | Admitting: Physician Assistant

## 2023-12-07 ENCOUNTER — Other Ambulatory Visit

## 2023-12-07 ENCOUNTER — Ambulatory Visit: Admitting: Physician Assistant

## 2023-12-07 ENCOUNTER — Telehealth: Payer: Self-pay

## 2023-12-07 VITALS — BP 116/78 | HR 92 | Ht 62.0 in | Wt 216.1 lb

## 2023-12-07 DIAGNOSIS — K219 Gastro-esophageal reflux disease without esophagitis: Secondary | ICD-10-CM

## 2023-12-07 DIAGNOSIS — Z1211 Encounter for screening for malignant neoplasm of colon: Secondary | ICD-10-CM

## 2023-12-07 DIAGNOSIS — Z13220 Encounter for screening for lipoid disorders: Secondary | ICD-10-CM

## 2023-12-07 MED ORDER — SUTAB 1479-225-188 MG PO TABS: ORAL_TABLET | ORAL | 0 refills | Status: DC

## 2023-12-07 NOTE — Telephone Encounter (Signed)
 [facility] Pre-operative Risk Assessment     Request for surgical clearance:     Endoscopy Procedure  What type of surgery is being performed?     Colonoscopy  When is this surgery scheduled?     01/30/24  What type of clearance is required ?   Cardiac  Are there any medications that need to be held prior to surgery and how long? N/A  Practice name and name of physician performing surgery?      [facility]  What is your office phone and fax number?      Phone- [phone]  Fax- [phone]  Anesthesia type (None, local, MAC, general) ?       MAC   Please route your response to Lianne Redo, CMA

## 2023-12-07 NOTE — Progress Notes (Signed)
 Brigitte Canard, PA-C [address] [place], [place]  [address] Phone: [phone]   Gastroenterology Consultation  Referring Provider:     [facility] Primary Care Physician:  Senaida Dama, NP Primary Gastroenterologist:  Brigitte Canard, PA-C / Darol Elizabeth, MD  Reason for Consultation:     GERD, Colonoscopy        HPI:   [name] is a 53 y.o. y/o female referred for consultation & management  by Senaida Dama, NP.  Patient is referred to evaluate acid reflux.    Patient had an episode of chest pain April 2025 which woke her in the middle of her sleep.  She felt heart racing.  Felt extremely anxious like a panic attack.  She has been under a lot of stress.  Her husband passed away from colon cancer 2023-08-05(initially diagnosed age 4).  Patient lost her job in the past few months.  Was recently started on anxiety medicine by her PCP which has helped.  She was also started on Famotidine  20 Mg once daily and chest pain has resolved.  Denies dysphagia.  Had ED evaluation for chest pain 10/21/23.  1 Elevated Troponin.  1 Normal Troponin.   Normal chest x-ray.  Had recent ZIO heart monitor test - Showed 7 episodes of SVT, otherwise normal sinus rhythm.  She is scheduled to have echocardiogram 12/11/2023.  Saw cardiologist Dr. Aubrey Leaf 11/06/2023 to follow-up chest pain.  Currently undergoing cardiac evaluation.  She has not had any recent episodes of chest pain.  01/2022 screening Cologuard negative.  No previous colonoscopy, EGD or GI evaluation.  Patient's mother had 13 colon polyps removed on her last colonoscopy.  Patient denies any family history of colon cancer, however her husband died from colon cancer 02-08-2025at age 76.  Patient wants to proceed with having her first screening colonoscopy.  She denies rectal bleeding, diarrhea, constipation, abdominal pain, or weight loss.  PMH: GERD, diabetes, dysfunctional uterine bleeding, uterine  fibroids, obesity.  Past Medical History:  Diagnosis Date   Anemia    Anxiety    Diabetes mellitus    Type 2   Fibroid    HLD (hyperlipidemia)    Hypertension    Infection    UTI   Obesity    Panic attack    Vaginal delivery [phone]   Vaginal Pap smear, abnormal     Past Surgical History:  Procedure Laterality Date   KNEE SURGERY Bilateral    x 2 left, 1 right   ROBOTIC ASSISTED TOTAL HYSTERECTOMY WITH SALPINGECTOMY Bilateral 12/22/2015   Procedure: ROBOTIC ASSISTED TOTAL HYSTERECTOMY WITH BILATERAL SALPINGECTOMY;  Surgeon: Lenord Radon, MD;  Location: [facility];  Service: Gynecology;  Laterality: Bilateral;    Prior to Admission medications   Medication Sig Start Date End Date Taking? Authorizing Provider  canagliflozin  (INVOKANA ) 100 MG TABS tablet     [provider]  dapagliflozin propanediol (FARXIGA) 5 MG TABS tablet Take 1 tablet every day by oral route in the morning. Patient not taking: Reported on 11/06/2023    [provider]  escitalopram  (LEXAPRO ) 5 MG tablet Take 1 tablet (5 mg total) by mouth daily. 12/04/23   Senaida Dama, NP  famotidine  (PEPCID ) 20 MG tablet Take 1 tablet (20 mg total) by mouth 2 (two) times daily. Patient not taking: Reported on 10/23/2023 10/21/23 11/20/23  Arvilla Birmingham, MD  hydrOXYzine  (VISTARIL ) 25 MG capsule Take 1 capsule (25  mg total) by mouth every 8 (eight) hours as needed. 12/04/23   Senaida Dama, NP  metFORMIN  (GLUCOPHAGE ) 1000 MG tablet metformin  1,000 mg tablet  TAKE 1 TABLET TWICE A DAY BY ORAL ROUTE FOR 90 DAYS.    [provider]  metoprolol  succinate (TOPROL  XL) 25 MG 24 hr tablet Take 1 tablet (25 mg total) by mouth daily. 11/30/23   Jacqueline Matsu, MD  metoprolol  tartrate (LOPRESSOR ) 100 MG tablet Take 2 hours prior to CT scan 11/06/23   Jacqueline Matsu, MD  nateglinide (STARLIX) 120 MG tablet Take 120 mg by mouth 3 (three) times daily. 08/10/21   [provider]   pantoprazole  (PROTONIX ) 20 MG tablet Take 1 tablet (20 mg total) by mouth 2 (two) times daily before a meal. 10/23/23   Buena Carmine, NP  rosuvastatin (CRESTOR) 5 MG tablet Take 5 mg by mouth daily.    [provider]  tirzepatide Florence Hunt) 10 MG/0.5ML Pen Inject 10 mg into the skin once a week. 11/01/23   Senaida Dama, NP  carbamazepine  (TEGRETOL ) 200 MG tablet 200 mg day 1, 200 mg twice daily for day 2 and 3, 400 mg twice daily for day 3 and 4. Use 400 mg twice daily, as needed, on days 5 through 7. 02/28/16 02/11/19  Joy, Shawn C, PA-C  glimepiride  (AMARYL ) 4 MG tablet Take 1 tablet (4 mg total) by mouth daily with breakfast. 12/23/15 02/11/19  Lenord Radon, MD  lisinopril  (PRINIVIL ,ZESTRIL ) 5 MG tablet Take 5 mg by mouth daily.  02/11/19  [provider]  sitaGLIPtin -metformin  (JANUMET ) 50-1000 MG per tablet Take 1 tablet by mouth 2 (two) times daily with a meal.  02/11/19  [provider]    Family History  Problem Relation Age of Onset   Hypertension Mother    Colon polyps Mother    Diabetes Father    Hypertension Father    Diabetes Brother    Hypertension Brother    Clotting disorder Brother    Hypertension Brother    Diabetes Brother    Hypertension Maternal Grandmother    Hypertension Maternal Grandfather    Hypertension Paternal Grandmother    Hypertension Paternal Grandfather    Autism Daughter    Hypertension Daughter    Hypertension Son    Breast cancer Maternal Aunt    Colon cancer Maternal Aunt      Social History   Tobacco Use   Smoking status: Never   Smokeless tobacco: Never  Vaping Use   Vaping status: Never Used  Substance Use Topics   Alcohol use: No   Drug use: No    Allergies as of 12/07/2023 - Review Complete 12/07/2023  Allergen Reaction Noted   Aloe Other (See Comments) 11/01/2023   Oxycodone-acetaminophen Itching and Other (See Comments) 05/28/2011   Bactrim [sulfamethoxazole -trimethoprim] Hives  12/06/2013   Percocet [oxycodone-acetaminophen] Itching 05/28/2011    Review of Systems:    All systems reviewed and negative except where noted in HPI.   Physical Exam:  BP 116/78 (BP Location: Left Arm, Patient Position: Sitting, Cuff Size: Large)   Pulse 92   Ht 5' 2 (1.575 m) Comment: height measured without shoes  Wt 216 lb 2 oz (98 kg)   LMP 10/02/2015   BMI 39.53 kg/m  Patient's last menstrual period was 10/02/2015.  General:   Alert,  Well-developed, well-nourished, pleasant and cooperative in NAD Lungs:  Respirations even and unlabored.  Clear throughout to auscultation.   No wheezes, crackles, or  rhonchi. No acute distress. Heart:  Regular rate and rhythm; no murmurs, clicks, rubs, or gallops. Abdomen:  Normal bowel sounds.  No bruits.  Soft, and non-distended without masses, hepatosplenomegaly or hernias noted.  No Tenderness.  No guarding or rebound tenderness.    Neurologic:  Alert and oriented x3;  grossly normal neurologically. Psych:  Alert and cooperative. Normal mood and affect.  Imaging Studies: LONG TERM MONITOR (3-14 DAYS) Result Date: 11/28/2023   Predominent rhythm was normal sinus rhythm with average heart rate 101bpm and ranged from 68 to1553bpm.   7 episodes of SVT lasting sa long as 13 beats   Rare PACs, atrial couplets   Rare PVCs Patch Wear Time:  13 days and 4 hours (2025-05-15T13:02:10-398 to 2025-05-28T17:02:35-0400) Patient had a min HR of 68 bpm, max HR of 203 bpm, and avg HR of 101 bpm. Predominant underlying rhythm was Sinus Rhythm. 7 Supraventricular Tachycardia runs occurred, the run with the fastest interval lasting 7 beats with a max rate of 203 bpm, the longest lasting 13 beats with an avg rate of 150 bpm. Isolated SVEs were rare (<1.0%), SVE Couplets were rare (<1.0%), and no SVE Triplets were present. Isolated VEs were rare (<1.0%), and no VE Couplets or VE Triplets were present.    Labs: CBC    Component Value Date/Time   WBC 6.4  12/04/2023 0940   WBC 9.2 10/20/2023 2324   RBC 4.84 12/04/2023 0940   RBC 4.77 10/20/2023 2324   HGB 14.3 12/04/2023 0940   HCT 44.2 12/04/2023 0940   PLT 257 12/04/2023 0940   MCV 91 12/04/2023 0940   MCH 29.5 12/04/2023 0940   MCH 28.9 10/20/2023 2324   MCHC 32.4 12/04/2023 0940   MCHC 32.2 10/20/2023 2324   RDW 14.4 12/04/2023 0940   LYMPHSABS 4.1 (H) 06/18/2022 1422   LYMPHSABS 4.7 (H) 06/14/2022 1902   MONOABS 0.7 06/18/2022 1422   EOSABS 0.1 06/18/2022 1422   EOSABS 0.1 06/14/2022 1902   BASOSABS 0.1 06/18/2022 1422   BASOSABS 0.1 06/14/2022 1902    CMP     Component Value Date/Time   NA 142 12/04/2023 0940   K 4.1 12/04/2023 0940   CL 104 12/04/2023 0940   CO2 21 12/04/2023 0940   GLUCOSE 99 12/04/2023 0940   GLUCOSE 102 (H) 10/20/2023 2324   BUN 11 12/04/2023 0940   CREATININE 0.59 12/04/2023 0940   CREATININE 0.64 12/06/2013 1151   CALCIUM 9.3 12/04/2023 0940   PROT 7.1 12/04/2023 0940   ALBUMIN 4.2 12/04/2023 0940   AST 16 12/04/2023 0940   ALT 14 12/04/2023 0940   ALKPHOS 58 12/04/2023 0940   BILITOT 0.2 12/04/2023 0940   GFRNONAA >60 10/20/2023 2324   GFRAA >60 12/23/2015 0512    Assessment and Plan:   NELLA BOTSFORD is a 53 y.o. y/o female has been referred for:   1.  Chest pain / Heart Palpitations / Stress / pSVT - Complete cardiology evaluation of chest pain. - Request Cardiac Clearance for Colonoscopy.  2.  GERD - Resolved. - Continue Famotidine  20 Mg once daily. - Recommend Lifestyle Modifications to prevent Acid Reflux.  Rec. Avoid coffee, sodas, peppermint, garlic, onions, alcohol, citrus fruits, chocolate, tomatoes, fatty and spicey foods.  Avoid eating 2-3 hours before bedtime.    3.  Colon cancer screening - Negative Cologuard 01/2022. - Scheduling First Screening Colonoscopy I discussed risks of colonoscopy with patient to include risk of bleeding, colon perforation, and risk of sedation.  Patient expressed  understanding and  agrees to proceed with colonoscopy.   4.  Family History of Colon Polps (Her Mother had 13 polyps on last Colonoscopy).  Follow up based on colonoscopy results and GI symptoms.  Brigitte Canard, PA-C

## 2023-12-07 NOTE — Patient Instructions (Addendum)
 We have sent the following medications to your pharmacy for you to pick up at your convenience: Famotidine  20 mg once daily  You have been scheduled for a Colonoscopy. Please follow written instructions given to you at your visit today.   If you use inhalers (even only as needed), please bring them with you on the day of your procedure.  DO NOT TAKE 7 DAYS PRIOR TO TEST- Trulicity (dulaglutide) Ozempic, Wegovy (semaglutide) Mounjaro (tirzepatide) Bydureon Bcise (exanatide extended release)  DO NOT TAKE 1 DAY PRIOR TO YOUR TEST Rybelsus (semaglutide) Adlyxin (lixisenatide) Victoza (liraglutide) Byetta (exanatide) ___________________________________________________________________________  Please follow up sooner if symptoms increase or worsen   Due to recent changes in healthcare laws, you may see the results of your imaging and laboratory studies on MyChart before your provider has had a chance to review them.  We understand that in some cases there may be results that are confusing or concerning to you. Not all laboratory results come back in the same time frame and the provider may be waiting for multiple results in order to interpret others.  Please give us  48 hours in order for your provider to thoroughly review all the results before contacting the office for clarification of your results.   Thank you for trusting me with your gastrointestinal care!   Brigitte Canard, PA-C _______________________________________________________  If your blood pressure at your visit was 140/90 or greater, please contact your primary care physician to follow up on this.  _______________________________________________________  If you are age 75 or older, your body mass index should be between 23-30. Your Body mass index is 39.53 kg/m. If this is out of the aforementioned range listed, please consider follow up with your Primary Care Provider.  If you are age 69 or younger, your body mass index  should be between 19-25. Your Body mass index is 39.53 kg/m. If this is out of the aformentioned range listed, please consider follow up with your Primary Care Provider.   ________________________________________________________  The [facility] providers would like to encourage you to use MYCHART to communicate with providers for non-urgent requests or questions.  Due to long hold times on the telephone, sending your provider a message by [facility] may be a faster and more efficient way to get a response.  Please allow 48 business hours for a response.  Please remember that this is for non-urgent requests.  _______________________________________________________

## 2023-12-08 ENCOUNTER — Ambulatory Visit: Payer: Self-pay | Admitting: Family

## 2023-12-08 LAB — LIPID PANEL
Chol/HDL Ratio: 3.4 ratio (ref 0.0–4.4)
Cholesterol, Total: 131 mg/dL (ref 100–199)
HDL: 39 mg/dL — ABNORMAL LOW (ref >39)
LDL Chol Calc (NIH): 77 mg/dL (ref 0–99)
Triglycerides: 76 mg/dL (ref 0–149)
VLDL Cholesterol Cal: 15 mg/dL (ref 5–40)

## 2023-12-11 ENCOUNTER — Ambulatory Visit (HOSPITAL_COMMUNITY)
Admission: RE | Admit: 2023-12-11 | Discharge: 2023-12-11 | Disposition: A | Discharge status: Home / Self Care | Source: Ambulatory Visit | Attending: Cardiology | Admitting: Cardiology

## 2023-12-11 ENCOUNTER — Other Ambulatory Visit (HOSPITAL_COMMUNITY): Payer: Self-pay

## 2023-12-11 ENCOUNTER — Telehealth: Payer: Self-pay

## 2023-12-11 DIAGNOSIS — R0602 Shortness of breath: Secondary | ICD-10-CM | POA: Diagnosis not present

## 2023-12-11 DIAGNOSIS — R002 Palpitations: Secondary | ICD-10-CM | POA: Diagnosis not present

## 2023-12-11 DIAGNOSIS — R079 Chest pain, unspecified: Secondary | ICD-10-CM | POA: Diagnosis present

## 2023-12-11 LAB — ECHOCARDIOGRAM COMPLETE
Area-P 1/2: 6 cm2
S' Lateral: 2.5 cm

## 2023-12-11 MED ORDER — METOPROLOL TARTRATE 100 MG PO TABS
100.0000 mg | ORAL_TABLET | Freq: Once | ORAL | 0 refills | Status: DC
  Filled 2023-12-11: qty 1, 1d supply, fill #0

## 2023-12-11 MED ORDER — PERFLUTREN LIPID MICROSPHERE
1.0000 mL | INTRAVENOUS | Status: AC | PRN
  Administered 2023-12-11: 1 mL via INTRAVENOUS

## 2023-12-11 NOTE — Telephone Encounter (Signed)
 Coronary CTA ordered per Dr. Micael Adas. [facility] sent to patient with instructions.

## 2023-12-12 ENCOUNTER — Ambulatory Visit: Payer: Self-pay | Admitting: Cardiology

## 2023-12-13 ENCOUNTER — Telehealth: Payer: Self-pay

## 2023-12-13 DIAGNOSIS — Z79899 Other long term (current) drug therapy: Secondary | ICD-10-CM

## 2023-12-13 DIAGNOSIS — R0602 Shortness of breath: Secondary | ICD-10-CM

## 2023-12-13 NOTE — Telephone Encounter (Signed)
 Order placed for BNP, [facility] with instructions sent to patient.

## 2023-12-13 NOTE — Telephone Encounter (Addendum)
 Pt completed echo and heart monitor. Now penidng CT coronary which has not yet been scheduled.   I will reach out to Dr. Micael Adas to determine if she needs to complete this prior to clearance for colonoscopy.   Since she was seen in the last 60 days, will defer to Dr. Micael Adas for final clearance.

## 2023-12-14 ENCOUNTER — Other Ambulatory Visit: Payer: Self-pay

## 2023-12-14 DIAGNOSIS — R0602 Shortness of breath: Secondary | ICD-10-CM

## 2023-12-14 DIAGNOSIS — Z79899 Other long term (current) drug therapy: Secondary | ICD-10-CM

## 2023-12-14 NOTE — Progress Notes (Signed)
 BNP reordered

## 2023-12-16 LAB — PRO B NATRIURETIC PEPTIDE: NT-Pro BNP: <36 pg/mL (ref 0–249)

## 2023-12-17 ENCOUNTER — Ambulatory Visit: Payer: Self-pay | Admitting: Cardiology

## 2023-12-18 ENCOUNTER — Encounter (INDEPENDENT_AMBULATORY_CARE_PROVIDER_SITE_OTHER): Payer: Self-pay | Admitting: Cardiology

## 2023-12-18 DIAGNOSIS — G4733 Obstructive sleep apnea (adult) (pediatric): Secondary | ICD-10-CM | POA: Diagnosis not present

## 2023-12-18 NOTE — Progress Notes (Signed)
 Agree with the assessment and plan as outlined by Brigitte Canard, PA-C.

## 2023-12-19 ENCOUNTER — Ambulatory Visit: Attending: Cardiology

## 2023-12-19 DIAGNOSIS — G4719 Other hypersomnia: Secondary | ICD-10-CM

## 2023-12-19 NOTE — Procedures (Signed)
   SLEEP STUDY REPORT Patient Information Study Date: 12/19/2023 Patient Name: [name] Patient ID: [id number] Birth Date: [birth year] Age: 53 Gender: Female BMI: 40.2 (W=218 lb, H=5' 2'') Stopbang: 5 Referring Physician: Wilbert Bihari, MD  TEST DESCRIPTION: Home sleep apnea testing was completed using the WatchPat, a Type 1 device, utilizing  peripheral arterial tonometry (PAT), chest movement, actigraphy, pulse oximetry, pulse rate, body position and snore.  AHI was calculated with apnea and hypopnea using valid sleep time as the denominator. RDI includes apneas,  hypopneas, and RERAs. The data acquired and the scoring of sleep and all associated events were performed in  accordance with the recommended standards and specifications as outlined in the AASM Manual for the Scoring of  Sleep and Associated Events 2.2.0 (2015).   FINDINGS:   1. Mild Obstructive Sleep Apnea with AHI 8/hr. All events occurred in the supine position.  2. No Central Sleep Apnea with pAHIc 0.5/hr.   3. Oxygen desaturations as low as 86%.   4. Moderate snoring was present. O2 sats were < 88% for 0.3 min.   5. Total sleep time was 5 hrs and 45 min.   6. 26 % of total sleep time was spent in REM sleep.   7. Normal sleep onset latency at 10 min.   8. Prolonged REM sleep onset latency at 105 min.   9. Total awakenings were 8.  10. Arrhythmia detection: None  DIAGNOSIS: Mild Obstructive Sleep Apnea (G47.33)  RECOMMENDATIONS: 1. Clinical correlation of these findings is necessary. The decision to treat obstructive sleep apnea (OSA) is usually  based on the presence of apnea symptoms or the presence of associated medical conditions such as Hypertension,  Congestive Heart Failure, Atrial Fibrillation or Obesity. The most common symptoms of OSA are snoring, gasping for  breath while sleeping, daytime sleepiness and fatigue.  2. Initiating apnea therapy is recommended given the presence of symptoms and/or  associated conditions.  Recommend proceeding with one of the following:  a. Auto-CPAP therapy with a pressure range of 5-20cm H2O.  b. An oral appliance (OA) that can be obtained from certain dentists with expertise in sleep medicine. These are  primarily of use in non-obese patients with mild and moderate disease.  c. An ENT consultation which may be useful to look for specific causes of obstruction and possible treatment  options.  d. If patient is intolerant to PAP therapy, consider referral to ENT for evaluation for hypoglossal nerve stimulator.  3. Close follow-up is necessary to ensure success with CPAP or oral appliance therapy for maximum benefit . 4. A follow-up oximetry study on CPAP is recommended to assess the adequacy of therapy and determine the need  for supplemental oxygen or the potential need for Bi-level therapy. An arterial blood gas to determine the adequacy of  baseline ventilation and oxygenation should also be considered. 5. Healthy sleep recommendations include: adequate nightly sleep (normal 7-9 hrs/night), avoidance of caffeine after  noon and alcohol near bedtime, and maintaining a sleep environment that is cool, dark and quiet. 6. Weight loss for overweight patients is recommended. Even modest amounts of weight loss can significantly  improve the severity of sleep apnea. 7. Snoring recommendations include: weight loss where appropriate, side sleeping, and avoidance of alcohol before  bed. 8. Operation of motor vehicle should be avoided when sleepy.  Signature: Wilbert Bihari, MD; [facility]; Diplomat, American Board of Sleep  Medicine Electronically Signed: 12/19/2023 3:55:57 PM

## 2023-12-21 ENCOUNTER — Ambulatory Visit: Admitting: Cardiology

## 2023-12-21 ENCOUNTER — Telehealth: Payer: Self-pay

## 2023-12-21 ENCOUNTER — Other Ambulatory Visit (HOSPITAL_COMMUNITY): Payer: Self-pay

## 2023-12-21 ENCOUNTER — Encounter (HOSPITAL_COMMUNITY): Payer: Self-pay

## 2023-12-21 NOTE — Telephone Encounter (Signed)
-----   Message from Wilbert Bihari sent at 12/19/2023  3:57 PM EDT ----- Very mild OSA that occurs when sleeping supine.  Please have her come in to discuss treatment options

## 2023-12-21 NOTE — Telephone Encounter (Signed)
 Notified patient of sleep study results and recommendations. All questions were answered and patient verbalized understanding. LN will get patient scheduled to discuss treatment options.

## 2023-12-25 ENCOUNTER — Ambulatory Visit (HOSPITAL_COMMUNITY)
Admission: RE | Admit: 2023-12-25 | Discharge: 2023-12-25 | Disposition: A | Discharge status: Home / Self Care | Source: Ambulatory Visit | Attending: Cardiovascular Disease | Admitting: Cardiovascular Disease

## 2023-12-25 DIAGNOSIS — R079 Chest pain, unspecified: Secondary | ICD-10-CM | POA: Diagnosis present

## 2023-12-25 DIAGNOSIS — R002 Palpitations: Secondary | ICD-10-CM | POA: Diagnosis not present

## 2023-12-25 DIAGNOSIS — R0602 Shortness of breath: Secondary | ICD-10-CM | POA: Insufficient documentation

## 2023-12-25 DIAGNOSIS — I2510 Atherosclerotic heart disease of native coronary artery without angina pectoris: Secondary | ICD-10-CM | POA: Insufficient documentation

## 2023-12-25 DIAGNOSIS — I517 Cardiomegaly: Secondary | ICD-10-CM | POA: Diagnosis not present

## 2023-12-25 MED ORDER — IOHEXOL 350 MG/ML SOLN
100.0000 mL | Freq: Once | INTRAVENOUS | Status: AC | PRN
  Administered 2023-12-25: 100 mL via INTRAVENOUS

## 2023-12-25 MED ORDER — NITROGLYCERIN 0.4 MG SL SUBL
0.8000 mg | SUBLINGUAL_TABLET | Freq: Once | SUBLINGUAL | Status: AC
  Administered 2023-12-25: 0.8 mg via SUBLINGUAL

## 2023-12-27 ENCOUNTER — Ambulatory Visit: Payer: Self-pay

## 2023-12-27 ENCOUNTER — Other Ambulatory Visit: Payer: Self-pay

## 2023-12-27 DIAGNOSIS — Z79899 Other long term (current) drug therapy: Secondary | ICD-10-CM

## 2023-12-27 DIAGNOSIS — I2510 Atherosclerotic heart disease of native coronary artery without angina pectoris: Secondary | ICD-10-CM

## 2023-12-27 NOTE — Telephone Encounter (Signed)
   Patient Name: [name]  DOB: 09-Apr-1971 MRN: [id number]  Primary Cardiologist: Dr. Shlomo  Chart reviewed as part of pre-operative protocol coverage. Given past medical history and time since last visit, based on ACC/AHA guidelines, [name] is at acceptable risk for the planned procedure without further cardiovascular testing. Echocardiogram on 12/11/2023 and Coronary CT on 12/25/2023 were normal.   The patient was advised that if she develops new symptoms prior to surgery to contact our office to arrange for a follow-up visit, and she verbalized understanding.  I will route this recommendation to the requesting party via Epic fax function and remove from pre-op pool.  Please call with questions.  Lamarr Satterfield, NP 12/27/2023, 8:30 AM

## 2024-01-12 ENCOUNTER — Ambulatory Visit: Admitting: Nurse Practitioner

## 2024-01-15 LAB — LIPID PANEL
Chol/HDL Ratio: 3.1 ratio (ref 0.0–4.4)
Cholesterol, Total: 138 mg/dL (ref 100–199)
HDL: 44 mg/dL (ref >39)
LDL Chol Calc (NIH): 76 mg/dL (ref 0–99)
Triglycerides: 95 mg/dL (ref 0–149)
VLDL Cholesterol Cal: 18 mg/dL (ref 5–40)

## 2024-01-15 LAB — ALT: ALT: 14 IU/L (ref 0–32)

## 2024-01-15 NOTE — Progress Notes (Unsigned)
 Cardiology Office Note:    Date:  01/17/2024   ID:  [name], DOB 1970/08/18, MRN [id number]  PCP:  Lorren Greig [name], NP   [facility] Providers Cardiologist:  Wilbert Bihari, MD Cardiology APP:  Madie Jon Garre, PA     Referring MD: Lorren Greig [name], NP   Chief Complaint  Patient presents with   Follow-up    Paroxysmal SVT    History of Present Illness:    [name] is a 53 y.o. female with a hx of DM and paroxysmal SVT.  She wore a heart monitor 11/2023 that showed paroxysmal SVT and she was subsequently started on beta-blocker.  Echocardiogram 11/2023 showed LVEF 60 to 65%, grade 1 DD, normal RV, and no significant valvular disease.  Of note there were an indeterminate number of cusps on the aortic valve.  CT coronary 12/25/2023 with minimal noncalcified nonobstructive CAD, coronary calcium score 0.  She presents back for follow up of studies and after starting toprol . Still having intermittent heart racing for 30-60 seconds. She thinks this is improved on toprol . She is changing her diet and started exercising - has returned to PT. She is walking and doing stair stepper three times daily. 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    Past Medical History:  Diagnosis Date   Anemia    Anxiety    Diabetes mellitus    Type 2   Fibroid    HLD (hyperlipidemia)    Hypertension    Infection    UTI   Obesity    Panic attack    Vaginal delivery [phone]   Vaginal Pap smear, abnormal     Past Surgical History:  Procedure Laterality Date   KNEE SURGERY Bilateral    x 2 left, 1 right   ROBOTIC ASSISTED TOTAL HYSTERECTOMY WITH SALPINGECTOMY Bilateral 12/22/2015   Procedure: ROBOTIC ASSISTED TOTAL HYSTERECTOMY WITH BILATERAL SALPINGECTOMY;  Surgeon: Elveria Mungo, MD;  Location: [facility];  Service: Gynecology;  Laterality: Bilateral;    Current Medications: Current Meds  Medication Sig   amLODipine  (NORVASC ) 5 MG tablet Take 1 tablet (5 mg  total) by mouth daily.   famotidine  (PEPCID ) 20 MG tablet Take 1 tablet (20 mg total) by mouth 2 (two) times daily.   hydrOXYzine  (VISTARIL ) 25 MG capsule Take 1 capsule (25 mg total) by mouth every 8 (eight) hours as needed.   metFORMIN  (GLUCOPHAGE ) 1000 MG tablet metformin  1,000 mg tablet  TAKE 1 TABLET TWICE A DAY BY ORAL ROUTE FOR 90 DAYS.   rosuvastatin (CRESTOR) 5 MG tablet Take 5 mg by mouth daily.   tirzepatide  (MOUNJARO ) 10 MG/0.5ML Pen Inject 10 mg into the skin once a week.   [DISCONTINUED] metoprolol  succinate (TOPROL  XL) 25 MG 24 hr tablet Take 1 tablet (25 mg total) by mouth daily.     Allergies:   Aloe, Oxycodone-acetaminophen, Bactrim [sulfamethoxazole -trimethoprim], and Percocet [oxycodone-acetaminophen]   Social History   Socioeconomic History   Marital status: Married    Spouse name: Not on file   Number of children: 3   Years of education: Not on file   Highest education level: Not on file  Occupational History   Not on file  Tobacco Use   Smoking status: Never   Smokeless tobacco: Never  Vaping Use   Vaping status: Never Used  Substance and Sexual Activity   Alcohol use: No   Drug use: No   Sexual activity: Yes    Birth control/protection: Post-menopausal  Other Topics Concern  Not on file  Social History Narrative   Not on file   Social Drivers of Health   Financial Resource Strain: Low Risk  (11/01/2023)   Overall Financial Resource Strain (CARDIA)    Difficulty of Paying Living Expenses: Not hard at all  Food Insecurity: No Food Insecurity (11/01/2023)   Hunger Vital Sign    Worried About Running Out of Food in the Last Year: Never true    Ran Out of Food in the Last Year: Never true  Transportation Needs: No Transportation Needs (11/01/2023)   PRAPARE - Administrator, Civil Service (Medical): No    Lack of Transportation (Non-Medical): No  Physical Activity: Inactive (11/01/2023)   Exercise Vital Sign    Days of Exercise per Week: 0  days    Minutes of Exercise per Session: 0 min  Stress: No Stress Concern Present (11/01/2023)   [employer] of Occupational Health - Occupational Stress Questionnaire    Feeling of Stress : Not at all  Social Connections: Moderately Isolated (11/01/2023)   Social Connection and Isolation Panel    Frequency of Communication with Friends and Family: Twice a week    Frequency of Social Gatherings with Friends and Family: Twice a week    Attends Religious Services: 1 to 4 times per year    Active Member of [employer] or Organizations: No    Attends Engineer, structural: Never    Marital Status: Never married     Family History: The patient's family history includes Autism in her daughter; Breast cancer in her maternal aunt; Clotting disorder in her brother; Colon cancer in her maternal aunt; Colon polyps in her mother; Diabetes in her brother, brother, and father; Hypertension in her brother, brother, daughter, father, maternal grandfather, maternal grandmother, mother, paternal grandfather, paternal grandmother, and son.  ROS:   Please see the history of present illness.     All other systems reviewed and are negative.  EKGs/Labs/Other Studies Reviewed:    The following studies were reviewed today:       Recent Labs: 10/21/2023: B Natriuretic Peptide 23.2 12/04/2023: BUN 11; Creatinine, Ser 0.59; Hemoglobin 14.3; Platelets 257; Potassium 4.1; Sodium 142; TSH 0.931 12/15/2023: NT-Pro BNP <36 01/15/2024: ALT 14  Recent Lipid Panel    Component Value Date/Time   CHOL 138 01/15/2024 1027   TRIG 95 01/15/2024 1027   HDL 44 01/15/2024 1027   CHOLHDL 3.1 01/15/2024 1027   CHOLHDL 5.0 02/24/2012 1013   VLDL 19 02/24/2012 1013   LDLCALC 76 01/15/2024 1027     Risk Assessment/Calculations:           STOP-Bang Score:  5       Physical Exam:    VS:  BP 132/88 (BP Location: Left Arm, Patient Position: Sitting)   Pulse (!) 105   Ht 5' 2 (1.575 m)   Wt 216 lb 9.6 oz (98.2  kg)   LMP 10/02/2015   SpO2 97%   BMI 39.62 kg/m     Wt Readings from Last 3 Encounters:  01/17/24 216 lb 9.6 oz (98.2 kg)  12/07/23 216 lb 2 oz (98 kg)  12/04/23 216 lb 6.4 oz (98.2 kg)     GEN:  Well nourished, well developed in no acute distress HEENT: Normal NECK: No JVD; No carotid bruits LYMPHATICS: No lymphadenopathy CARDIAC: RRR, no murmurs, rubs, gallops RESPIRATORY:  Clear to auscultation without rales, wheezing or rhonchi  ABDOMEN: Soft, non-tender, non-distended MUSCULOSKELETAL:  No edema; No deformity  SKIN: Warm and dry NEUROLOGIC:  Alert and oriented x 3 PSYCHIATRIC:  Normal affect   ASSESSMENT:    1. Paroxysmal SVT (supraventricular tachycardia) (HCC)   2. Primary hypertension   3. Hyperlipidemia with target LDL less than 100    PLAN:    In order of problems listed above:  Paroxymal SVT - 25 mg toprol  - will move this to nighttime dosing - no change in dose   Hypertension  - stopped amlodipine  when starting toprol  - BP creeping back up, will restart 5 mg amlodipine    Hyperlipidemia with LDL goal < 100 01/15/2024: Cholesterol, Total 138; HDL 44; LDL Chol Calc (NIH) 76; Triglycerides 95 Continue 5 mg crestor     Follow up in 1 year with me or Dr. Shlomo.       Medication Adjustments/Labs and Tests Ordered: Current medicines are reviewed at length with the patient today.  Concerns regarding medicines are outlined above.  No orders of the defined types were placed in this encounter.  Meds ordered this encounter  Medications   metoprolol  succinate (TOPROL  XL) 25 MG 24 hr tablet    Sig: Take 1 tablet (25 mg total) by mouth at bedtime.    Dispense:  90 tablet    Refill:  3   amLODipine  (NORVASC ) 5 MG tablet    Sig: Take 1 tablet (5 mg total) by mouth daily.    Dispense:  180 tablet    Refill:  3    Patient Instructions  Medication Instructions:  Take the Metoprolol  Tartrate 25mg  in the evening at bedtime.  Begin the Amlodipine  5mg .  Take one tablet daily.  *If you need a refill on your cardiac medications before your next appointment, please call your pharmacy*   Lab Work: No labs were ordered during today's visit.  If you have labs (blood work) drawn today and your tests are completely normal, you will receive your results only by: MyChart Message (if you have MyChart) OR A paper copy in the mail If you have any lab test that is abnormal or we need to change your treatment, we will call you to review the results.   Testing/Procedures: No procedures were ordered during today's visit.    Follow-Up: At [facility], you and your health needs are our priority.  As part of our continuing mission to provide you with exceptional heart care, we have created designated Provider Care Teams.  These Care Teams include your primary Cardiologist (physician) and Advanced Practice Providers (APPs -  Physician Assistants and Nurse Practitioners) who all work together to provide you with the care you need, when you need it.  We recommend signing up for the patient portal called MyChart.  Sign up information is provided on this After Visit Summary.  MyChart is used to connect with patients for Virtual Visits (Telemedicine).  Patients are able to view lab/test results, encounter notes, upcoming appointments, etc.  Non-urgent messages can be sent to your provider as well.   To learn more about what you can do with MyChart, go to [email or web address].    Your next appointment:   1 year(s)  Provider:   Wilbert Shlomo, MD or Mercy Hails, PA    Other Instructions A letter will be mailed to you as a reminder to call the office for your next follow up appointment.     Signed, Jon Nat Hails, [place]  01/17/2024 11:16 AM    [facility]

## 2024-01-16 ENCOUNTER — Ambulatory Visit: Payer: Self-pay | Admitting: Cardiology

## 2024-01-17 ENCOUNTER — Encounter: Payer: Self-pay | Admitting: Physician Assistant

## 2024-01-17 ENCOUNTER — Ambulatory Visit: Attending: Cardiovascular Disease | Admitting: Physician Assistant

## 2024-01-17 VITALS — BP 132/88 | HR 105 | Ht 62.0 in | Wt 216.6 lb

## 2024-01-17 DIAGNOSIS — E785 Hyperlipidemia, unspecified: Secondary | ICD-10-CM | POA: Diagnosis not present

## 2024-01-17 DIAGNOSIS — I471 Supraventricular tachycardia, unspecified: Secondary | ICD-10-CM | POA: Diagnosis not present

## 2024-01-17 DIAGNOSIS — I10 Essential (primary) hypertension: Secondary | ICD-10-CM

## 2024-01-17 MED ORDER — AMLODIPINE BESYLATE 5 MG PO TABS: 5.0000 mg | ORAL_TABLET | Freq: Every day | ORAL | 3 refills | Status: DC

## 2024-01-17 MED ORDER — METOPROLOL SUCCINATE ER 25 MG PO TB24
25.0000 mg | ORAL_TABLET | Freq: Every day | ORAL | 3 refills | Status: AC
Start: 2024-01-17 — End: ?

## 2024-01-17 NOTE — Patient Instructions (Signed)
 Medication Instructions:  Take the Metoprolol  Tartrate 25mg  in the evening at bedtime.  Begin the Amlodipine  5mg . Take one tablet daily.  *If you need a refill on your cardiac medications before your next appointment, please call your pharmacy*   Lab Work: No labs were ordered during today's visit.  If you have labs (blood work) drawn today and your tests are completely normal, you will receive your results only by: MyChart Message (if you have MyChart) OR A paper copy in the mail If you have any lab test that is abnormal or we need to change your treatment, we will call you to review the results.   Testing/Procedures: No procedures were ordered during today's visit.    Follow-Up: At [facility], you and your health needs are our priority.  As part of our continuing mission to provide you with exceptional heart care, we have created designated Provider Care Teams.  These Care Teams include your primary Cardiologist (physician) and Advanced Practice Providers (APPs -  Physician Assistants and Nurse Practitioners) who all work together to provide you with the care you need, when you need it.  We recommend signing up for the patient portal called MyChart.  Sign up information is provided on this After Visit Summary.  MyChart is used to connect with patients for Virtual Visits (Telemedicine).  Patients are able to view lab/test results, encounter notes, upcoming appointments, etc.  Non-urgent messages can be sent to your provider as well.   To learn more about what you can do with MyChart, go to [email or web address].    Your next appointment:   1 year(s)  Provider:   Wilbert Bihari, MD or Mercy Hails, PA    Other Instructions A letter will be mailed to you as a reminder to call the office for your next follow up appointment.

## 2024-01-18 ENCOUNTER — Telehealth: Payer: Self-pay

## 2024-01-18 NOTE — Telephone Encounter (Signed)
 Copied from CRM [id number]. Topic: Clinical - Medication Question >> Jan 18, 2024  3:59 PM Charlet HERO wrote: Reason for CRM: Patient is calling to get asst with samples of amLODipine  (NORVASC ) 5 MG tablet she was informed by provider to no longer take the meds and she threw them away, so she was not able to get a refill by pharmacy bc she is not due to 08/12 the patient was told to see if pcp has samples. Please call the patient to let her know if there are or aren't.

## 2024-01-19 NOTE — Telephone Encounter (Signed)
 [facility] does not have samples of Amlodipine . Please consult with established [facility] at [facility] [employer] [facility] (last office visit 01/17/2024).

## 2024-01-19 NOTE — Telephone Encounter (Signed)
 I called patient and made her aware of PCP recommendations

## 2024-01-22 ENCOUNTER — Telehealth: Payer: Self-pay

## 2024-01-22 ENCOUNTER — Other Ambulatory Visit (HOSPITAL_COMMUNITY): Payer: Self-pay

## 2024-01-22 MED ORDER — AMLODIPINE BESYLATE 5 MG PO TABS
5.0000 mg | ORAL_TABLET | Freq: Every day | ORAL | 3 refills | Status: AC
  Filled 2024-01-22 – 2024-01-23 (×2): qty 30, 30d supply, fill #0

## 2024-01-22 NOTE — Telephone Encounter (Signed)
 30 days of amlodipine  [facility] [address].

## 2024-01-23 ENCOUNTER — Other Ambulatory Visit (HOSPITAL_COMMUNITY): Payer: Self-pay

## 2024-01-26 ENCOUNTER — Ambulatory Visit: Attending: Cardiology | Admitting: Cardiology

## 2024-01-26 VITALS — BP 137/84 | HR 98 | Ht 62.0 in | Wt 216.0 lb

## 2024-01-26 DIAGNOSIS — E785 Hyperlipidemia, unspecified: Secondary | ICD-10-CM

## 2024-01-26 DIAGNOSIS — I2510 Atherosclerotic heart disease of native coronary artery without angina pectoris: Secondary | ICD-10-CM

## 2024-01-26 DIAGNOSIS — I10 Essential (primary) hypertension: Secondary | ICD-10-CM

## 2024-01-26 DIAGNOSIS — I471 Supraventricular tachycardia, unspecified: Secondary | ICD-10-CM

## 2024-01-26 DIAGNOSIS — G4733 Obstructive sleep apnea (adult) (pediatric): Secondary | ICD-10-CM

## 2024-01-26 MED ORDER — ROSUVASTATIN CALCIUM 10 MG PO TABS: 10.0000 mg | ORAL_TABLET | Freq: Every day | ORAL | 3 refills | Status: AC

## 2024-01-26 NOTE — Addendum Note (Signed)
 Addended by: SHLOMO WILBERT SAUNDERS on: 01/26/2024 08:57 AM   Modules accepted: Level of Service

## 2024-01-26 NOTE — Addendum Note (Signed)
 Addended by: CHAUVIGNE, Jonavin Seder on: 01/26/2024 08:47 AM   Modules accepted: Orders

## 2024-01-26 NOTE — Progress Notes (Signed)
 SLEEP MEDICINE VIRTUAL NOTE via Video Note   Because of [name]'s co-morbid illnesses, she is at least at moderate risk for complications without adequate follow up.  This format is felt to be most appropriate for this patient at this time.  All issues noted in this document were discussed and addressed.  A limited physical exam was performed with this format.  Please refer to the patient's chart for her consent to telehealth for [facility].      Date:  01/26/2024   ID:  [name], DOB [birth year], MRN [id number] The patient was identified using 2 identifiers.  Patient Location: Home Provider Location: Home Office   PCP:  Lorren Greig [name], NP   [facility] Providers Cardiologist:  Wilbert Bihari, MD Cardiology APP:  Madie Jon Garre, [place]     Evaluation Performed: Follow-up  Chief Complaint: Obstructive sleep apnea  History of Present Illness:    [name] is a 53 y.o. female with  history of diabetes mellitus 2 and hypertension.  I initially saw her 11/06/2023 for complaints of chest pain shortness of breath and palpitations.  At that office visit she complained of excessive daytime sleepiness with a STOP-BANG score of 5 and so a sleep study was ordered.  She underwent home sleep study showing mild obstructive sleep apnea with an AHI of 8/h and moderate snoring.  She is now back to discuss results of her sleep study and further treatment.  Of note a 2D echo was done 12/11/2023 for workup of her chest pain and shortness of breath which showed EF 60 to 65% with G1 DD, normal RV and no valvular heart disease.  She underwent coronary CTA which demonstrated a coronary calcium score of 0 no obvious CAD although a minimal total plaque volume of 9 which was noncalcified plaque.  She also wore a heart monitor for palpitations which showed normal sinus rhythm with 7 episodes of SVT lasting as long as 13 beats and rare PVCs.  Past Medical History:   Diagnosis Date   Anemia    Anxiety    Diabetes mellitus    Type 2   Fibroid    HLD (hyperlipidemia)    Hypertension    Infection    UTI   Obesity    Panic attack    Vaginal delivery [phone]   Vaginal Pap smear, abnormal    Past Surgical History:  Procedure Laterality Date   KNEE SURGERY Bilateral    x 2 left, 1 right   ROBOTIC ASSISTED TOTAL HYSTERECTOMY WITH SALPINGECTOMY Bilateral 12/22/2015   Procedure: ROBOTIC ASSISTED TOTAL HYSTERECTOMY WITH BILATERAL SALPINGECTOMY;  Surgeon: Elveria Mungo, MD;  Location: [facility];  Service: Gynecology;  Laterality: Bilateral;     Current Meds  Medication Sig   amLODipine  (NORVASC ) 5 MG tablet Take 1 tablet (5 mg total) by mouth daily.   escitalopram  (LEXAPRO ) 5 MG tablet Take 1 tablet (5 mg total) by mouth daily.   famotidine  (PEPCID ) 20 MG tablet Take 1 tablet (20 mg total) by mouth 2 (two) times daily.   hydrOXYzine  (VISTARIL ) 25 MG capsule Take 1 capsule (25 mg total) by mouth every 8 (eight) hours as needed.   metFORMIN  (GLUCOPHAGE ) 1000 MG tablet metformin  1,000 mg tablet  TAKE 1 TABLET TWICE A DAY BY ORAL ROUTE FOR 90 DAYS.   metoprolol  succinate (TOPROL  XL) 25 MG 24 hr tablet Take 1 tablet (25 mg total) by mouth at bedtime.   rosuvastatin (  CRESTOR) 5 MG tablet Take 5 mg by mouth daily.   tirzepatide  (MOUNJARO ) 10 MG/0.5ML Pen Inject 10 mg into the skin once a week.     Allergies:   Aloe, Oxycodone-acetaminophen, Bactrim [sulfamethoxazole -trimethoprim], and Percocet [oxycodone-acetaminophen]   Social History   Tobacco Use   Smoking status: Never   Smokeless tobacco: Never  Vaping Use   Vaping status: Never Used  Substance Use Topics   Alcohol use: No   Drug use: No     Family Hx: The patient's family history includes Autism in her daughter; Breast cancer in her maternal aunt; Clotting disorder in her brother; Colon cancer in her maternal aunt; Colon polyps in her mother; Diabetes in her brother, brother,  and father; Hypertension in her brother, brother, daughter, father, maternal grandfather, maternal grandmother, mother, paternal grandfather, paternal grandmother, and son.  ROS:   Please see the history of present illness.     All other systems reviewed and are negative.   Prior Sleep studies:   The following studies were reviewed today:  Coronary CTA, 2D echo, heart monitor  Labs/Other Tests and Data Reviewed:     Recent Labs: 10/21/2023: B Natriuretic Peptide 23.2 12/04/2023: BUN 11; Creatinine, Ser 0.59; Hemoglobin 14.3; Platelets 257; Potassium 4.1; Sodium 142; TSH 0.931 12/15/2023: NT-Pro BNP <36 01/15/2024: ALT 14    Wt Readings from Last 3 Encounters:  01/26/24 216 lb (98 kg)  01/17/24 216 lb 9.6 oz (98.2 kg)  12/07/23 216 lb 2 oz (98 kg)     Risk Assessment/Calculations:      STOP-Bang Score:  5      Objective:    Vital Signs:  BP 137/84   Pulse 98   Ht 5' 2 (1.575 m)   Wt 216 lb (98 kg)   LMP 10/02/2015   SpO2 96%   BMI 39.51 kg/m    VITAL SIGNS:  reviewed GEN:  no acute distress EYES:  sclerae anicteric, EOMI - Extraocular Movements Intact RESPIRATORY:  normal respiratory effort, symmetric expansion CARDIOVASCULAR:  no peripheral edema SKIN:  no rash, lesions or ulcers. MUSCULOSKELETAL:  no obvious deformities. NEURO:  alert and oriented x 3, no obvious focal deficit PSYCH:  normal affect  ASSESSMENT & PLAN:    #ASCAD -coronary CTA which demonstrated a coronary calcium score of 0 no obvious CAD although a minimal total plaque volume of 9 which was noncalcified plaque. - 2D echo showed normal LV function with G1 DD and normal valves - She has not had any further chest pain or shortness of breath - She will continue on Toprol -XL 25 mg daily - Given her diabetes as well as minimal plaque I would and going to strive for an LDL less than 70 so I will increase her Crestor to 10 mg daily  #HTN - BP controlled on exam - Continue amlodipine  5 mg daily,  Toprol -XL 25 mg daily with as needed refills  #HLD - Given her minimal plaque and diabetes would recommend an LDL goal less than 70 -I have personally reviewed and interpreted outside labs performed by patient's PCP which showed LDL 76, HDL 44 and ALT 14 on 01/15/2024 - Increase Crestor to 10 mg daily - Repeat FLP and ALT in 6 weeks  #PSVT - At last office visit complained of palpitations - Event monitor showed several episodes of SVT lasting as long as 13 seconds with occasional PVCs - Since I saw her her palpitations have significantly improved and are not even bothersome at this point - Since  she is fairly asymptomatic we will not recommend treatment at this time and continue on her Toprol -XL 25 mg daily - Encouraged her to cut back on her caffeine use  #OSA -Sleep study showed very mild obstructive sleep apnea -She has no excessive daytime sleepiness and therefore we have chose not to treat her sleep apnea - Her events occurred mainly only in supine sleep and so I have encouraged her to avoid sleeping supine   Time:   Today, I have spent 20 minutes with the patient with telehealth technology discussing the above problems.     Medication Adjustments/Labs and Tests Ordered: Current medicines are reviewed at length with the patient today.  Concerns regarding medicines are outlined above.   Tests Ordered: No orders of the defined types were placed in this encounter.   Medication Changes: No orders of the defined types were placed in this encounter.   Follow Up:  In Person in 1 year(s)  Signed, Wilbert Bihari, MD  01/26/2024 8:37 AM    [facility]

## 2024-01-26 NOTE — Patient Instructions (Signed)
 Medication Instructions:  Your physician has recommended you make the following change in your medication:  1) INCREASE Crestor (rosuvastatin) to 10 mg daily   *If you need a refill on your cardiac medications before your next appointment, please call your pharmacy*  Lab Work: IN 6-8 WEEKS: Fasting lipids and ALT - please have these drawn at any [facility] location  Follow-Up: At [facility], you and your health needs are our priority.  As part of our continuing mission to provide you with exceptional heart care, our providers are all part of one team.  This team includes your primary Cardiologist (physician) and Advanced Practice Providers or APPs (Physician Assistants and Nurse Practitioners) who all work together to provide you with the care you need, when you need it.  Your next appointment:   1 year  Provider:   Wilbert Bihari, MD

## 2024-01-30 ENCOUNTER — Encounter: Payer: Self-pay | Admitting: Gastroenterology

## 2024-01-30 ENCOUNTER — Ambulatory Visit (AMBULATORY_SURGERY_CENTER): Admitting: Gastroenterology

## 2024-01-30 VITALS — BP 108/69 | HR 95 | Temp 97.5°F | Resp 15 | Ht 62.0 in | Wt 216.0 lb

## 2024-01-30 DIAGNOSIS — K644 Residual hemorrhoidal skin tags: Secondary | ICD-10-CM | POA: Diagnosis not present

## 2024-01-30 DIAGNOSIS — Z800 Family history of malignant neoplasm of digestive organs: Secondary | ICD-10-CM | POA: Diagnosis not present

## 2024-01-30 DIAGNOSIS — K635 Polyp of colon: Secondary | ICD-10-CM | POA: Diagnosis not present

## 2024-01-30 DIAGNOSIS — D125 Benign neoplasm of sigmoid colon: Secondary | ICD-10-CM

## 2024-01-30 DIAGNOSIS — D123 Benign neoplasm of transverse colon: Secondary | ICD-10-CM

## 2024-01-30 DIAGNOSIS — Z83719 Family history of colon polyps, unspecified: Secondary | ICD-10-CM

## 2024-01-30 DIAGNOSIS — Z1211 Encounter for screening for malignant neoplasm of colon: Secondary | ICD-10-CM | POA: Diagnosis present

## 2024-01-30 DIAGNOSIS — D120 Benign neoplasm of cecum: Secondary | ICD-10-CM | POA: Diagnosis not present

## 2024-01-30 MED ORDER — SODIUM CHLORIDE 0.9 % IV SOLN: 500.0000 mL | Freq: Once | INTRAVENOUS | Status: DC

## 2024-01-30 NOTE — Patient Instructions (Signed)
Handout on polyps provided.  Await pathology results.  YOU HAD AN ENDOSCOPIC PROCEDURE TODAY AT [facility]:   Refer to the procedure report that was given to you for any specific questions about what was found during the examination.  If the procedure report does not answer your questions, please call your gastroenterologist to clarify.  If you requested that your care partner not be given the details of your procedure findings, then the procedure report has been included in a sealed envelope for you to review at your convenience later.  YOU SHOULD EXPECT: Some feelings of bloating in the abdomen. Passage of more gas than usual.  Walking can help get rid of the air that was put into your GI tract during the procedure and reduce the bloating. If you had a lower endoscopy (such as a colonoscopy or flexible sigmoidoscopy) you may notice spotting of blood in your stool or on the toilet paper. If you underwent a bowel prep for your procedure, you may not have a normal bowel movement for a few days.  Please Note:  You might notice some irritation and congestion in your nose or some drainage.  This is from the oxygen used during your procedure.  There is no need for concern and it should clear up in a day or so.  SYMPTOMS TO REPORT IMMEDIATELY:  Following lower endoscopy (colonoscopy or flexible sigmoidoscopy):  Excessive amounts of blood in the stool  Significant tenderness or worsening of abdominal pains  Swelling of the abdomen that is new, acute  Fever of 100F or higher   For urgent or emergent issues, a gastroenterologist can be reached at any hour by calling [phone]. Do not use MyChart messaging for urgent concerns.    DIET:  We do recommend a small meal at first, but then you may proceed to your regular diet.  Drink plenty of fluids but you should avoid alcoholic beverages for 24 hours.  ACTIVITY:  You should plan to take it easy for the rest of today and you should  NOT DRIVE or use heavy machinery until tomorrow (because of the sedation medicines used during the test).    FOLLOW UP: Our staff will call the number listed on your records the next business day following your procedure.  We will call around 7:15- 8:00 am to check on you and address any questions or concerns that you may have regarding the information given to you following your procedure. If we do not reach you, we will leave a message.     If any biopsies were taken you will be contacted by phone or by letter within the next 1-3 weeks.  Please call us at [phone] if you have not heard about the biopsies in 3 weeks.    SIGNATURES/CONFIDENTIALITY: You and/or your care partner have signed paperwork which will be entered into your electronic medical record.  These signatures attest to the fact that that the information above on your After Visit Summary has been reviewed and is understood.  Full responsibility of the confidentiality of this discharge information lies with you and/or your care-partner.

## 2024-01-30 NOTE — Progress Notes (Signed)
 Sedate, gd SR, tolerated procedure well, VSS, report to RN

## 2024-01-30 NOTE — Progress Notes (Signed)
 Called to room to assist during endoscopic procedure.  Patient ID and intended procedure confirmed with present staff. Received instructions for my participation in the procedure from the performing physician.

## 2024-01-30 NOTE — Progress Notes (Signed)
 Pt's states no medical or surgical changes since previsit or office visit.

## 2024-01-30 NOTE — Op Note (Signed)
 [facility] Patient Name: [name] Procedure Date: 01/30/2024 8:32 AM MRN: [id number] Endoscopist: Glendia E. Stacia , MD, [id number] Age: 53 Referring MD:  Date of Birth: Sep 02, 1970 Gender: Female Account #: [id number] Procedure:                Colonoscopy Indications:              Screening for colorectal malignant neoplasm Medicines:                Monitored Anesthesia Care Procedure:                Pre-Anesthesia Assessment:                           - Prior to the procedure, a History and Physical                            was performed, and patient medications and                            allergies were reviewed. The patient's tolerance of                            previous anesthesia was also reviewed. The risks                            and benefits of the procedure and the sedation                            options and risks were discussed with the patient.                            All questions were answered, and informed consent                            was obtained. Prior Anticoagulants: The patient has                            taken no anticoagulant or antiplatelet agents. ASA                            Grade Assessment: II - A patient with mild systemic                            disease. After reviewing the risks and benefits,                            the patient was deemed in satisfactory condition to                            undergo the procedure.                           After obtaining informed consent, the colonoscope  was passed under direct vision. Throughout the                            procedure, the patient's blood pressure, pulse, and                            oxygen saturations were monitored continuously. The                            CF [id number] #[id number] was introduced through the anus                            and advanced to the the cecum, identified by                             appendiceal orifice and ileocecal valve. The                            colonoscopy was performed without difficulty. The                            patient tolerated the procedure well. The quality                            of the bowel preparation was good. The ileocecal                            valve, appendiceal orifice, and rectum were                            photographed. The bowel preparation used was SUTAB                             via split dose instruction. Scope In: 8:49:08 AM Scope Out: 9:08:04 AM Scope Withdrawal Time: 0 hours 15 minutes 59 seconds  Total Procedure Duration: 0 hours 18 minutes 56 seconds  Findings:                 Skin tags were found on perianal exam.                           The digital rectal exam was normal. Pertinent                            negatives include normal sphincter tone.                           Two sessile polyps were found in the cecum. The                            polyps were 5 to 6 mm in size. These polyps were                            removed with a cold snare.  Resection and retrieval                            were complete. Estimated blood loss was minimal.                           Two sessile polyps were found in the transverse                            colon and distal transverse colon. The polyps were                            4 to 7 mm in size. These polyps were removed with a                            cold snare. Resection and retrieval were complete.                            Estimated blood loss was minimal.                           Many (10-15) flat polyps were found in the sigmoid                            colon. The polyps were 2 to 8 mm in size. Four of                            these polyps were removed with a cold snare.                            Resection and retrieval were complete. Estimated                            blood loss was minimal.                           The exam was otherwise normal  throughout the                            examined colon.                           The retroflexed view of the distal rectum and anal                            verge was normal and showed no anal or rectal                            abnormalities. Complications:            No immediate complications. Estimated Blood Loss:     Estimated blood loss was minimal. Impression:               - Perianal skin tags found on perianal exam.                           -  Two 5 to 6 mm polyps in the cecum, removed with a                            cold snare. Resected and retrieved.                           - Two 4 to 7 mm polyps in the transverse colon and                            in the distal transverse colon, removed with a cold                            snare. Resected and retrieved.                           - Many 2 to 8 mm polyps in the sigmoid colon,                            removed with a cold snare. Resected and retrieved.                            These appeared consistent with hyperplastic polyps                           - The distal rectum and anal verge are normal on                            retroflexion view. Recommendation:           - Patient has a contact number available for                            emergencies. The signs and symptoms of potential                            delayed complications were discussed with the                            patient. Return to normal activities tomorrow.                            Written discharge instructions were provided to the                            patient.                           - Resume previous diet.                           - Continue present medications.                           - Await pathology results.                           -  Repeat colonoscopy (date not yet determined) for                            surveillance based on pathology results. Natallia Stellmach E. Stacia, MD 01/30/2024 9:16:31 AM This report has been  signed electronically.

## 2024-01-30 NOTE — Progress Notes (Signed)
 [facility] History and Physical   Primary Care Physician:  Lorren Greig PARAS, NP   Reason for Procedure:   Colon cancer screening  Plan:    Colonoscopy     HPI: [name] is a 53 y.o. female undergoing initial average risk screening colonoscopy.  She has no chronic GI symptoms other than bloating and gas.  Her maternal aunt had colon cancer and  her mother had 13 polyps (size, histology not known) on a previous colonoscopy.    Past Medical History:  Diagnosis Date   Anemia    Anxiety    Diabetes mellitus    Type 2   Fibroid    HLD (hyperlipidemia)    Hypertension    Infection    UTI   Obesity    Panic attack    Vaginal delivery [phone]   Vaginal Pap smear, abnormal     Past Surgical History:  Procedure Laterality Date   KNEE SURGERY Bilateral    x 2 left, 1 right   ROBOTIC ASSISTED TOTAL HYSTERECTOMY WITH SALPINGECTOMY Bilateral 12/22/2015   Procedure: ROBOTIC ASSISTED TOTAL HYSTERECTOMY WITH BILATERAL SALPINGECTOMY;  Surgeon: Elveria Mungo, MD;  Location: [facility];  Service: Gynecology;  Laterality: Bilateral;    Prior to Admission medications   Medication Sig Start Date End Date Taking? Authorizing Provider  amLODipine  (NORVASC ) 5 MG tablet Take 1 tablet (5 mg total) by mouth daily. 01/22/24  Yes Turner, Wilbert SAUNDERS, MD  escitalopram  (LEXAPRO ) 5 MG tablet Take 1 tablet (5 mg total) by mouth daily. 12/04/23  Yes Lorren, Amy J, NP  metFORMIN  (GLUCOPHAGE ) 1000 MG tablet metformin  1,000 mg tablet  TAKE 1 TABLET TWICE A DAY BY ORAL ROUTE FOR 90 DAYS.   Yes [provider]  metoprolol  succinate (TOPROL  XL) 25 MG 24 hr tablet Take 1 tablet (25 mg total) by mouth at bedtime. 01/17/24  Yes Duke, Jon Garre, PA  nateglinide (STARLIX) 120 MG tablet Take 120 mg by mouth 3 (three) times daily. 08/10/21  Yes [provider]  rosuvastatin  (CRESTOR ) 10 MG tablet Take 1 tablet (10 mg total) by mouth daily. 01/26/24 04/25/24 Yes Turner,  Wilbert SAUNDERS, MD  famotidine  (PEPCID ) 20 MG tablet Take 1 tablet (20 mg total) by mouth 2 (two) times daily. 10/21/23 01/26/24  Cottie Donnice PARAS, MD  hydrOXYzine  (VISTARIL ) 25 MG capsule Take 1 capsule (25 mg total) by mouth every 8 (eight) hours as needed. Patient not taking: Reported on 01/30/2024 12/04/23   Lorren Greig PARAS, NP  tirzepatide  (MOUNJARO ) 10 MG/0.5ML Pen Inject 10 mg into the skin once a week. 11/01/23   Lorren Greig PARAS, NP  carbamazepine  (TEGRETOL ) 200 MG tablet 200 mg day 1, 200 mg twice daily for day 2 and 3, 400 mg twice daily for day 3 and 4. Use 400 mg twice daily, as needed, on days 5 through 7. 02/28/16 02/11/19  Joy, Shawn C, PA-C  glimepiride  (AMARYL ) 4 MG tablet Take 1 tablet (4 mg total) by mouth daily with breakfast. 12/23/15 02/11/19  Mungo Elveria, MD  lisinopril  (PRINIVIL ,ZESTRIL ) 5 MG tablet Take 5 mg by mouth daily.  02/11/19  [provider]  sitaGLIPtin -metformin  (JANUMET ) 50-1000 MG per tablet Take 1 tablet by mouth 2 (two) times daily with a meal.  02/11/19  [provider]    Current Outpatient Medications  Medication Sig Dispense Refill   amLODipine  (NORVASC ) 5 MG tablet Take 1 tablet (5 mg total) by mouth daily. 30 tablet 3   escitalopram  (LEXAPRO ) 5 MG  tablet Take 1 tablet (5 mg total) by mouth daily. 90 tablet 0   metFORMIN  (GLUCOPHAGE ) 1000 MG tablet metformin  1,000 mg tablet  TAKE 1 TABLET TWICE A DAY BY ORAL ROUTE FOR 90 DAYS.     metoprolol  succinate (TOPROL  XL) 25 MG 24 hr tablet Take 1 tablet (25 mg total) by mouth at bedtime. 90 tablet 3   nateglinide (STARLIX) 120 MG tablet Take 120 mg by mouth 3 (three) times daily.     rosuvastatin  (CRESTOR ) 10 MG tablet Take 1 tablet (10 mg total) by mouth daily. 90 tablet 3   famotidine  (PEPCID ) 20 MG tablet Take 1 tablet (20 mg total) by mouth 2 (two) times daily. 60 tablet 0   hydrOXYzine  (VISTARIL ) 25 MG capsule Take 1 capsule (25 mg total) by mouth every 8 (eight) hours as needed. (Patient not  taking: Reported on 01/30/2024) 30 capsule 2   tirzepatide  (MOUNJARO ) 10 MG/0.5ML Pen Inject 10 mg into the skin once a week. 6 mL 0   Current Facility-Administered Medications  Medication Dose Route Frequency Provider Last Rate Last Admin   0.9 %  sodium chloride  infusion  500 mL Intravenous Once Stacia Glendia BRAVO, MD        Allergies as of 01/30/2024 - Review Complete 01/30/2024  Allergen Reaction Noted   Aloe Other (See Comments) 11/01/2023   Bactrim [sulfamethoxazole -trimethoprim] Hives 12/06/2013   Oxycodone-acetaminophen Itching and Other (See Comments) 05/28/2011   Percocet [oxycodone-acetaminophen] Itching 05/28/2011    Family History  Problem Relation Age of Onset   Hypertension Mother    Colon polyps Mother    Diabetes Father    Hypertension Father    Diabetes Brother    Hypertension Brother    Clotting disorder Brother    Hypertension Brother    Diabetes Brother    Hypertension Maternal Grandmother    Hypertension Maternal Grandfather    Hypertension Paternal Grandmother    Hypertension Paternal Grandfather    Autism Daughter    Hypertension Daughter    Hypertension Son    Breast cancer Maternal Aunt    Colon cancer Maternal Aunt     Social History   Socioeconomic History   Marital status: Married    Spouse name: Not on file   Number of children: 3   Years of education: Not on file   Highest education level: Not on file  Occupational History   Not on file  Tobacco Use   Smoking status: Never   Smokeless tobacco: Never  Vaping Use   Vaping status: Never Used  Substance and Sexual Activity   Alcohol use: No   Drug use: No   Sexual activity: Yes    Birth control/protection: Post-menopausal  Other Topics Concern   Not on file  Social History Narrative   Not on file   Social Drivers of Health   Financial Resource Strain: Low Risk  (11/01/2023)   Overall Financial Resource Strain (CARDIA)    Difficulty of Paying Living Expenses: Not hard at all   Food Insecurity: No Food Insecurity (11/01/2023)   Hunger Vital Sign    Worried About Running Out of Food in the Last Year: Never true    Ran Out of Food in the Last Year: Never true  Transportation Needs: No Transportation Needs (11/01/2023)   PRAPARE - Administrator, Civil Service (Medical): No    Lack of Transportation (Non-Medical): No  Physical Activity: Inactive (11/01/2023)   Exercise Vital Sign    Days of Exercise per  Week: 0 days    Minutes of Exercise per Session: 0 min  Stress: No Stress Concern Present (11/01/2023)   [employer] of Occupational Health - Occupational Stress Questionnaire    Feeling of Stress : Not at all  Social Connections: Moderately Isolated (11/01/2023)   Social Connection and Isolation Panel    Frequency of Communication with Friends and Family: Twice a week    Frequency of Social Gatherings with Friends and Family: Twice a week    Attends Religious Services: 1 to 4 times per year    Active Member of [employer] or Organizations: No    Attends Banker Meetings: Never    Marital Status: Never married  Intimate Partner Violence: Not At Risk (11/01/2023)   Humiliation, Afraid, Rape, and Kick questionnaire    Fear of Current or Ex-Partner: No    Emotionally Abused: No    Physically Abused: No    Sexually Abused: No    Review of Systems:  All other review of systems negative except as mentioned in the HPI.  Physical Exam: Vital signs BP (!) 144/98   Pulse 96   Temp (!) 97.5 F (36.4 C)   Ht 5' 2 (1.575 m)   Wt 216 lb (98 kg)   LMP 10/02/2015   SpO2 98%   BMI 39.51 kg/m   General:   Alert,  Well-developed, well-nourished, pleasant and cooperative in NAD Airway:  Mallampati 2 Lungs:  Clear throughout to auscultation.   Heart:  Regular rate and rhythm; no murmurs, clicks, rubs,  or gallops. Abdomen:  Soft, nontender and nondistended. Normal bowel sounds.   Neuro/Psych:  Normal mood and affect. A and O x 3   Jair Lindblad E.  Stacia, MD [facility]

## 2024-01-31 ENCOUNTER — Telehealth: Payer: Self-pay

## 2024-01-31 NOTE — Telephone Encounter (Signed)
  Follow up Call-     01/30/2024    8:17 AM  Call back number  Post procedure Call Back phone  # [phone]  Permission to leave phone message Yes     Patient questions:  Do you have a fever, pain , or abdominal swelling? No. Pain Score  0 *  Have you tolerated food without any problems? Yes.    Have you been able to return to your normal activities? Yes.    Do you have any questions about your discharge instructions: Diet   No. Medications  No. Follow up visit  No.  Do you have questions or concerns about your Care? No.  Actions: * If pain score is 4 or above: No action needed, pain <4.

## 2024-02-01 LAB — SURGICAL PATHOLOGY

## 2024-02-05 ENCOUNTER — Ambulatory Visit: Payer: Self-pay | Admitting: Gastroenterology

## 2024-02-05 NOTE — Progress Notes (Signed)
 Ms. Grimley,  Only two polyps which I removed during your recent procedure were proven to be completely benign but are considered pre-cancerous polyps that MAY have grown into cancer if they had not been removed.  The other polyps were not precancerous.  Studies shows that at least 20% of women over age 53 and 30% of men over age 46 have pre-cancerous polyps.  Based on current nationally recognized surveillance guidelines, I recommend that you have a repeat colonoscopy in 7 years.   If you develop any new rectal bleeding, abdominal pain or significant bowel habit changes, please contact me before then.

## 2024-02-06 ENCOUNTER — Telehealth: Payer: Self-pay

## 2024-02-06 ENCOUNTER — Other Ambulatory Visit: Payer: Self-pay

## 2024-02-06 NOTE — Telephone Encounter (Signed)
 Pharmacy Patient Advocate Encounter   Received notification from CoverMyMeds that prior authorization for MOUNJARO  is required/requested.   Insurance verification completed.   The patient is insured through [email or web address] .   Per test claim: PA required; PA submitted to above mentioned insurance via CoverMyMeds Key/confirmation #/EOC [id number] Status is pending

## 2024-02-07 ENCOUNTER — Telehealth: Payer: Self-pay

## 2024-02-07 ENCOUNTER — Other Ambulatory Visit: Payer: Self-pay

## 2024-02-07 ENCOUNTER — Ambulatory Visit (INDEPENDENT_AMBULATORY_CARE_PROVIDER_SITE_OTHER): Admitting: Family

## 2024-02-07 ENCOUNTER — Ambulatory Visit: Payer: Self-pay | Admitting: Family

## 2024-02-07 ENCOUNTER — Encounter: Payer: Self-pay | Admitting: Family

## 2024-02-07 VITALS — BP 120/83 | HR 90 | Temp 97.6°F | Resp 16 | Ht 62.0 in | Wt 211.6 lb

## 2024-02-07 DIAGNOSIS — F32A Depression, unspecified: Secondary | ICD-10-CM | POA: Diagnosis not present

## 2024-02-07 DIAGNOSIS — Z131 Encounter for screening for diabetes mellitus: Secondary | ICD-10-CM

## 2024-02-07 DIAGNOSIS — F419 Anxiety disorder, unspecified: Secondary | ICD-10-CM | POA: Diagnosis not present

## 2024-02-07 LAB — POCT GLYCOSYLATED HEMOGLOBIN (HGB A1C): Hemoglobin A1C: 5.9 % — AB (ref 4.0–5.6)

## 2024-02-07 MED ORDER — ESCITALOPRAM OXALATE 5 MG PO TABS: 2.5000 mg | ORAL_TABLET | Freq: Every day | ORAL | 0 refills | Status: DC

## 2024-02-07 NOTE — Progress Notes (Signed)
 Patient ID: [name], female    DOB: 09/22/1970  MRN: [id number]  CC: Chronic Conditions Follow-Up  Subjective: [name] is a 53 y.o. female who presents for chronic conditions follow-up. She is accompanied by her daughter.  Her concerns today include:  - Doing well on Escitalopram , no issues/concerns and ready to quit taking. States she never took Hydroxyzine . She denies thoughts of self-harm, suicidal ideations, homicidal ideations. - Diabetes screening. - Established with Cardiology.   Patient Active Problem List   Diagnosis Date Noted   Paroxysmal SVT (supraventricular tachycardia) (HCC) 01/17/2024   Fibroids 12/22/2015   Post-operative state 12/22/2015   DUB (dysfunctional uterine bleeding) 10/12/2015   Obesity 03/01/2012   Diabetes mellitus (HCC) 10/26/2011     Current Outpatient Medications on File Prior to Visit  Medication Sig Dispense Refill   amLODipine  (NORVASC ) 5 MG tablet Take 1 tablet (5 mg total) by mouth daily. 30 tablet 3   famotidine  (PEPCID ) 20 MG tablet Take 1 tablet (20 mg total) by mouth 2 (two) times daily. 60 tablet 0   metFORMIN  (GLUCOPHAGE ) 1000 MG tablet metformin  1,000 mg tablet  TAKE 1 TABLET TWICE A DAY BY ORAL ROUTE FOR 90 DAYS.     metoprolol  succinate (TOPROL  XL) 25 MG 24 hr tablet Take 1 tablet (25 mg total) by mouth at bedtime. 90 tablet 3   nateglinide (STARLIX) 120 MG tablet Take 120 mg by mouth 3 (three) times daily.     rosuvastatin  (CRESTOR ) 10 MG tablet Take 1 tablet (10 mg total) by mouth daily. 90 tablet 3   tirzepatide  (MOUNJARO ) 10 MG/0.5ML Pen Inject 10 mg into the skin once a week. 6 mL 0   hydrOXYzine  (VISTARIL ) 25 MG capsule Take 1 capsule (25 mg total) by mouth every 8 (eight) hours as needed. (Patient not taking: Reported on 01/30/2024) 30 capsule 2   [DISCONTINUED] carbamazepine  (TEGRETOL ) 200 MG tablet 200 mg day 1, 200 mg twice daily for day 2 and 3, 400 mg twice daily for day 3 and 4. Use 400 mg twice daily,  as needed, on days 5 through 7. 30 tablet 0   [DISCONTINUED] glimepiride  (AMARYL ) 4 MG tablet Take 1 tablet (4 mg total) by mouth daily with breakfast. 30 tablet 1   [DISCONTINUED] lisinopril  (PRINIVIL ,ZESTRIL ) 5 MG tablet Take 5 mg by mouth daily.     [DISCONTINUED] sitaGLIPtin -metformin  (JANUMET ) 50-1000 MG per tablet Take 1 tablet by mouth 2 (two) times daily with a meal.     No current facility-administered medications on file prior to visit.    Allergies  Allergen Reactions   Aloe Other (See Comments)   Bactrim [Sulfamethoxazole -Trimethoprim] Hives   Oxycodone-Acetaminophen Itching and Other (See Comments)   Percocet [Oxycodone-Acetaminophen] Itching    Social History   Socioeconomic History   Marital status: Married    Spouse name: Not on file   Number of children: 3   Years of education: Not on file   Highest education level: Not on file  Occupational History   Not on file  Tobacco Use   Smoking status: Never   Smokeless tobacco: Never  Vaping Use   Vaping status: Never Used  Substance and Sexual Activity   Alcohol use: No   Drug use: No   Sexual activity: Yes    Birth control/protection: Post-menopausal  Other Topics Concern   Not on file  Social History Narrative   Not on file   Social Drivers of Health   Financial Resource Strain: Low Risk  (  11/01/2023)   Overall Financial Resource Strain (CARDIA)    Difficulty of Paying Living Expenses: Not hard at all  Food Insecurity: No Food Insecurity (11/01/2023)   Hunger Vital Sign    Worried About Running Out of Food in the Last Year: Never true    Ran Out of Food in the Last Year: Never true  Transportation Needs: No Transportation Needs (11/01/2023)   PRAPARE - Administrator, Civil Service (Medical): No    Lack of Transportation (Non-Medical): No  Physical Activity: Inactive (11/01/2023)   Exercise Vital Sign    Days of Exercise per Week: 0 days    Minutes of Exercise per Session: 0 min  Stress: No  Stress Concern Present (11/01/2023)   [employer] of Occupational Health - Occupational Stress Questionnaire    Feeling of Stress : Not at all  Social Connections: Moderately Isolated (11/01/2023)   Social Connection and Isolation Panel    Frequency of Communication with Friends and Family: Twice a week    Frequency of Social Gatherings with Friends and Family: Twice a week    Attends Religious Services: 1 to 4 times per year    Active Member of [employer] or Organizations: No    Attends Banker Meetings: Never    Marital Status: Never married  Intimate Partner Violence: Not At Risk (11/01/2023)   Humiliation, Afraid, Rape, and Kick questionnaire    Fear of Current or Ex-Partner: No    Emotionally Abused: No    Physically Abused: No    Sexually Abused: No    Family History  Problem Relation Age of Onset   Hypertension Mother    Colon polyps Mother    Diabetes Father    Hypertension Father    Diabetes Brother    Hypertension Brother    Clotting disorder Brother    Hypertension Brother    Diabetes Brother    Hypertension Maternal Grandmother    Hypertension Maternal Grandfather    Hypertension Paternal Grandmother    Hypertension Paternal Grandfather    Autism Daughter    Hypertension Daughter    Hypertension Son    Breast cancer Maternal Aunt    Colon cancer Maternal Aunt     Past Surgical History:  Procedure Laterality Date   KNEE SURGERY Bilateral    x 2 left, 1 right   ROBOTIC ASSISTED TOTAL HYSTERECTOMY WITH SALPINGECTOMY Bilateral 12/22/2015   Procedure: ROBOTIC ASSISTED TOTAL HYSTERECTOMY WITH BILATERAL SALPINGECTOMY;  Surgeon: Veronica Mungo, MD;  Location: [facility];  Service: Gynecology;  Laterality: Bilateral;    ROS: Review of Systems Negative except as stated above  PHYSICAL EXAM: BP 120/83   Pulse 90   Temp 97.6 F (36.4 C) (Oral)   Resp 16   Ht 5' 2 (1.575 m)   Wt 211 lb 9.6 oz (96 kg)   LMP 10/02/2015   SpO2 94%   BMI 38.70  kg/m   Physical Exam HENT:     Head: Normocephalic and atraumatic.     Nose: Nose normal.     Mouth/Throat:     Mouth: Mucous membranes are moist.     Pharynx: Oropharynx is clear.  Eyes:     Extraocular Movements: Extraocular movements intact.     Conjunctiva/sclera: Conjunctivae normal.     Pupils: Pupils are equal, round, and reactive to light.  Cardiovascular:     Rate and Rhythm: Normal rate and regular rhythm.     Pulses: Normal pulses.     Heart sounds:  Normal heart sounds.  Pulmonary:     Effort: Pulmonary effort is normal.     Breath sounds: Normal breath sounds.  Musculoskeletal:        General: Normal range of motion.     Cervical back: Normal range of motion and neck supple.  Neurological:     General: No focal deficit present.     Mental Status: She is alert and oriented to person, place, and time.  Psychiatric:        Mood and Affect: Mood normal.        Behavior: Behavior normal.     ASSESSMENT AND PLAN: 1. Anxiety and depression (Primary) - Patient denies thoughts of self-harm, suicidal ideations, homicidal ideations. - Decrease Escitalopram  from 5 mg to 2.5 mg as prescribed. Counseled on medication adherence/adverse effects.  - Follow-up with primary provider in 4 weeks or sooner if needed. - escitalopram  (LEXAPRO ) 5 MG tablet; Take 0.5 tablets (2.5 mg total) by mouth daily.  Dispense: 90 tablet; Refill: 0  2. Diabetes mellitus screening - Routine screening.  - POCT glycosylated hemoglobin (Hb A1C); Future   Patient was given the opportunity to ask questions.  Patient verbalized understanding of the plan and was able to repeat key elements of the plan. Patient was given clear instructions to go to Emergency Department or return to medical center if symptoms don't improve, worsen, or new problems develop.The patient verbalized understanding.   Requested Prescriptions   Signed Prescriptions Disp Refills   escitalopram  (LEXAPRO ) 5 MG tablet 90 tablet 0     Sig: Take 0.5 tablets (2.5 mg total) by mouth daily.    Return in about 4 weeks (around 03/06/2024) for Follow-Up or next available chronic conditions.  Greig [name] Drones, NP

## 2024-02-07 NOTE — Telephone Encounter (Signed)
 Pharmacy Patient Advocate Encounter  Received notification from [facility] that Prior Authorization for MOUNJARO  has been APPROVED from 02/06/2024 to 02/05/2025

## 2024-02-07 NOTE — Progress Notes (Signed)
 Patient wants diabetic screening, medication refill

## 2024-02-14 ENCOUNTER — Other Ambulatory Visit: Payer: Self-pay

## 2024-03-13 ENCOUNTER — Other Ambulatory Visit: Payer: Self-pay | Admitting: Family

## 2024-03-13 DIAGNOSIS — E1165 Type 2 diabetes mellitus with hyperglycemia: Secondary | ICD-10-CM

## 2024-03-14 NOTE — Telephone Encounter (Signed)
 Complete

## 2024-03-21 ENCOUNTER — Encounter: Payer: Self-pay | Admitting: Family

## 2024-03-21 ENCOUNTER — Ambulatory Visit: Admitting: Family

## 2024-03-21 VITALS — BP 112/79 | HR 97 | Temp 98.1°F | Resp 16 | Ht 62.0 in | Wt 214.2 lb

## 2024-03-21 DIAGNOSIS — R21 Rash and other nonspecific skin eruption: Secondary | ICD-10-CM

## 2024-03-21 DIAGNOSIS — F32A Depression, unspecified: Secondary | ICD-10-CM

## 2024-03-21 DIAGNOSIS — F419 Anxiety disorder, unspecified: Secondary | ICD-10-CM

## 2024-03-21 MED ORDER — TRIAMCINOLONE ACETONIDE 0.025 % EX CREA: 1.0000 | TOPICAL_CREAM | Freq: Two times a day (BID) | CUTANEOUS | 2 refills | Status: AC

## 2024-03-21 NOTE — Progress Notes (Signed)
 4 week follow up, dark spots on her leg

## 2024-03-21 NOTE — Progress Notes (Signed)
 Patient ID: [name], female    DOB: November 29, 1970  MRN: [id number]  CC: Chronic Conditions Follow-Up  Subjective: [name] is a 53 y.o. female who presents for chronic conditions follow-up.   Her concerns today include:  - States she never began taking Escitalopram  due to she is helping take care of her brother who was recently hospitalized. States she plans to begin taking Escitalopram  soon once her brother moves out of her home. She denies thoughts of self-harm, suicidal ideations, homicidal ideations. - States dark spot on right foot shin. Denies red flag symptoms.   Patient Active Problem List   Diagnosis Date Noted   Paroxysmal SVT (supraventricular tachycardia) 01/17/2024   Fibroids 12/22/2015   Post-operative state 12/22/2015   DUB (dysfunctional uterine bleeding) 10/12/2015   Obesity 03/01/2012   Diabetes mellitus (HCC) 10/26/2011     Current Outpatient Medications on File Prior to Visit  Medication Sig Dispense Refill   amLODipine  (NORVASC ) 5 MG tablet Take 1 tablet (5 mg total) by mouth daily. 30 tablet 3   escitalopram  (LEXAPRO ) 5 MG tablet Take 0.5 tablets (2.5 mg total) by mouth daily. 90 tablet 0   famotidine  (PEPCID ) 20 MG tablet Take 1 tablet (20 mg total) by mouth 2 (two) times daily. 60 tablet 0   metFORMIN  (GLUCOPHAGE ) 1000 MG tablet metformin  1,000 mg tablet  TAKE 1 TABLET TWICE A DAY BY ORAL ROUTE FOR 90 DAYS.     metoprolol  succinate (TOPROL  XL) 25 MG 24 hr tablet Take 1 tablet (25 mg total) by mouth at bedtime. 90 tablet 3   nateglinide (STARLIX) 120 MG tablet Take 120 mg by mouth 3 (three) times daily.     rosuvastatin  (CRESTOR ) 10 MG tablet Take 1 tablet (10 mg total) by mouth daily. 90 tablet 3   tirzepatide  (MOUNJARO ) 10 MG/0.5ML Pen INJECT 10 MG INTO THE SKIN ONE TIME PER WEEK 6 mL 0   hydrOXYzine  (VISTARIL ) 25 MG capsule Take 1 capsule (25 mg total) by mouth every 8 (eight) hours as needed. (Patient not taking: Reported on 01/30/2024) 30  capsule 2   [DISCONTINUED] carbamazepine  (TEGRETOL ) 200 MG tablet 200 mg day 1, 200 mg twice daily for day 2 and 3, 400 mg twice daily for day 3 and 4. Use 400 mg twice daily, as needed, on days 5 through 7. 30 tablet 0   [DISCONTINUED] glimepiride  (AMARYL ) 4 MG tablet Take 1 tablet (4 mg total) by mouth daily with breakfast. 30 tablet 1   [DISCONTINUED] lisinopril  (PRINIVIL ,ZESTRIL ) 5 MG tablet Take 5 mg by mouth daily.     [DISCONTINUED] sitaGLIPtin -metformin  (JANUMET ) 50-1000 MG per tablet Take 1 tablet by mouth 2 (two) times daily with a meal.     No current facility-administered medications on file prior to visit.    Allergies  Allergen Reactions   Aloe Other (See Comments)   Bactrim [Sulfamethoxazole -Trimethoprim] Hives   Oxycodone-Acetaminophen Itching and Other (See Comments)   Percocet [Oxycodone-Acetaminophen] Itching    Social History   Socioeconomic History   Marital status: Married    Spouse name: Not on file   Number of children: 3   Years of education: Not on file   Highest education level: Not on file  Occupational History   Not on file  Tobacco Use   Smoking status: Never   Smokeless tobacco: Never  Vaping Use   Vaping status: Never Used  Substance and Sexual Activity   Alcohol use: No   Drug use: No   Sexual  activity: Yes    Birth control/protection: Post-menopausal  Other Topics Concern   Not on file  Social History Narrative   Not on file   Social Drivers of Health   Financial Resource Strain: Low Risk  (11/01/2023)   Overall Financial Resource Strain (CARDIA)    Difficulty of Paying Living Expenses: Not hard at all  Food Insecurity: No Food Insecurity (11/01/2023)   Hunger Vital Sign    Worried About Running Out of Food in the Last Year: Never true    Ran Out of Food in the Last Year: Never true  Transportation Needs: No Transportation Needs (11/01/2023)   PRAPARE - Administrator, Civil Service (Medical): No    Lack of Transportation  (Non-Medical): No  Physical Activity: Inactive (11/01/2023)   Exercise Vital Sign    Days of Exercise per Week: 0 days    Minutes of Exercise per Session: 0 min  Stress: No Stress Concern Present (11/01/2023)   [employer] of Occupational Health - Occupational Stress Questionnaire    Feeling of Stress : Not at all  Social Connections: Moderately Isolated (11/01/2023)   Social Connection and Isolation Panel    Frequency of Communication with Friends and Family: Twice a week    Frequency of Social Gatherings with Friends and Family: Twice a week    Attends Religious Services: 1 to 4 times per year    Active Member of [employer] or Organizations: No    Attends Banker Meetings: Never    Marital Status: Never married  Intimate Partner Violence: Not At Risk (11/01/2023)   Humiliation, Afraid, Rape, and Kick questionnaire    Fear of Current or Ex-Partner: No    Emotionally Abused: No    Physically Abused: No    Sexually Abused: No    Family History  Problem Relation Age of Onset   Hypertension Mother    Colon polyps Mother    Diabetes Father    Hypertension Father    Diabetes Brother    Hypertension Brother    Clotting disorder Brother    Hypertension Brother    Diabetes Brother    Hypertension Maternal Grandmother    Hypertension Maternal Grandfather    Hypertension Paternal Grandmother    Hypertension Paternal Grandfather    Autism Daughter    Hypertension Daughter    Hypertension Son    Breast cancer Maternal Aunt    Colon cancer Maternal Aunt     Past Surgical History:  Procedure Laterality Date   KNEE SURGERY Bilateral    x 2 left, 1 right   ROBOTIC ASSISTED TOTAL HYSTERECTOMY WITH SALPINGECTOMY Bilateral 12/22/2015   Procedure: ROBOTIC ASSISTED TOTAL HYSTERECTOMY WITH BILATERAL SALPINGECTOMY;  Surgeon: Elveria Mungo, MD;  Location: [facility];  Service: Gynecology;  Laterality: Bilateral;    ROS: Review of Systems Negative except as stated  above  PHYSICAL EXAM: BP 112/79   Pulse 97   Temp 98.1 F (36.7 C) (Oral)   Resp 16   Ht 5' 2 (1.575 m)   Wt 214 lb 3.2 oz (97.2 kg)   LMP 10/02/2015   SpO2 96%   BMI 39.18 kg/m   Physical Exam HENT:     Head: Normocephalic and atraumatic.     Nose: Nose normal.     Mouth/Throat:     Mouth: Mucous membranes are moist.     Pharynx: Oropharynx is clear.  Eyes:     Extraocular Movements: Extraocular movements intact.     Conjunctiva/sclera: Conjunctivae normal.  Pupils: Pupils are equal, round, and reactive to light.  Cardiovascular:     Rate and Rhythm: Normal rate and regular rhythm.     Pulses: Normal pulses.     Heart sounds: Normal heart sounds.  Pulmonary:     Effort: Pulmonary effort is normal.     Breath sounds: Normal breath sounds.  Musculoskeletal:        General: Normal range of motion.     Cervical back: Normal range of motion and neck supple.  Skin:    General: Skin is warm and dry.     Findings: Rash present.  Neurological:     General: No focal deficit present.     Mental Status: She is alert and oriented to person, place, and time.  Psychiatric:        Mood and Affect: Mood normal.        Behavior: Behavior normal.     ASSESSMENT AND PLAN: 1. Anxiety and depression (Primary) - Patient denies thoughts of self-harm, suicidal ideations, homicidal ideations. - Escitalopram  as prescribed. Counseled on medication adherence/adverse effects. No refills needed as of present.  - Follow-up with primary provider in 4 weeks or sooner if needed.  2. Rash and nonspecific skin eruption - Triamcinolone  as prescribed. Counseled on medication adherence/adverse effects.  - Follow-up with primary provider in 4 weeks or sooner if needed.  - triamcinolone  (KENALOG ) 0.025 % cream; Apply 1 Application topically 2 (two) times daily.  Dispense: 80 g; Refill: 2   Patient was given the opportunity to ask questions.  Patient verbalized understanding of the plan and was  able to repeat key elements of the plan. Patient was given clear instructions to go to Emergency Department or return to medical center if symptoms don't improve, worsen, or new problems develop.The patient verbalized understanding.   Requested Prescriptions   Signed Prescriptions Disp Refills   triamcinolone  (KENALOG ) 0.025 % cream 80 g 2    Sig: Apply 1 Application topically 2 (two) times daily.    Return in about 4 weeks (around 04/18/2024) for Follow-Up or next available chronic conditions.  Veronica [name] Drones, NP

## 2024-04-29 ENCOUNTER — Encounter: Payer: Self-pay | Admitting: Radiology

## 2024-05-02 ENCOUNTER — Other Ambulatory Visit: Payer: Self-pay | Admitting: Family

## 2024-05-02 DIAGNOSIS — F32A Depression, unspecified: Secondary | ICD-10-CM

## 2024-05-03 NOTE — Telephone Encounter (Signed)
 Complete

## 2024-05-10 ENCOUNTER — Ambulatory Visit: Admitting: Obstetrics & Gynecology

## 2024-05-21 NOTE — Progress Notes (Shared)
 [facility] - Clinic Note  06/04/2024   CHIEF COMPLAINT Patient presents for No chief complaint on file.  HISTORY OF PRESENT ILLNESS: [name] is a 53 y.o. female who presents to the clinic today for:   Pt is here on the referral of Schuyler Molt, PA-C for DM exam, pt states she is on metformin  and Mounjaro , her last A1c was 7.0 on 10.21.24, but it's been as high as 12, pt has seen Dr. Ladora for routine eye exams, pt states she has noticed eye strain due to her job, she is on the computer all day, she denies any kidney or peripheral nerve problems   Referring physician: Practice, [facility] [address] [facility] [address] [place],  [place] [address]  HISTORICAL INFORMATION:  Selected notes from the MEDICAL RECORD NUMBER Referred by Schuyler Molt, PA-C for DM exam LEE:  Ocular Hx- PMH-   CURRENT MEDICATIONS: No current outpatient medications on file. (Ophthalmic Drugs)   No current facility-administered medications for this visit. (Ophthalmic Drugs)   Current Outpatient Medications (Other)  Medication Sig   amLODipine  (NORVASC ) 5 MG tablet Take 1 tablet (5 mg total) by mouth daily.   escitalopram  (LEXAPRO ) 5 MG tablet TAKE 1 TABLET (5 MG TOTAL) BY MOUTH DAILY.   famotidine  (PEPCID ) 20 MG tablet Take 1 tablet (20 mg total) by mouth 2 (two) times daily.   hydrOXYzine  (VISTARIL ) 25 MG capsule Take 1 capsule (25 mg total) by mouth every 8 (eight) hours as needed. (Patient not taking: Reported on 01/30/2024)   metFORMIN  (GLUCOPHAGE ) 1000 MG tablet metformin  1,000 mg tablet  TAKE 1 TABLET TWICE A DAY BY ORAL ROUTE FOR 90 DAYS.   metoprolol  succinate (TOPROL  XL) 25 MG 24 hr tablet Take 1 tablet (25 mg total) by mouth at bedtime.   nateglinide (STARLIX) 120 MG tablet Take 120 mg by mouth 3 (three) times daily.   rosuvastatin  (CRESTOR ) 10 MG tablet Take 1 tablet (10 mg total) by mouth daily.   tirzepatide  (MOUNJARO ) 10 MG/0.5ML Pen INJECT 10 MG INTO THE  SKIN ONE TIME PER WEEK   triamcinolone  (KENALOG ) 0.025 % cream Apply 1 Application topically 2 (two) times daily.   No current facility-administered medications for this visit. (Other)   REVIEW OF SYSTEMS:   ALLERGIES Allergies  Allergen Reactions   Aloe Other (See Comments)   Bactrim [Sulfamethoxazole -Trimethoprim] Hives   Oxycodone-Acetaminophen Itching and Other (See Comments)   Percocet [Oxycodone-Acetaminophen] Itching   PAST MEDICAL HISTORY Past Medical History:  Diagnosis Date   Anemia    Anxiety    Diabetes mellitus    Type 2   Fibroid    HLD (hyperlipidemia)    Hypertension    Infection    UTI   Obesity    Panic attack    Vaginal delivery [phone]   Vaginal Pap smear, abnormal    Past Surgical History:  Procedure Laterality Date   KNEE SURGERY Bilateral    x 2 left, 1 right   ROBOTIC ASSISTED TOTAL HYSTERECTOMY WITH SALPINGECTOMY Bilateral 12/22/2015   Procedure: ROBOTIC ASSISTED TOTAL HYSTERECTOMY WITH BILATERAL SALPINGECTOMY;  Surgeon: Elveria Mungo, MD;  Location: [facility];  Service: Gynecology;  Laterality: Bilateral;   FAMILY HISTORY Family History  Problem Relation Age of Onset   Hypertension Mother    Colon polyps Mother    Diabetes Father    Hypertension Father    Diabetes Brother    Hypertension Brother    Clotting disorder Brother  Hypertension Brother    Diabetes Brother    Hypertension Maternal Grandmother    Hypertension Maternal Grandfather    Hypertension Paternal Grandmother    Hypertension Paternal Grandfather    Autism Daughter    Hypertension Daughter    Hypertension Son    Breast cancer Maternal Aunt    Colon cancer Maternal Aunt    SOCIAL HISTORY Social History   Tobacco Use   Smoking status: Never   Smokeless tobacco: Never  Vaping Use   Vaping status: Never Used  Substance Use Topics   Alcohol use: No   Drug use: No       OPHTHALMIC EXAM:  Not recorded    IMAGING AND PROCEDURES  Imaging  and Procedures for 06/04/2024         ASSESSMENT/PLAN: No diagnosis found.  1-3. Diabetes mellitus, type 2 without retinopathy  - last A1c 7.0 (10.31.24) - The incidence, risk factors for progression, natural history and treatment options for diabetic retinopathy  were discussed with patient.   - The need for close monitoring of blood glucose, blood pressure, and serum lipids, avoiding cigarette or any type of tobacco, and the need for long term follow up was also discussed with patient. - f/u in 1 year, sooner prn  4,5. Hypertensive retinopathy OU - discussed importance of tight BP control - monitor  6. Mixed Cataract OU - The symptoms of cataract, surgical options, and treatments and risks were discussed with patient. - discussed diagnosis and progression - pt has significant PSC OD and reports significant glare symptoms - BCVA 20/40 OD - will refer to [facility] for cataract consult .  Ophthalmic Meds Ordered this visit:  No orders of the defined types were placed in this encounter.    No follow-ups on file.  There are no Patient Instructions on file for this visit.  Explained the diagnoses, plan, and follow up with the patient and they expressed understanding.  Patient expressed understanding of the importance of proper follow up care.  This document serves as a record of services personally performed by Redell JUDITHANN Hans, MD, PhD. It was created on their behalf by Wanda GEANNIE Keens, COT an ophthalmic technician. The creation of this record is the provider's dictation and/or activities during the visit.    Electronically signed by:  Wanda GEANNIE Keens, COT  05/21/24 12:28 PM   Redell JUDITHANN Hans, M.D., Ph.D. Diseases & Surgery of the Retina and Vitreous [facility] 06/04/2024    Abbreviations: M myopia (nearsighted); A astigmatism; H hyperopia (farsighted); P presbyopia; Mrx spectacle prescription;  CTL contact lenses; OD right eye; OS left  eye; OU both eyes  XT exotropia; ET esotropia; PEK punctate epithelial keratitis; PEE punctate epithelial erosions; DES dry eye syndrome; MGD meibomian gland dysfunction; ATs artificial tears; PFAT's preservative free artificial tears; NSC nuclear sclerotic cataract; PSC posterior subcapsular cataract; ERM epi-retinal membrane; PVD posterior vitreous detachment; RD retinal detachment; DM diabetes mellitus; DR diabetic retinopathy; NPDR non-proliferative diabetic retinopathy; PDR proliferative diabetic retinopathy; CSME clinically significant macular edema; DME diabetic macular edema; dbh dot blot hemorrhages; CWS cotton wool spot; POAG primary open angle glaucoma; C/D cup-to-disc ratio; HVF humphrey visual field; GVF goldmann visual field; OCT optical coherence tomography; IOP intraocular pressure; BRVO Branch retinal vein occlusion; CRVO central retinal vein occlusion; CRAO central retinal artery occlusion; BRAO branch retinal artery occlusion; RT retinal tear; SB scleral buckle; PPV pars plana vitrectomy; VH Vitreous hemorrhage; PRP panretinal laser photocoagulation; IVK intravitreal kenalog ; VMT  vitreomacular traction; MH Macular hole;  NVD neovascularization of the disc; NVE neovascularization elsewhere; AREDS age related eye disease study; ARMD age related macular degeneration; POAG primary open angle glaucoma; EBMD epithelial/anterior basement membrane dystrophy; ACIOL anterior chamber intraocular lens; IOL intraocular lens; PCIOL posterior chamber intraocular lens; Phaco/IOL phacoemulsification with intraocular lens placement; PRK photorefractive keratectomy; LASIK laser assisted in situ keratomileusis; HTN hypertension; DM diabetes mellitus; COPD chronic obstructive pulmonary disease

## 2024-05-29 ENCOUNTER — Other Ambulatory Visit: Payer: Self-pay | Admitting: Family

## 2024-05-29 DIAGNOSIS — E1165 Type 2 diabetes mellitus with hyperglycemia: Secondary | ICD-10-CM

## 2024-05-29 NOTE — Telephone Encounter (Signed)
 Complete

## 2024-06-04 ENCOUNTER — Encounter (INDEPENDENT_AMBULATORY_CARE_PROVIDER_SITE_OTHER): Payer: No Typology Code available for payment source | Admitting: Ophthalmology

## 2024-06-04 DIAGNOSIS — H25813 Combined forms of age-related cataract, bilateral: Secondary | ICD-10-CM

## 2024-06-04 DIAGNOSIS — Z7985 Long-term (current) use of injectable non-insulin antidiabetic drugs: Secondary | ICD-10-CM

## 2024-06-04 DIAGNOSIS — E119 Type 2 diabetes mellitus without complications: Secondary | ICD-10-CM

## 2024-06-04 DIAGNOSIS — I10 Essential (primary) hypertension: Secondary | ICD-10-CM

## 2024-06-04 DIAGNOSIS — H35033 Hypertensive retinopathy, bilateral: Secondary | ICD-10-CM

## 2024-06-04 DIAGNOSIS — Z7984 Long term (current) use of oral hypoglycemic drugs: Secondary | ICD-10-CM

## 2024-07-15 NOTE — Progress Notes (Shared)
 " [facility] - Clinic Note  07/17/2024   CHIEF COMPLAINT Patient presents for No chief complaint on file.  HISTORY OF PRESENT ILLNESS: [name] is a 54 y.o. female who presents to the clinic today for:   Pt states   Referring physician: Practice[facility] [address] [place] [place],  [place] [address]  HISTORICAL INFORMATION:  Selected notes from the MEDICAL RECORD NUMBER Referred by Schuyler Molt, PA-C for DM exam LEE:  Ocular Hx- PMH-   CURRENT MEDICATIONS: No current outpatient medications on file. (Ophthalmic Drugs)   No current facility-administered medications for this visit. (Ophthalmic Drugs)   Current Outpatient Medications (Other)  Medication Sig   amLODipine  (NORVASC ) 5 MG tablet Take 1 tablet (5 mg total) by mouth daily.   escitalopram  (LEXAPRO ) 5 MG tablet TAKE 1 TABLET (5 MG TOTAL) BY MOUTH DAILY.   famotidine  (PEPCID ) 20 MG tablet Take 1 tablet (20 mg total) by mouth 2 (two) times daily.   hydrOXYzine  (VISTARIL ) 25 MG capsule Take 1 capsule (25 mg total) by mouth every 8 (eight) hours as needed. (Patient not taking: Reported on 01/30/2024)   metFORMIN  (GLUCOPHAGE ) 1000 MG tablet metformin  1,000 mg tablet  TAKE 1 TABLET TWICE A DAY BY ORAL ROUTE FOR 90 DAYS.   metoprolol  succinate (TOPROL  XL) 25 MG 24 hr tablet Take 1 tablet (25 mg total) by mouth at bedtime.   nateglinide (STARLIX) 120 MG tablet Take 120 mg by mouth 3 (three) times daily.   rosuvastatin  (CRESTOR ) 10 MG tablet Take 1 tablet (10 mg total) by mouth daily.   tirzepatide  (MOUNJARO ) 10 MG/0.5ML Pen INJECT 10 MG INTO THE SKIN ONE TIME PER WEEK   triamcinolone  (KENALOG ) 0.025 % cream Apply 1 Application topically 2 (two) times daily.   No current facility-administered medications for this visit. (Other)   REVIEW OF SYSTEMS:   ALLERGIES Allergies  Allergen Reactions   Aloe Other (See Comments)   Bactrim [Sulfamethoxazole -Trimethoprim] Hives    Oxycodone-Acetaminophen Itching and Other (See Comments)   Percocet [Oxycodone-Acetaminophen] Itching   PAST MEDICAL HISTORY Past Medical History:  Diagnosis Date   Anemia    Anxiety    Diabetes mellitus    Type 2   Fibroid    HLD (hyperlipidemia)    Hypertension    Infection    UTI   Obesity    Panic attack    Vaginal delivery [phone]   Vaginal Pap smear, abnormal    Past Surgical History:  Procedure Laterality Date   KNEE SURGERY Bilateral    x 2 left, 1 right   ROBOTIC ASSISTED TOTAL HYSTERECTOMY WITH SALPINGECTOMY Bilateral 12/22/2015   Procedure: ROBOTIC ASSISTED TOTAL HYSTERECTOMY WITH BILATERAL SALPINGECTOMY;  Surgeon: Elveria Mungo, MD;  Location: [facility];  Service: Gynecology;  Laterality: Bilateral;   FAMILY HISTORY Family History  Problem Relation Age of Onset   Hypertension Mother    Colon polyps Mother    Diabetes Father    Hypertension Father    Diabetes Brother    Hypertension Brother    Clotting disorder Brother    Hypertension Brother    Diabetes Brother    Hypertension Maternal Grandmother    Hypertension Maternal Grandfather    Hypertension Paternal Grandmother    Hypertension Paternal Grandfather    Autism Daughter    Hypertension Daughter    Hypertension Son    Breast cancer Maternal Aunt    Colon cancer Maternal Aunt    SOCIAL HISTORY  Social History   Tobacco Use   Smoking status: Never   Smokeless tobacco: Never  Vaping Use   Vaping status: Never Used  Substance Use Topics   Alcohol use: No   Drug use: No       OPHTHALMIC EXAM:  Not recorded    IMAGING AND PROCEDURES  Imaging and Procedures for 07/17/2024         ASSESSMENT/PLAN: No diagnosis found.  1-3. Diabetes mellitus, type 2 without retinopathy  - last A1c 7.0 (10.31.24) - The incidence, risk factors for progression, natural history and treatment options for diabetic retinopathy  were discussed with patient.   - The need for close monitoring  of blood glucose, blood pressure, and serum lipids, avoiding cigarette or any type of tobacco, and the need for long term follow up was also discussed with patient. - f/u in 1 year, sooner prn  4,5. Hypertensive retinopathy OU - discussed importance of tight BP control - monitor  6. Mixed Cataract OU - The symptoms of cataract, surgical options, and treatments and risks were discussed with patient. - discussed diagnosis and progression - pt has significant PSC OD and reports significant glare symptoms - BCVA 20/40 OD - will refer to [facility] for cataract consult .  Ophthalmic Meds Ordered this visit:  No orders of the defined types were placed in this encounter.    No follow-ups on file.  There are no Patient Instructions on file for this visit.  Explained the diagnoses, plan, and follow up with the patient and they expressed understanding.  Patient expressed understanding of the importance of proper follow up care.   This document serves as a record of services personally performed by Redell JUDITHANN Hans, MD, PhD. It was created on their behalf by Almetta Pesa, an ophthalmic technician. The creation of this record is the provider's dictation and/or activities during the visit.    Electronically signed by: Almetta Pesa, OA, 07/15/24  12:08 PM   Redell JUDITHANN Hans, M.D., Ph.D. Diseases & Surgery of the Retina and Vitreous [facility] 07/17/2024    Abbreviations: M myopia (nearsighted); A astigmatism; H hyperopia (farsighted); P presbyopia; Mrx spectacle prescription;  CTL contact lenses; OD right eye; OS left eye; OU both eyes  XT exotropia; ET esotropia; PEK punctate epithelial keratitis; PEE punctate epithelial erosions; DES dry eye syndrome; MGD meibomian gland dysfunction; ATs artificial tears; PFAT's preservative free artificial tears; NSC nuclear sclerotic cataract; PSC posterior subcapsular cataract; ERM epi-retinal membrane; PVD posterior  vitreous detachment; RD retinal detachment; DM diabetes mellitus; DR diabetic retinopathy; NPDR non-proliferative diabetic retinopathy; PDR proliferative diabetic retinopathy; CSME clinically significant macular edema; DME diabetic macular edema; dbh dot blot hemorrhages; CWS cotton wool spot; POAG primary open angle glaucoma; C/D cup-to-disc ratio; HVF humphrey visual field; GVF goldmann visual field; OCT optical coherence tomography; IOP intraocular pressure; BRVO Branch retinal vein occlusion; CRVO central retinal vein occlusion; CRAO central retinal artery occlusion; BRAO branch retinal artery occlusion; RT retinal tear; SB scleral buckle; PPV pars plana vitrectomy; VH Vitreous hemorrhage; PRP panretinal laser photocoagulation; IVK intravitreal kenalog ; VMT vitreomacular traction; MH Macular hole;  NVD neovascularization of the disc; NVE neovascularization elsewhere; AREDS age related eye disease study; ARMD age related macular degeneration; POAG primary open angle glaucoma; EBMD epithelial/anterior basement membrane dystrophy; ACIOL anterior chamber intraocular lens; IOL intraocular lens; PCIOL posterior chamber intraocular lens; Phaco/IOL phacoemulsification with intraocular lens placement; PRK photorefractive keratectomy; LASIK laser assisted in situ keratomileusis; HTN hypertension; DM diabetes mellitus;  COPD chronic obstructive pulmonary disease  "

## 2024-07-17 ENCOUNTER — Encounter (INDEPENDENT_AMBULATORY_CARE_PROVIDER_SITE_OTHER): Payer: Self-pay

## 2024-07-17 ENCOUNTER — Encounter (INDEPENDENT_AMBULATORY_CARE_PROVIDER_SITE_OTHER): Admitting: Ophthalmology

## 2024-07-17 DIAGNOSIS — Z7984 Long term (current) use of oral hypoglycemic drugs: Secondary | ICD-10-CM

## 2024-07-17 DIAGNOSIS — I10 Essential (primary) hypertension: Secondary | ICD-10-CM

## 2024-07-17 DIAGNOSIS — H25813 Combined forms of age-related cataract, bilateral: Secondary | ICD-10-CM

## 2024-07-17 DIAGNOSIS — Z7985 Long-term (current) use of injectable non-insulin antidiabetic drugs: Secondary | ICD-10-CM

## 2024-07-17 DIAGNOSIS — E119 Type 2 diabetes mellitus without complications: Secondary | ICD-10-CM

## 2024-07-17 DIAGNOSIS — H35033 Hypertensive retinopathy, bilateral: Secondary | ICD-10-CM

## 2024-07-26 ENCOUNTER — Other Ambulatory Visit: Payer: Self-pay | Admitting: Family

## 2024-07-26 DIAGNOSIS — E119 Type 2 diabetes mellitus without complications: Secondary | ICD-10-CM

## 2024-07-26 NOTE — Telephone Encounter (Signed)
 Patient called in about status of refill of Metformn, she has few left,I let her know of 48-72 hrs. She is requesting a rush on this because of inpendingw eather and she is low

## 2024-07-26 NOTE — Telephone Encounter (Signed)
 Complete

## 2024-07-31 NOTE — Progress Notes (Shared)
 " [facility] - Clinic Note  08/07/2024   CHIEF COMPLAINT Patient presents for No chief complaint on file.  HISTORY OF PRESENT ILLNESS: [name] is a 54 y.o. female who presents to the clinic today for:   Pt states   Referring physician: Practice, [facility] [address] [place] [place],  [place] [address]  HISTORICAL INFORMATION:  Selected notes from the MEDICAL RECORD NUMBER Referred by Schuyler Molt, PA-C for DM exam LEE:  Ocular Hx- PMH-   CURRENT MEDICATIONS: No current outpatient medications on file. (Ophthalmic Drugs)   No current facility-administered medications for this visit. (Ophthalmic Drugs)   Current Outpatient Medications (Other)  Medication Sig   amLODipine  (NORVASC ) 5 MG tablet Take 1 tablet (5 mg total) by mouth daily.   escitalopram  (LEXAPRO ) 5 MG tablet TAKE 1 TABLET (5 MG TOTAL) BY MOUTH DAILY.   famotidine  (PEPCID ) 20 MG tablet Take 1 tablet (20 mg total) by mouth 2 (two) times daily.   hydrOXYzine  (VISTARIL ) 25 MG capsule Take 1 capsule (25 mg total) by mouth every 8 (eight) hours as needed. (Patient not taking: Reported on 01/30/2024)   metFORMIN  (GLUCOPHAGE ) 1000 MG tablet TAKE 1 TABLET TWICE A DAY BY ORAL ROUTE FOR 90 DAYS.   metoprolol  succinate (TOPROL  XL) 25 MG 24 hr tablet Take 1 tablet (25 mg total) by mouth at bedtime.   nateglinide (STARLIX) 120 MG tablet Take 120 mg by mouth 3 (three) times daily.   rosuvastatin  (CRESTOR ) 10 MG tablet Take 1 tablet (10 mg total) by mouth daily.   tirzepatide  (MOUNJARO ) 10 MG/0.5ML Pen INJECT 10 MG INTO THE SKIN ONE TIME PER WEEK   triamcinolone  (KENALOG ) 0.025 % cream Apply 1 Application topically 2 (two) times daily.   No current facility-administered medications for this visit. (Other)   REVIEW OF SYSTEMS:   ALLERGIES Allergies  Allergen Reactions   Aloe Other (See Comments)   Bactrim [Sulfamethoxazole -Trimethoprim] Hives   Oxycodone-Acetaminophen  Itching and Other (See Comments)   Percocet [Oxycodone-Acetaminophen] Itching   PAST MEDICAL HISTORY Past Medical History:  Diagnosis Date   Anemia    Anxiety    Diabetes mellitus    Type 2   Fibroid    HLD (hyperlipidemia)    Hypertension    Infection    UTI   Obesity    Panic attack    Vaginal delivery [phone]   Vaginal Pap smear, abnormal    Past Surgical History:  Procedure Laterality Date   KNEE SURGERY Bilateral    x 2 left, 1 right   ROBOTIC ASSISTED TOTAL HYSTERECTOMY WITH SALPINGECTOMY Bilateral 12/22/2015   Procedure: ROBOTIC ASSISTED TOTAL HYSTERECTOMY WITH BILATERAL SALPINGECTOMY;  Surgeon: Elveria Mungo, MD;  Location: [facility];  Service: Gynecology;  Laterality: Bilateral;   FAMILY HISTORY Family History  Problem Relation Age of Onset   Hypertension Mother    Colon polyps Mother    Diabetes Father    Hypertension Father    Diabetes Brother    Hypertension Brother    Clotting disorder Brother    Hypertension Brother    Diabetes Brother    Hypertension Maternal Grandmother    Hypertension Maternal Grandfather    Hypertension Paternal Grandmother    Hypertension Paternal Grandfather    Autism Daughter    Hypertension Daughter    Hypertension Son    Breast cancer Maternal Aunt    Colon cancer Maternal Aunt    SOCIAL HISTORY Social History   Tobacco  Use   Smoking status: Never   Smokeless tobacco: Never  Vaping Use   Vaping status: Never Used  Substance Use Topics   Alcohol use: No   Drug use: No       OPHTHALMIC EXAM:  Not recorded    IMAGING AND PROCEDURES  Imaging and Procedures for 08/07/2024         ASSESSMENT/PLAN: No diagnosis found.  1-3. Diabetes mellitus, type 2 without retinopathy  - last A1c 7.0 (10.31.24) - The incidence, risk factors for progression, natural history and treatment options for diabetic retinopathy  were discussed with patient.   - The need for close monitoring of blood glucose, blood  pressure, and serum lipids, avoiding cigarette or any type of tobacco, and the need for long term follow up was also discussed with patient. - f/u in 1 year, sooner prn  4,5. Hypertensive retinopathy OU - discussed importance of tight BP control - monitor  6. Mixed Cataract OU - The symptoms of cataract, surgical options, and treatments and risks were discussed with patient. - discussed diagnosis and progression - pt has significant PSC OD and reports significant glare symptoms - BCVA 20/40 OD - will refer to [facility] for cataract consult  Ophthalmic Meds Ordered this visit:  No orders of the defined types were placed in this encounter.    No follow-ups on file.  There are no Patient Instructions on file for this visit.  Explained the diagnoses, plan, and follow up with the patient and they expressed understanding.  Patient expressed understanding of the importance of proper follow up care.   This document serves as a record of services personally performed by Redell JUDITHANN Hans, MD, PhD. It was created on their behalf by Almetta Pesa, an ophthalmic technician. The creation of this record is the provider's dictation and/or activities during the visit.    Electronically signed by: Almetta Pesa, OA, 07/31/24  12:29 PM    Redell JUDITHANN Hans, M.D., Ph.D. Diseases & Surgery of the Retina and Vitreous [facility] 08/07/2024    Abbreviations: M myopia (nearsighted); A astigmatism; H hyperopia (farsighted); P presbyopia; Mrx spectacle prescription;  CTL contact lenses; OD right eye; OS left eye; OU both eyes  XT exotropia; ET esotropia; PEK punctate epithelial keratitis; PEE punctate epithelial erosions; DES dry eye syndrome; MGD meibomian gland dysfunction; ATs artificial tears; PFAT's preservative free artificial tears; NSC nuclear sclerotic cataract; PSC posterior subcapsular cataract; ERM epi-retinal membrane; PVD posterior vitreous detachment; RD  retinal detachment; DM diabetes mellitus; DR diabetic retinopathy; NPDR non-proliferative diabetic retinopathy; PDR proliferative diabetic retinopathy; CSME clinically significant macular edema; DME diabetic macular edema; dbh dot blot hemorrhages; CWS cotton wool spot; POAG primary open angle glaucoma; C/D cup-to-disc ratio; HVF humphrey visual field; GVF goldmann visual field; OCT optical coherence tomography; IOP intraocular pressure; BRVO Branch retinal vein occlusion; CRVO central retinal vein occlusion; CRAO central retinal artery occlusion; BRAO branch retinal artery occlusion; RT retinal tear; SB scleral buckle; PPV pars plana vitrectomy; VH Vitreous hemorrhage; PRP panretinal laser photocoagulation; IVK intravitreal kenalog ; VMT vitreomacular traction; MH Macular hole;  NVD neovascularization of the disc; NVE neovascularization elsewhere; AREDS age related eye disease study; ARMD age related macular degeneration; POAG primary open angle glaucoma; EBMD epithelial/anterior basement membrane dystrophy; ACIOL anterior chamber intraocular lens; IOL intraocular lens; PCIOL posterior chamber intraocular lens; Phaco/IOL phacoemulsification with intraocular lens placement; PRK photorefractive keratectomy; LASIK laser assisted in situ keratomileusis; HTN hypertension; DM diabetes mellitus; COPD chronic obstructive pulmonary disease  "

## 2024-08-07 ENCOUNTER — Encounter (INDEPENDENT_AMBULATORY_CARE_PROVIDER_SITE_OTHER): Admitting: Ophthalmology

## 2024-08-07 DIAGNOSIS — Z7984 Long term (current) use of oral hypoglycemic drugs: Secondary | ICD-10-CM

## 2024-08-07 DIAGNOSIS — H25813 Combined forms of age-related cataract, bilateral: Secondary | ICD-10-CM

## 2024-08-07 DIAGNOSIS — H35033 Hypertensive retinopathy, bilateral: Secondary | ICD-10-CM

## 2024-08-07 DIAGNOSIS — E119 Type 2 diabetes mellitus without complications: Secondary | ICD-10-CM

## 2024-08-07 DIAGNOSIS — Z7985 Long-term (current) use of injectable non-insulin antidiabetic drugs: Secondary | ICD-10-CM

## 2024-08-07 DIAGNOSIS — I10 Essential (primary) hypertension: Secondary | ICD-10-CM

## 2024-09-13 ENCOUNTER — Encounter

## 2024-09-13 DIAGNOSIS — Z1231 Encounter for screening mammogram for malignant neoplasm of breast: Secondary | ICD-10-CM

## 2024-12-03 ENCOUNTER — Encounter: Admitting: Family
